# Patient Record
Sex: Female | Born: 1935 | Race: White | Hispanic: No | Marital: Married | Smoking: Current every day smoker
Health system: Southern US, Community
[De-identification: ages and names within clinical notes are randomized; demographics above are authoritative.]

## PROBLEM LIST (undated history)

## (undated) DIAGNOSIS — Z5189 Encounter for other specified aftercare: Secondary | ICD-10-CM

## (undated) DIAGNOSIS — M1990 Unspecified osteoarthritis, unspecified site: Secondary | ICD-10-CM

## (undated) DIAGNOSIS — IMO0001 Reserved for inherently not codable concepts without codable children: Secondary | ICD-10-CM

## (undated) DIAGNOSIS — H269 Unspecified cataract: Secondary | ICD-10-CM

## (undated) DIAGNOSIS — E785 Hyperlipidemia, unspecified: Secondary | ICD-10-CM

## (undated) HISTORY — DX: Unspecified cataract: H26.9

## (undated) HISTORY — DX: Encounter for other specified aftercare: Z51.89

## (undated) HISTORY — DX: Unspecified osteoarthritis, unspecified site: M19.90

## (undated) HISTORY — DX: Reserved for inherently not codable concepts without codable children: IMO0001

## (undated) HISTORY — DX: Hyperlipidemia, unspecified: E78.5

---

## 1983-12-01 HISTORY — PX: VAGINAL HYSTERECTOMY: SUR661

## 1998-09-27 ENCOUNTER — Other Ambulatory Visit: Admission: RE | Admit: 1998-09-27 | Discharge: 1998-09-27 | Payer: Self-pay | Admitting: Obstetrics and Gynecology

## 1999-10-29 ENCOUNTER — Other Ambulatory Visit: Admission: RE | Admit: 1999-10-29 | Discharge: 1999-10-29 | Payer: Self-pay | Admitting: Obstetrics and Gynecology

## 2000-08-27 ENCOUNTER — Encounter: Payer: Self-pay | Admitting: Internal Medicine

## 2000-08-27 ENCOUNTER — Encounter: Admission: RE | Admit: 2000-08-27 | Discharge: 2000-08-27 | Payer: Self-pay | Admitting: Internal Medicine

## 2000-11-11 ENCOUNTER — Other Ambulatory Visit: Admission: RE | Admit: 2000-11-11 | Discharge: 2000-11-11 | Payer: Self-pay | Admitting: Obstetrics and Gynecology

## 2001-08-12 ENCOUNTER — Encounter: Admission: RE | Admit: 2001-08-12 | Discharge: 2001-08-12 | Payer: Self-pay | Admitting: Internal Medicine

## 2001-08-12 ENCOUNTER — Encounter: Payer: Self-pay | Admitting: Internal Medicine

## 2001-11-11 ENCOUNTER — Other Ambulatory Visit: Admission: RE | Admit: 2001-11-11 | Discharge: 2001-11-11 | Payer: Self-pay | Admitting: Obstetrics and Gynecology

## 2002-09-05 ENCOUNTER — Other Ambulatory Visit: Admission: RE | Admit: 2002-09-05 | Discharge: 2002-09-05 | Payer: Self-pay | Admitting: Obstetrics and Gynecology

## 2004-06-18 ENCOUNTER — Other Ambulatory Visit: Admission: RE | Admit: 2004-06-18 | Discharge: 2004-06-18 | Payer: Self-pay | Admitting: Obstetrics and Gynecology

## 2004-10-07 ENCOUNTER — Encounter (INDEPENDENT_AMBULATORY_CARE_PROVIDER_SITE_OTHER): Payer: Self-pay | Admitting: *Deleted

## 2004-10-07 ENCOUNTER — Ambulatory Visit (HOSPITAL_COMMUNITY): Admission: RE | Admit: 2004-10-07 | Discharge: 2004-10-07 | Payer: Self-pay | Admitting: *Deleted

## 2006-10-07 ENCOUNTER — Other Ambulatory Visit: Admission: RE | Admit: 2006-10-07 | Discharge: 2006-10-07 | Payer: Self-pay | Admitting: Obstetrics and Gynecology

## 2009-05-15 ENCOUNTER — Other Ambulatory Visit: Admission: RE | Admit: 2009-05-15 | Discharge: 2009-05-15 | Payer: Self-pay | Admitting: Obstetrics and Gynecology

## 2009-05-15 ENCOUNTER — Encounter: Payer: Self-pay | Admitting: Obstetrics and Gynecology

## 2009-05-15 ENCOUNTER — Ambulatory Visit: Payer: Self-pay | Admitting: Obstetrics and Gynecology

## 2009-05-23 ENCOUNTER — Ambulatory Visit (HOSPITAL_COMMUNITY): Admission: RE | Admit: 2009-05-23 | Discharge: 2009-05-23 | Payer: Self-pay | Admitting: *Deleted

## 2009-05-23 ENCOUNTER — Encounter (INDEPENDENT_AMBULATORY_CARE_PROVIDER_SITE_OTHER): Payer: Self-pay | Admitting: *Deleted

## 2009-06-07 ENCOUNTER — Ambulatory Visit: Payer: Self-pay | Admitting: Obstetrics and Gynecology

## 2009-07-17 ENCOUNTER — Ambulatory Visit: Payer: Self-pay | Admitting: Obstetrics and Gynecology

## 2011-03-13 ENCOUNTER — Encounter (INDEPENDENT_AMBULATORY_CARE_PROVIDER_SITE_OTHER): Payer: Medicare Other | Admitting: Obstetrics and Gynecology

## 2011-03-13 DIAGNOSIS — N3941 Urge incontinence: Secondary | ICD-10-CM

## 2011-03-13 DIAGNOSIS — M810 Age-related osteoporosis without current pathological fracture: Secondary | ICD-10-CM

## 2011-03-13 DIAGNOSIS — N390 Urinary tract infection, site not specified: Secondary | ICD-10-CM

## 2011-03-13 DIAGNOSIS — N952 Postmenopausal atrophic vaginitis: Secondary | ICD-10-CM

## 2011-04-14 NOTE — Op Note (Signed)
NAMESABREE, NUON                ACCOUNT NO.:  [id number]   MEDICAL RECORD NO.:  [id number]          PATIENT TYPE:  AMB   LOCATION:  ENDO                         FACILITY:  [facility]   PHYSICIAN:  Georgiana Spinner, M.D.    DATE OF BIRTH:  December 16, 1935   DATE OF PROCEDURE:  05/23/2009  DATE OF DISCHARGE:                               OPERATIVE REPORT   PROCEDURE:  Colonoscopy.   INDICATIONS:  Rectal bleeding.   ANESTHESIA:  Fentanyl 100 mcg, Versed 10 mg.   PROCEDURE:  With the patient mildly sedated in the lateral decubitus  position, the Pentax videoscopic pediatric colonoscope was inserted in  the rectum and passed under direct vision with pressure applied to reach  the cecum, identified by ileocecal valve and appendiceal orifice, both  of which were photographed.  From this point, the colonoscope was slowly  withdrawn, taking circumferential views of colonic mucosa as we withdrew  all the way to the rectum, stopping first in the ascending colon where a  small polyp was seen, photographed, and removed using hot biopsy forceps  technique setting of 20/150 blended current.  We next stopped in the  descending colon distally where a polyp was seen.  It too was  photographed and it too was removed in the same manner.  The endoscope  was pulled back in the rectum which appeared normal on direct and showed  large hemorrhoids on retroflexed view.  The endoscope was straightened  and withdrawn.  The patient's vital signs and pulse oximeter remained  stable.  The patient tolerated the procedure well without apparent  complications.   FINDINGS:  Large internal hemorrhoids, quite possibly the cause of the  patient's rectal bleeding as noted clinically.   PLAN:  Await biopsy report.  The patient will call for results and  follow up with me as needed as an outpatient with consideration for  therapy directed at the hemorrhoids as needed.           ______________________________  Georgiana Spinner,  M.D.     GMO/MEDQ  D:  05/23/2009  T:  05/23/2009  Job:  [id number]

## 2011-04-17 NOTE — Op Note (Signed)
NAMEZIYA, COONROD                ACCOUNT NO.:  [id number]   MEDICAL RECORD NO.:  [id number]          PATIENT TYPE:  AMB   LOCATION:  ENDO                         FACILITY:  [facility]   PHYSICIAN:  Georgiana Spinner, M.D.    DATE OF BIRTH:  [birth year]   DATE OF PROCEDURE:  10/07/2004  DATE OF DISCHARGE:                                 OPERATIVE REPORT   PROCEDURE:  Colonoscopy.   INDICATIONS:  Rectal bleeding.   ANESTHESIA:  Demerol 20 mg, Versed 2 mg.   PROCEDURE:  With the patient mildly sedated in the left lateral decubitus  position, the Olympus videoscopic colonoscope was inserted into the rectum,  passed under direct vision to the cecum, identified by the ileocecal valve  and appendiceal orifice, both of which were photographed.  From this point  the colonoscope was slowly withdrawn, taking circumferential views of the  colonic mucosa, stopping only in the rectum, which appeared normal in direct  view but showed hemorrhoids in the anal canal and on retroflexed view.  The  endoscope was straightened and withdrawn.  The patient's vital signs and  pulse oximetry remained stable.  The patient tolerated the procedure well  without apparent complications.   FINDINGS:  Hemorrhoids, otherwise unremarkable examination.   PLAN:  See endoscopy note for further details.       GMO/MEDQ  D:  10/07/2004  T:  10/07/2004  Job:  [id number]

## 2011-04-17 NOTE — Op Note (Signed)
[name], Kara Campos                ACCOUNT NO.:  [id number]   MEDICAL RECORD NO.:  [id number]          PATIENT TYPE:  AMB   LOCATION:  ENDO                         FACILITY:  [facility]   PHYSICIAN:  Georgiana Spinner, M.D.    DATE OF BIRTH:  [birth year]   DATE OF PROCEDURE:  10/07/2004  DATE OF DISCHARGE:                                 OPERATIVE REPORT   PROCEDURE:  Upper endoscopy with biopsy.   INDICATIONS:  Abdominal pain.   ANESTHESIA:  Demerol 50 mg, Versed 5 mg.   PROCEDURE:  With the patient mildly sedated in the left lateral decubitus  position, the Olympus videoscopic endoscope was inserted in the mouth,  passed under direct vision through the esophagus, which appeared normal.  There was a small hiatal hernia which was photographed, but no evidence of  Barrett's esophagus was noted.  We entered into the stomach.  Fundus, body,  antrum, duodenal bulb, second portion of the duodenum were visualized.  From  this point the endoscope was slowly withdrawn taking circumferential views  of the duodenal mucosa until the endoscope had been pulled back into the  stomach, placed in retroflexion to view the stomach from below.  The  endoscope was straightened and withdrawn, taking circumferential views of  the remaining gastric and esophageal mucosa, stopping in the body and fundus  of the stomach, where changes of a diffuse carpet-like erythema were noted  and photographed and biopsied.  The patient's vital signs and pulse oximetry  remained stable.  The patient tolerated the procedure well without apparent  complications.   FINDINGS:  Erythematous changes of stomach, biopsied.   Await biopsy report.  The patient will call me for results and follow up  with me as an outpatient.  Proceed to colonoscopy as planned.       GMO/MEDQ  D:  10/07/2004  T:  10/07/2004  Job:  [id number]

## 2011-12-07 DIAGNOSIS — I10 Essential (primary) hypertension: Secondary | ICD-10-CM | POA: Diagnosis not present

## 2011-12-07 DIAGNOSIS — E789 Disorder of lipoprotein metabolism, unspecified: Secondary | ICD-10-CM | POA: Diagnosis not present

## 2011-12-07 DIAGNOSIS — E119 Type 2 diabetes mellitus without complications: Secondary | ICD-10-CM | POA: Diagnosis not present

## 2011-12-14 DIAGNOSIS — E7800 Pure hypercholesterolemia, unspecified: Secondary | ICD-10-CM | POA: Diagnosis not present

## 2011-12-14 DIAGNOSIS — R7309 Other abnormal glucose: Secondary | ICD-10-CM | POA: Diagnosis not present

## 2012-01-11 ENCOUNTER — Encounter (INDEPENDENT_AMBULATORY_CARE_PROVIDER_SITE_OTHER): Payer: Self-pay | Admitting: General Surgery

## 2012-01-11 ENCOUNTER — Ambulatory Visit (INDEPENDENT_AMBULATORY_CARE_PROVIDER_SITE_OTHER): Payer: Medicare Other | Admitting: General Surgery

## 2012-01-11 VITALS — BP 136/84 | HR 69 | Temp 97.4°F | Ht 61.0 in | Wt 113.0 lb

## 2012-01-11 DIAGNOSIS — K648 Other hemorrhoids: Secondary | ICD-10-CM | POA: Diagnosis not present

## 2012-01-11 MED ORDER — HYDROCORTISONE 2.5 % RE CREA: TOPICAL_CREAM | Freq: Two times a day (BID) | RECTAL | Status: AC

## 2012-01-11 MED ORDER — NYSTATIN 100000 UNIT/GM EX CREA: TOPICAL_CREAM | Freq: Two times a day (BID) | CUTANEOUS | Status: AC

## 2012-01-11 NOTE — Progress Notes (Signed)
Subjective:     Patient ID: Kara Campos, female   DOB: 02/13/1936, 76 y.o.   MRN: [id number]  HPI Pt did well after banding of hemorrhoids 1 year ago until 2 weeks ago.  She had an acute episode of pain, with a hard palpable hemorrhoid that she had to push back in.  Since that time, she has had irritation, burning, and itching around her anus.  Witch hazel wipes made the pain much worse.  She has been doing sitz baths.  She denies constipation, but does have multiple loose stools/day.    Review of Systems Otherwise negative.     Objective:   Physical Exam  Constitutional: She is oriented to person, place, and time. She appears well-developed and well-nourished. No distress.  HENT:  Head: Normocephalic and atraumatic.  Eyes: Pupils are equal, round, and reactive to light. No scleral icterus.  Cardiovascular: Normal rate.   Pulmonary/Chest: Effort normal. No respiratory distress.  Abdominal: Soft.  Genitourinary: Rectal exam shows internal hemorrhoid. Rectal exam shows no external hemorrhoid.          Circumferential excoriations and bright red skin around anus.  Musculoskeletal: Normal range of motion.  Neurological: She is alert and oriented to person, place, and time. Coordination normal.  Skin: Skin is warm and dry. She is not diaphoretic.  Psychiatric: She has a normal mood and affect. Her behavior is normal. Judgment and thought content normal.       Assessment/Plan:     Hemorrhoids, internal, with complication Pt with likely thrombosed hemorrhoid and prolapse 2 weeks ago from description Now pt with excoriated skin around anus posteriorly and anteriorly.  Injected posterior and anterior right hemorrhoid.  Will do nystatin cream to excoriated area and hold on hemorrhoid cream until skin is healed.    Pt with loose stools instead of hard stools.   Advised to clean with squirt bottle and/or wipes and to dry with blow dryer.  Will follow up in 4-6 weeks.

## 2012-01-11 NOTE — Patient Instructions (Signed)
Avoid wiping around your anus significantly  Use water bottle/sitz baths or wipes to clean  Use hair dryer to dry.    Apply nystatin cream around anus.  If no relief from the burning/itching in 1 week, switch to Desitin/A&D or similar over the counter zinc oxide cream  Do not use the anusol (hemorrhoid cream) until itching/burning resolved.

## 2012-01-11 NOTE — Assessment & Plan Note (Signed)
Pt with likely thrombosed hemorrhoid and prolapse 2 weeks ago from description Now pt with excoriated skin around anus posteriorly and anteriorly.  Injected posterior and anterior right hemorrhoid.  Will do nystatin cream to excoriated area and hold on hemorrhoid cream until skin is healed.    Pt with loose stools instead of hard stools.   Advised to clean with squirt bottle and/or wipes and to dry with blow dryer.  Will follow up in 4-6 weeks.

## 2012-02-08 DIAGNOSIS — N390 Urinary tract infection, site not specified: Secondary | ICD-10-CM | POA: Diagnosis not present

## 2012-02-08 DIAGNOSIS — R300 Dysuria: Secondary | ICD-10-CM | POA: Diagnosis not present

## 2012-02-09 ENCOUNTER — Telehealth (INDEPENDENT_AMBULATORY_CARE_PROVIDER_SITE_OTHER): Payer: Self-pay | Admitting: General Surgery

## 2012-02-09 NOTE — Telephone Encounter (Signed)
Pt calling with problems of area still red and inflammed.  She stopped the zinc oxide (too messy) and has been using the Nystatin and A&D ointment only.  While the skin is still red and excoriated, she was instructed not to use the proctosol.  She reports pain and itching really badly.  I recommended she reconsider the zinc oxide and generously apply to the excoriated skin to provide a complete barrier between the skin and any urine or feces.  The zinc properties will help heal the skin as well.  The patient stated she will try the zinc oxide again.

## 2012-02-25 ENCOUNTER — Encounter (INDEPENDENT_AMBULATORY_CARE_PROVIDER_SITE_OTHER): Payer: Medicare Other | Admitting: General Surgery

## 2012-02-29 ENCOUNTER — Encounter (INDEPENDENT_AMBULATORY_CARE_PROVIDER_SITE_OTHER): Payer: Self-pay | Admitting: General Surgery

## 2012-02-29 ENCOUNTER — Ambulatory Visit (INDEPENDENT_AMBULATORY_CARE_PROVIDER_SITE_OTHER): Payer: Medicare Other | Admitting: General Surgery

## 2012-02-29 VITALS — BP 138/73 | HR 68 | Temp 97.7°F | Ht 61.0 in | Wt 112.6 lb

## 2012-02-29 DIAGNOSIS — K648 Other hemorrhoids: Secondary | ICD-10-CM

## 2012-02-29 MED ORDER — HYDROCORTISONE ACETATE 25 MG RE SUPP: 25.0000 mg | Freq: Two times a day (BID) | RECTAL | Status: DC

## 2012-02-29 NOTE — Patient Instructions (Signed)
Use suppositories twice daily.  Use proctosol cream at the outside anus.  Continue nystatin cream to R buttock.

## 2012-02-29 NOTE — Progress Notes (Signed)
HISTORY: Pt is doing much better overall than at last visit.  She has been using zinc oxide barrier cream and nystatin to perianal area.  Has had difficulty with the proctosol applicator.     PERTINENT REVIEW OF SYSTEMS: Otherwise negative.     EXAM: Head: Normocephalic and atraumatic.  Eyes:  Conjunctivae are normal. Pupils are equal, round, and reactive to light. No scleral icterus.  Neck:  Normal range of motion. Neck supple. No tracheal deviation present. No thyromegaly present.  Resp: No respiratory distress, normal effort. Rectal:  Small circumferential external hemorrhoids.  On Anoscopy, large right sided internal hemorrhoids, injected.  Excoriations around anus are mostly gone.  Small amount of candidiasis on R buttock.   Neurological: Alert and oriented to person, place, and time. Coordination normal.  Skin: Skin is warm and dry. No rash noted. No diaphoretic. No erythema. No pallor.  Psychiatric: Normal mood and affect. Normal behavior. Judgment and thought content normal.      ASSESSMENT AND PLAN:   Hemorrhoids, internal, with complication Continue Proctosol at anus. Try suppositories for internal hemorrhoids. Follow up in 6-8 weeks.    Continue nystatin of candidiasis of R gluteal area.         Maudry Diego, MD Surgical Oncology, General & Endocrine Surgery [facility], Hubbard Hartshorn, MD, MD Massie Maroon., MD

## 2012-02-29 NOTE — Assessment & Plan Note (Signed)
Continue Proctosol at anus. Try suppositories for internal hemorrhoids. Follow up in 6-8 weeks.    Continue nystatin of candidiasis of R gluteal area.

## 2012-03-30 DIAGNOSIS — I10 Essential (primary) hypertension: Secondary | ICD-10-CM | POA: Diagnosis not present

## 2012-03-30 DIAGNOSIS — E7800 Pure hypercholesterolemia, unspecified: Secondary | ICD-10-CM | POA: Diagnosis not present

## 2012-04-04 DIAGNOSIS — I10 Essential (primary) hypertension: Secondary | ICD-10-CM | POA: Diagnosis not present

## 2012-04-04 DIAGNOSIS — R7309 Other abnormal glucose: Secondary | ICD-10-CM | POA: Diagnosis not present

## 2012-04-04 DIAGNOSIS — E7800 Pure hypercholesterolemia, unspecified: Secondary | ICD-10-CM | POA: Diagnosis not present

## 2012-04-04 DIAGNOSIS — E559 Vitamin D deficiency, unspecified: Secondary | ICD-10-CM | POA: Diagnosis not present

## 2012-04-21 DIAGNOSIS — N390 Urinary tract infection, site not specified: Secondary | ICD-10-CM | POA: Diagnosis not present

## 2012-04-21 DIAGNOSIS — R300 Dysuria: Secondary | ICD-10-CM | POA: Diagnosis not present

## 2012-04-27 DIAGNOSIS — R109 Unspecified abdominal pain: Secondary | ICD-10-CM | POA: Diagnosis not present

## 2012-05-04 ENCOUNTER — Encounter (INDEPENDENT_AMBULATORY_CARE_PROVIDER_SITE_OTHER): Payer: Self-pay | Admitting: General Surgery

## 2012-05-04 ENCOUNTER — Ambulatory Visit (INDEPENDENT_AMBULATORY_CARE_PROVIDER_SITE_OTHER): Payer: Medicare Other | Admitting: General Surgery

## 2012-05-04 VITALS — BP 126/80 | HR 66 | Temp 97.5°F | Resp 14 | Ht 61.0 in | Wt 113.6 lb

## 2012-05-04 DIAGNOSIS — K644 Residual hemorrhoidal skin tags: Secondary | ICD-10-CM | POA: Diagnosis not present

## 2012-05-04 NOTE — Progress Notes (Signed)
HISTORY: Pt has stalled and doesn't feel like she is improving.  She is not having bleeding, but continues to have itching and burning at her anus.  Her anus also feels swollen.  She has been doing suppositories in AM, and anusol in PM.  She has not been having as much itching on her buttocks.  She ran out of her nystatin, and has been using A&D ointment.     PERTINENT REVIEW OF SYSTEMS: Otherwise negative.     EXAM: Head: Normocephalic and atraumatic.  Eyes:  Conjunctivae are normal. Pupils are equal, round, and reactive to light. No scleral icterus.  Neck:  Normal range of motion. Neck supple. No tracheal deviation present. No thyromegaly present.  Resp: No respiratory distress, normal effort. Rectal: Small circumferential external hemorrhoids, swollen and inflamed.  Small amount of candidiasis on R buttock.   Neurological: Alert and oriented to person, place, and time. Coordination normal.  Skin: Skin is warm and dry. No rash noted. No diaphoretic. No erythema. No pallor.  Psychiatric: Normal mood and affect. Normal behavior. Judgment and thought content normal.      ASSESSMENT AND PLAN:   External hemorrhoids Now with circumferential external hemorrhoids.   All are small, but swollen.  Advised to use anusol TID and increase stool softeners.  Did not complain of bleeding or prolapse, so did not repeat anoscopy today.    Follow up in 6-8 weeks.    Area of candidiasis looks better.  Would continue A&D on gluteal skin.        Maudry Diego, MD Surgical Oncology, General & Endocrine Surgery [facility], Hubbard Hartshorn, MD, MD Massie Maroon., MD

## 2012-05-04 NOTE — Assessment & Plan Note (Signed)
Now with circumferential external hemorrhoids.   All are small, but swollen.  Advised to use anusol TID and increase stool softeners.  Did not complain of bleeding or prolapse, so did not repeat anoscopy today.    Follow up in 6-8 weeks.    Area of candidiasis looks better.  Would continue A&D on gluteal skin.

## 2012-05-04 NOTE — Patient Instructions (Signed)
Increase hydrocortisone cream to 3 times/day.  Increase stool softener to twice daily.  Follow up in 8 weeks.    Call if recurrent itching, we can refill nystatin.

## 2012-05-09 DIAGNOSIS — R350 Frequency of micturition: Secondary | ICD-10-CM | POA: Diagnosis not present

## 2012-05-09 DIAGNOSIS — N3020 Other chronic cystitis without hematuria: Secondary | ICD-10-CM | POA: Diagnosis not present

## 2012-05-26 DIAGNOSIS — H4011X0 Primary open-angle glaucoma, stage unspecified: Secondary | ICD-10-CM | POA: Diagnosis not present

## 2012-05-26 DIAGNOSIS — H409 Unspecified glaucoma: Secondary | ICD-10-CM | POA: Diagnosis not present

## 2012-06-24 ENCOUNTER — Encounter (INDEPENDENT_AMBULATORY_CARE_PROVIDER_SITE_OTHER): Payer: Self-pay | Admitting: General Surgery

## 2012-06-24 ENCOUNTER — Ambulatory Visit (INDEPENDENT_AMBULATORY_CARE_PROVIDER_SITE_OTHER): Payer: Medicare Other | Admitting: General Surgery

## 2012-06-24 VITALS — BP 148/86 | HR 72 | Temp 97.9°F | Resp 16 | Ht 61.0 in | Wt 112.8 lb

## 2012-06-24 DIAGNOSIS — K644 Residual hemorrhoidal skin tags: Secondary | ICD-10-CM | POA: Diagnosis not present

## 2012-06-24 DIAGNOSIS — B3789 Other sites of candidiasis: Secondary | ICD-10-CM | POA: Diagnosis not present

## 2012-06-24 DIAGNOSIS — R197 Diarrhea, unspecified: Secondary | ICD-10-CM | POA: Insufficient documentation

## 2012-06-24 MED ORDER — CLOTRIMAZOLE-BETAMETHASONE 1-0.05 % EX CREA: TOPICAL_CREAM | CUTANEOUS | Status: AC

## 2012-06-24 NOTE — Assessment & Plan Note (Signed)
?   Irritable bowel syndrome.  Related to mental stress and anxiety.  If no improvement, would refer to GI.

## 2012-06-24 NOTE — Patient Instructions (Signed)
Stop all other creams.  Apply clotrimazole to perianal area and gluteal region.  Follow up in 4-6 weeks.

## 2012-06-24 NOTE — Assessment & Plan Note (Signed)
Improved on the anusol cream.  Seems symptoms are more related to candidiasis.    Will hold anusol cream.

## 2012-06-24 NOTE — Assessment & Plan Note (Signed)
Pt with continued appearance of yeast.  Will switch to clotrimazole.  If no improvement, will refer to dermatology

## 2012-06-24 NOTE — Progress Notes (Signed)
HISTORY: Pt continues to have fiery pain on buttock and perianal area.  She is having less swelling. She continues to to have flaky skin and burning.  She has started to have diarrhea secondary to severe anxiety.     PERTINENT REVIEW OF SYSTEMS: Otherwise negative.     EXAM: Head: Normocephalic and atraumatic.  Eyes:  Conjunctivae are normal. Pupils are equal, round, and reactive to light. No scleral icterus.  Neck:  Normal range of motion. Neck supple. No tracheal deviation present. No thyromegaly present.  Resp: No respiratory distress, normal effort. Rectal: Small circumferential external hemorrhoids, much less inflamed.  .  Small amount of candidiasis on R buttock, more red than last eval.  .   Neurological: Alert and oriented to person, place, and time. Coordination normal.  Skin: Skin is warm and dry. No rash noted. No diaphoretic. No erythema. No pallor.  Psychiatric: Normal mood and affect. Normal behavior. Judgment and thought content normal.      ASSESSMENT AND PLAN:   Candidiasis of anus and buttock Pt with continued appearance of yeast.  Will switch to clotrimazole.  If no improvement, will refer to dermatology  External hemorrhoids Improved on the anusol cream.  Seems symptoms are more related to candidiasis.    Will hold anusol cream.    Diarrhea ? Irritable bowel syndrome.  Related to mental stress and anxiety.  If no improvement, would refer to GI.       Maudry Diego, MD Surgical Oncology, General & Endocrine Surgery [facility] Surgery, Hubbard Hartshorn, MD Massie Maroon., MD

## 2012-07-14 DIAGNOSIS — H2510 Age-related nuclear cataract, unspecified eye: Secondary | ICD-10-CM | POA: Diagnosis not present

## 2012-08-02 ENCOUNTER — Ambulatory Visit (INDEPENDENT_AMBULATORY_CARE_PROVIDER_SITE_OTHER): Payer: Medicare Other | Admitting: General Surgery

## 2012-08-02 ENCOUNTER — Encounter (INDEPENDENT_AMBULATORY_CARE_PROVIDER_SITE_OTHER): Payer: Self-pay | Admitting: General Surgery

## 2012-08-02 VITALS — BP 112/72 | HR 68 | Temp 97.2°F | Resp 18 | Ht 61.0 in | Wt 112.0 lb

## 2012-08-02 DIAGNOSIS — B3789 Other sites of candidiasis: Secondary | ICD-10-CM | POA: Diagnosis not present

## 2012-08-02 NOTE — Assessment & Plan Note (Signed)
Hold off on hemorrhoid cream for now.  May resume if symptoms flare up.

## 2012-08-02 NOTE — Assessment & Plan Note (Signed)
Continue lotrimin for another week.  Then switch to hydrating cream.    Follow up as needed.

## 2012-08-02 NOTE — Progress Notes (Signed)
HISTORY: Pt is doing better.  Pt has less itching and burning.  She is also not complaining of hemorrhoid discomfort.  [name]     PERTINENT REVIEW OF SYSTEMS: Otherwise negative.     EXAM: Head: Normocephalic and atraumatic.  Eyes:  Conjunctivae are normal. Pupils are equal, round, and reactive to light. No scleral icterus.  Neck:  Normal range of motion. Neck supple. No tracheal deviation present. No thyromegaly present.  Resp: No respiratory distress, normal effort. Rectal: Small circumferential external hemorrhoids, minimally inflamed.  Small amount of candidiasis on R buttock, more red than last eval.  .   Neurological: Alert and oriented to person, place, and time. Coordination normal.  Skin: Skin is warm and dry. Erythematous maculopapular rash over shins.  No diaphoretic. No erythema. No pallor.  Psychiatric: Normal mood and affect. Normal behavior. Judgment and thought content normal.      ASSESSMENT AND PLAN:   Hemorrhoids, internal, with complication Hold off on hemorrhoid cream for now.  May resume if symptoms flare up.    Candidiasis of anus and buttock Continue lotrimin for another week.  Then switch to hydrating cream.    Follow up as needed.        Maudry Diego, MD Surgical Oncology, General & Endocrine Surgery [facility] Surgery, Hubbard Hartshorn, MD Massie Maroon., MD

## 2012-08-02 NOTE — Patient Instructions (Signed)
See dermatologist.  Try hydrating lotion such as lotrimin.

## 2012-08-12 ENCOUNTER — Telehealth (INDEPENDENT_AMBULATORY_CARE_PROVIDER_SITE_OTHER): Payer: Self-pay

## 2012-08-12 NOTE — Telephone Encounter (Signed)
Pt has appt with Dr. Leonie Man at [facility] on 01/20/2013 at 2:20pm.  Pt is aware and may call to check for cancellations.

## 2012-08-17 DIAGNOSIS — Z23 Encounter for immunization: Secondary | ICD-10-CM | POA: Diagnosis not present

## 2012-10-06 DIAGNOSIS — R7309 Other abnormal glucose: Secondary | ICD-10-CM | POA: Diagnosis not present

## 2012-10-06 DIAGNOSIS — I10 Essential (primary) hypertension: Secondary | ICD-10-CM | POA: Diagnosis not present

## 2012-10-06 DIAGNOSIS — E559 Vitamin D deficiency, unspecified: Secondary | ICD-10-CM | POA: Diagnosis not present

## 2012-10-11 DIAGNOSIS — I10 Essential (primary) hypertension: Secondary | ICD-10-CM | POA: Diagnosis not present

## 2012-10-11 DIAGNOSIS — E7800 Pure hypercholesterolemia, unspecified: Secondary | ICD-10-CM | POA: Diagnosis not present

## 2012-10-11 DIAGNOSIS — R7309 Other abnormal glucose: Secondary | ICD-10-CM | POA: Diagnosis not present

## 2012-10-11 DIAGNOSIS — E559 Vitamin D deficiency, unspecified: Secondary | ICD-10-CM | POA: Diagnosis not present

## 2012-11-28 ENCOUNTER — Ambulatory Visit (INDEPENDENT_AMBULATORY_CARE_PROVIDER_SITE_OTHER): Payer: Medicare Other | Admitting: Gynecology

## 2012-11-28 ENCOUNTER — Encounter: Payer: Self-pay | Admitting: Gynecology

## 2012-11-28 VITALS — BP 118/78 | Ht 60.0 in | Wt 114.5 lb

## 2012-11-28 DIAGNOSIS — N952 Postmenopausal atrophic vaginitis: Secondary | ICD-10-CM | POA: Diagnosis not present

## 2012-11-28 DIAGNOSIS — R21 Rash and other nonspecific skin eruption: Secondary | ICD-10-CM | POA: Diagnosis not present

## 2012-11-28 DIAGNOSIS — M810 Age-related osteoporosis without current pathological fracture: Secondary | ICD-10-CM | POA: Diagnosis not present

## 2012-11-28 NOTE — Patient Instructions (Signed)
Call to Schedule your mammogram  Facilities in [place]: 1)  [facility] [place], [place] Phone: [phone] 2)  [facility], [phone] Phone: [phone] 3)  Dr. Yolanda Bonine at [facility] [address] Phone: [phone]     Mammogram A mammogram is an X-ray test to find changes in a woman's breast. You should get a mammogram if:  You are 23 years of age or older  You have risk factors.   Your doctor recommends that you have one.  BEFORE THE TEST  Do not schedule the test the week before your period, especially if your breasts are sore during this time.  On the day of your mammogram:  Wash your breasts and armpits well. After washing, do not put on any deodorant or talcum powder on until after your test.   Eat and drink as you usually do.   Take your medicines as usual.   If you are diabetic and take insulin, make sure you:   Eat before coming for your test.   Take your insulin as usual.   If you cannot keep your appointment, call before the appointment to cancel. Schedule another appointment.  TEST  You will need to undress from the waist up. You will put on a hospital gown.   Your breast will be put on the mammogram machine, and it will press firmly on your breast with a piece of plastic called a compression paddle. This will make your breast flatter so that the machine can X-ray all parts of your breast.   Both breasts will be X-rayed. Each breast will be X-rayed from above and from the side. An X-ray might need to be taken again if the picture is not good enough.   The mammogram will last about 15 to 30 minutes.  AFTER THE TEST Finding out the results of your test Ask when your test results will be ready. Make sure you get your test results.  Document Released: 02/12/2009 Document Revised: 11/05/2011 Document Reviewed: 02/12/2009 [facility] Patient  Information 2012 [place], [place]. Consider Stop Smoking.  Help is available at [facility] smoking cessation program @ www.[place].com or (575)815-1113. OR 1-800-QUIT-NOW 872-188-9033) for free smoking cessation counseling.  Smokefree.gov (http://www.davis-sullivan.com/) provides free, accurate, evidence-based information and professional assistance to help support the immediate and long-term needs of people trying to quit smoking.    Smoking Hazards Smoking cigarettes is extremely bad for your health. Tobacco smoke has over 200 known poisons in it. There are over 60 chemicals in tobacco smoke that cause cancer. Some of the chemicals found in cigarette smoke include:  Cyanide.  Benzene.  Formaldehyde.  Methanol (wood alcohol).  Acetylene (fuel used in welding torches).  Ammonia.  Cigarette smoke also contains the poisonous gases nitrogen oxide and carbon monoxide.  Cigarette smokers have an increased risk of many serious medical problems, including: Lung cancer.  Lung disease (such as pneumonia, bronchitis, and emphysema).  Heart attack and chest pain due to the heart not getting enough oxygen (angina).  Heart disease and peripheral blood vessel disease.  Hypertension.  Stroke.  Oral cancer (cancer of the lip, mouth, or voice box).  Bladder cancer.  Pancreatic cancer.  Cervical cancer.  Pregnancy complications, including premature birth.  Low birthweight babies.  Early menopause.  Lower estrogen level for women.  Infertility.  Facial wrinkles.  Blindness.  Increased risk of broken bones (fractures).  Senile dementia.  Stillbirths and smaller newborn babies, birth defects, and genetic damage to sperm.  Stomach ulcers and internal bleeding.  Children of smokers have an increased risk of the following, because of secondhand smoke exposure:  Sudden infant death syndrome (SIDS).  Respiratory infections.  Lung cancer.  Heart disease.  Ear infections.  Smoking causes  approximately: 90% of all lung cancer deaths in men.  80% of all lung cancer deaths in women.  90% of deaths from chronic obstructive lung disease.  Compared with nonsmokers, smoking increases the risk of: Coronary heart disease by 2 to 4 times.  Stroke by 2 to 4 times.  Men developing lung cancer by 23 times.  Women developing lung cancer by 13 times.  Dying from chronic obstructive lung diseases by 12 times.  Someone who smokes 2 packs a day loses about 8 years of his or her life. Even smoking lightly shortens your life expectancy by several years. You can greatly reduce the risk of medical problems for you and your family by stopping now. Smoking is the most preventable cause of death and disease in our society. Within days of quitting smoking, your circulation returns to normal, you decrease the risk of having a heart attack, and your lung capacity improves. There may be some increased phlegm in the first few days after quitting, and it may take months for your lungs to clear up completely. Quitting for 10 years cuts your lung cancer risk to almost that of a nonsmoker. WHY IS SMOKING ADDICTIVE? Nicotine is the chemical agent in tobacco that is capable of causing addiction or dependence.  When you smoke and inhale, nicotine is absorbed rapidly into the bloodstream through your lungs. Nicotine absorbed through the lungs is capable of creating a powerful addiction. Both inhaled and non-inhaled nicotine may be addictive.  Addiction studies of cigarettes and spit tobacco show that addiction to nicotine occurs mainly during the teen years, when young people begin using tobacco products.  WHAT ARE THE BENEFITS OF QUITTING?  There are many health benefits to quitting smoking.  Likelihood of developing cancer and heart disease decreases. Health improvements are seen almost immediately.  Blood pressure, pulse rate, and breathing patterns start returning to normal soon after quitting.  People who quit  may see an improvement in their overall quality of life.  Some people choose to quit all at once. Other options include nicotine replacement products, such as patches, gum, and nasal sprays. Do not use these products without first checking with your caregiver. QUITTING SMOKING It is not easy to quit smoking. Nicotine is addicting, and longtime habits are hard to change. To start, you can write down all your reasons for quitting, tell your family and friends you want to quit, and ask for their help. Throw your cigarettes away, chew gum or cinnamon sticks, keep your hands busy, and drink extra water or juice. Go for walks and practice deep breathing to relax. Think of all the money you are saving: around $1,000 a year, for the average pack-a-day smoker. Nicotine patches and gum have been shown to improve success at efforts to stop smoking. Zyban (bupropion) is an anti-depressant drug that can be prescribed to reduce nicotine withdrawal symptoms and to suppress the urge to smoke. Smoking is an addiction with both physical and psychological effects. Joining a stop-smoking support group can help you cope with the emotional issues. For more information and advice on programs to stop smoking, call your doctor, your local hospital, or these organizations: American Lung Association -  1-800-LUNGUSA   Smoking Cessation  This document explains the best ways for you to quit smoking and new treatments to help. It lists new medicines that can double or triple your chances of quitting and quitting for good. It also considers ways to avoid relapses and concerns you may have about quitting, including weight gain. NICOTINE: A POWERFUL ADDICTION If you have tried to quit smoking, you know how hard it can be. It is hard because nicotine is a very addictive drug. For some people, it can be as addictive as heroin or cocaine. Usually, people make 2 or 3 tries, or more, before finally being able to quit. Each time you try to  quit, you can learn about what helps and what hurts. Quitting takes hard work and a lot of effort, but you can quit smoking. QUITTING SMOKING IS ONE OF THE MOST IMPORTANT THINGS YOU WILL EVER DO.  You will live longer, feel better, and live better.   The impact on your body of quitting smoking is felt almost immediately:   Within 20 minutes, blood pressure decreases. Pulse returns to its normal level.   After 8 hours, carbon monoxide levels in the blood return to normal. Oxygen level increases.   After 24 hours, chance of heart attack starts to decrease. Breath, hair, and body stop smelling like smoke.   After 48 hours, damaged nerve endings begin to recover. Sense of taste and smell improve.   After 72 hours, the body is virtually free of nicotine. Bronchial tubes relax and breathing becomes easier.   After 2 to 12 weeks, lungs can hold more air. Exercise becomes easier and circulation improves.   Quitting will reduce your risk of having a heart attack, stroke, cancer, or lung disease:   After 1 year, the risk of coronary heart disease is cut in half.   After 5 years, the risk of stroke falls to the same as a nonsmoker.   After 10 years, the risk of lung cancer is cut in half and the risk of other cancers decreases significantly.   After 15 years, the risk of coronary heart disease drops, usually to the level of a nonsmoker.   If you are pregnant, quitting smoking will improve your chances of having a healthy baby.   The people you live with, especially your children, will be healthier.   You will have extra money to spend on things other than cigarettes.  FIVE KEYS TO QUITTING Studies have shown that these 5 steps will help you quit smoking and quit for good. You have the best chances of quitting if you use them together: 1. Get ready.  2. Get support and encouragement.  3. Learn new skills and behaviors.  4. Get medicine to reduce your nicotine addiction and use it  correctly.  5. Be prepared for relapse or difficult situations. Be determined to continue trying to quit, even if you do not succeed at first.  1. GET READY  Set a quit date.   Change your environment.   Get rid of ALL cigarettes, ashtrays, matches, and lighters in your home, car, and place of work.   Do not let people smoke in your home.   Review your past attempts to quit. Think about what worked and what did not.   Once you quit, do not smoke. NOT EVEN A PUFF!  2. GET SUPPORT AND ENCOURAGEMENT Studies have shown that you have a better chance of being successful if you have help. You can get support  in many ways.  Tell your family, friends, and coworkers that you are going to quit and need their support. Ask them not to smoke around you.   Talk to your caregivers (doctor, dentist, nurse, pharmacist, psychologist, and/or smoking counselor).   Get individual, group, or telephone counseling and support. The more counseling you have, the better your chances are of quitting. Programs are available at [employer] and health centers. Call your local health department for information about programs in your area.   Spiritual beliefs and practices may help some smokers quit.   Quit meters are Photographer that keep track of quit statistics, such as amount of "quit-time," cigarettes not smoked, and money saved.   Many smokers find one or more of the many self-help books available useful in helping them quit and stay off tobacco.  3. LEARN NEW SKILLS AND BEHAVIORS  Try to distract yourself from urges to smoke. Talk to someone, go for a walk, or occupy your time with a task.   When you first try to quit, change your routine. Take a different route to work. Drink tea instead of coffee. Eat breakfast in a different place.   Do something to reduce your stress. Take a hot bath, exercise, or read a book.   Plan something enjoyable to do every day. Reward  yourself for not smoking.   Explore interactive web-based programs that specialize in helping you quit.  4. GET MEDICINE AND USE IT CORRECTLY Medicines can help you stop smoking and decrease the urge to smoke. Combining medicine with the above behavioral methods and support can quadruple your chances of successfully quitting smoking. The U.S. Food and Drug Administration (FDA) has approved 7 medicines to help you quit smoking. These medicines fall into 3 categories.  Nicotine replacement therapy (delivers nicotine to your body without the negative effects and risks of smoking):   Nicotine gum: Available over-the-counter.   Nicotine lozenges: Available over-the-counter.   Nicotine inhaler: Available by prescription.   Nicotine nasal spray: Available by prescription.   Nicotine skin patches (transdermal): Available by prescription and over-the-counter.   Antidepressant medicine (helps people abstain from smoking, but how this works is unknown):   Bupropion sustained-release (SR) tablets: Available by prescription.   Nicotinic receptor partial agonist (simulates the effect of nicotine in your brain):   Varenicline tartrate tablets: Available by prescription.   Ask your caregiver for advice about which medicines to use and how to use them. Carefully read the information on the package.   Everyone who is trying to quit may benefit from using a medicine. If you are pregnant or trying to become pregnant, nursing an infant, you are under age 22, or you smoke fewer than 10 cigarettes per day, talk to your caregiver before taking any nicotine replacement medicines.   You should stop using a nicotine replacement product and call your caregiver if you experience nausea, dizziness, weakness, vomiting, fast or irregular heartbeat, mouth problems with the lozenge or gum, or redness or swelling of the skin around the patch that does not go away.   Do not use any other product containing nicotine  while using a nicotine replacement product.   Talk to your caregiver before using these products if you have diabetes, heart disease, asthma, stomach ulcers, you had a recent heart attack, you have high blood pressure that is not controlled with medicine, a history of irregular heartbeat, or you have been prescribed medicine to help you quit smoking.  5.  BE PREPARED FOR RELAPSE OR DIFFICULT SITUATIONS  Most relapses occur within the first 3 months after quitting. Do not be discouraged if you start smoking again. Remember, most people try several times before they finally quit.   You may have symptoms of withdrawal because your body is used to nicotine. You may crave cigarettes, be irritable, feel very hungry, cough often, get headaches, or have difficulty concentrating.   The withdrawal symptoms are only temporary. They are strongest when you first quit, but they will go away within 10 to 14 days.  Here are some difficult situations to watch for:  Alcohol. Avoid drinking alcohol. Drinking lowers your chances of successfully quitting.   Caffeine. Try to reduce the amount of caffeine you consume. It also lowers your chances of successfully quitting.   Other smokers. Being around smoking can make you want to smoke. Avoid smokers.   Weight gain. Many smokers will gain weight when they quit, usually less than 10 pounds. Eat a healthy diet and stay active. Do not let weight gain distract you from your main goal, quitting smoking. Some medicines that help you quit smoking may also help delay weight gain. You can always lose the weight gained after you quit.   Bad mood or depression. There are a lot of ways to improve your mood other than smoking.  If you are having problems with any of these situations, talk to your caregiver. SPECIAL SITUATIONS AND CONDITIONS Studies suggest that everyone can quit smoking. Your situation or condition can give you a special reason to quit.  Pregnant women/new  mothers: By quitting, you protect your baby's health and your own.   Hospitalized patients: By quitting, you reduce health problems and help healing.   Heart attack patients: By quitting, you reduce your risk of a second heart attack.   Lung, head, and neck cancer patients: By quitting, you reduce your chance of a second cancer.   Parents of children and adolescents: By quitting, you protect your children from illnesses caused by secondhand smoke.  QUESTIONS TO THINK ABOUT Think about the following questions before you try to stop smoking. You may want to talk about your answers with your caregiver.  Why do you want to quit?   If you tried to quit in the past, what helped and what did not?   What will be the most difficult situations for you after you quit? How will you plan to handle them?   Who can help you through the tough times? Your family? Friends? Caregiver?   What pleasures do you get from smoking? What ways can you still get pleasure if you quit?  Here are some questions to ask your caregiver:  How can you help me to be successful at quitting?   What medicine do you think would be best for me and how should I take it?   What should I do if I need more help?   What is smoking withdrawal like? How can I get information on withdrawal?  Quitting takes hard work and a lot of effort, but you can quit smoking.

## 2012-11-28 NOTE — Progress Notes (Signed)
Kara Campos 07/27/1936 [id number]        76 y.o.  G2P2 for follow up exam.  Patient Dr. Eda Paschal is with several issues noted below.  Past medical history,surgical history, medications, allergies, family history and social history were all reviewed and documented in the EPIC chart. ROS:  Was performed and pertinent positives and negatives are included in the history.  Exam: [facility] Vitals:   11/28/12 1402  BP: 118/78  Height: 5' (1.524 m)  Weight: 114 lb 8 oz (51.937 kg)   General appearance  Normal Skin erythematous papular rash extensor surfaces of lower legs. Head/Neck normal with no cervical or supraclavicular adenopathy thyroid normal Lungs  clear Cardiac RR, without RMG Abdominal  soft, nontender, without masses, organomegaly or hernia Breasts  examined lying and sitting without masses, retractions, discharge or axillary adenopathy. Pelvic  Ext/BUS/vagina  Atrophic changes  Adnexa  Without masses or tenderness    Anus and perineum  normal   Rectovaginal  normal sphincter tone without palpated masses or tenderness.    Assessment/Plan:  76 y.o. G2P2 female for follow up exam.   1. Skin rash lower extremity extensor surfaces. Has been present for 6 months. She's tried the variety of creams and lotions to include steroid creams. Has appoint to see a dermatologist but not for 2 months. Slightly itchy but not overly symptomatic. Rash is limited to the lower extremities. We'll try to accelerate her dermatology appointment as she really needs to see a dermatologist. 2. Atrophic genital changes. Patient is asymptomatic and we'll continue to monitor. 3. Mammography 2012. Patient's overdo it she knows this. The need to schedule now was emphasized and she agrees to do so. SBE monthly reviewed. 4. Pap smear 2010. No Pap smear done today.  History of CIN proceeding hysterectomy in 1985. Pap smears have been normal since then.  She is status post hysterectomy and over the  age of 21 and I reviewed current screening guidelines we both agreed to stop screening. 5. Osteoporosis. DEXA 2010 showed T score -2.7. Patient has been tried on several oral bisphosphonates including Fosamax Actonel and Boniva. She did not tolerate any of these from a GI side effect as well as musculoskeletal aching. Had been counseled for alternatives a Dr. Eda Paschal to include Reclast Prolia Forteo. I again reviewed with her the options and she is not interested in pursuing anything at this point. The potential for a devastating fracture was reviewed with her. Patient states she is planning repeat DEXA at her primary physician's office the beginning of this coming year and will follow up with them in reference to this. Increase calcium and vitamin D was also reviewed with her. 6. Colonoscopy 2010. We'll follow up with them at recommended intervals. 7. Stop smoking discussed. 8. Health maintenance. No blood work done as it is all done through her primary physician's office who she sees on a regular basis. Follow up in one year, sooner as needed.    Dara Lords MD, 2:30 PM 11/28/2012

## 2012-11-29 ENCOUNTER — Telehealth: Payer: Self-pay | Admitting: *Deleted

## 2012-11-29 LAB — URINALYSIS W MICROSCOPIC + REFLEX CULTURE
Bacteria, UA: NONE SEEN
Casts: NONE SEEN
Glucose, UA: NEGATIVE mg/dL
Hgb urine dipstick: NEGATIVE
Ketones, ur: NEGATIVE mg/dL
Leukocytes, UA: NEGATIVE
pH: 6 (ref 5.0–8.0)

## 2012-11-29 NOTE — Telephone Encounter (Signed)
Message copied by Aura Camps on Tue Nov 29, 2012 10:53 AM ------      Message from: Dara Lords      Created: Mon Nov 28, 2012  2:38 PM       Schedule dermatology appointment as soon as possible with who ever can see her. Reference lower extremity rash persistent times months.

## 2012-11-29 NOTE — Telephone Encounter (Signed)
Pt informed with the below note. 

## 2012-11-29 NOTE — Telephone Encounter (Signed)
Left message for pt to call, pt appointment on feb 4th 2014. With Dr.Lupton @ 10:10am notes faxed.

## 2012-12-16 DIAGNOSIS — Z1231 Encounter for screening mammogram for malignant neoplasm of breast: Secondary | ICD-10-CM | POA: Diagnosis not present

## 2012-12-19 ENCOUNTER — Encounter: Payer: Self-pay | Admitting: Gynecology

## 2013-01-19 DIAGNOSIS — H409 Unspecified glaucoma: Secondary | ICD-10-CM | POA: Diagnosis not present

## 2013-01-19 DIAGNOSIS — H4011X0 Primary open-angle glaucoma, stage unspecified: Secondary | ICD-10-CM | POA: Diagnosis not present

## 2013-01-20 DIAGNOSIS — L259 Unspecified contact dermatitis, unspecified cause: Secondary | ICD-10-CM | POA: Diagnosis not present

## 2013-01-20 DIAGNOSIS — L723 Sebaceous cyst: Secondary | ICD-10-CM | POA: Diagnosis not present

## 2013-01-20 DIAGNOSIS — L538 Other specified erythematous conditions: Secondary | ICD-10-CM | POA: Diagnosis not present

## 2013-02-17 DIAGNOSIS — L259 Unspecified contact dermatitis, unspecified cause: Secondary | ICD-10-CM | POA: Diagnosis not present

## 2013-02-17 DIAGNOSIS — L538 Other specified erythematous conditions: Secondary | ICD-10-CM | POA: Diagnosis not present

## 2013-03-09 ENCOUNTER — Other Ambulatory Visit (INDEPENDENT_AMBULATORY_CARE_PROVIDER_SITE_OTHER): Payer: Self-pay | Admitting: General Surgery

## 2013-04-04 ENCOUNTER — Encounter (INDEPENDENT_AMBULATORY_CARE_PROVIDER_SITE_OTHER): Payer: Self-pay | Admitting: General Surgery

## 2013-04-04 ENCOUNTER — Ambulatory Visit (INDEPENDENT_AMBULATORY_CARE_PROVIDER_SITE_OTHER): Payer: Medicare Other | Admitting: General Surgery

## 2013-04-04 VITALS — BP 140/84 | HR 78 | Temp 96.7°F | Ht 61.0 in | Wt 115.4 lb

## 2013-04-04 DIAGNOSIS — K648 Other hemorrhoids: Secondary | ICD-10-CM

## 2013-04-04 MED ORDER — HYDROCORTISONE ACETATE 25 MG RE SUPP: 25.0000 mg | Freq: Two times a day (BID) | RECTAL | Status: AC

## 2013-04-04 NOTE — Patient Instructions (Addendum)
Use suppositories twice daily.    Use vaseline on outside.  Minimize trauma to area.

## 2013-04-06 NOTE — Progress Notes (Signed)
HISTORY: Pt was doing better. She saw dermatologist for scaly area on buttock.  That has improved.  She is, however, having symptoms of pressure and bleeding again.       PERTINENT REVIEW OF SYSTEMS: Otherwise negative x 11.     EXAM: Head: Normocephalic and atraumatic.  Eyes:  Conjunctivae are normal. Pupils are equal, round, and reactive to light. No scleral icterus.  Neck:  Normal range of motion. Neck supple. No tracheal deviation present. No thyromegaly present.  Resp: No respiratory distress, normal effort. Rectal: Small circumferential external hemorrhoids, minimally inflamed.  Right buttock area improved.  Anoscopy reveals circumferential internal hemorrhoids.  These are injected.   Neurological: Alert and oriented to person, place, and time. Coordination normal.  Skin: Skin is warm and dry. Erythematous maculopapular rash over shins.  No diaphoretic. No erythema. No pallor.  Psychiatric: Normal mood and affect. Normal behavior. Judgment and thought content normal.      ASSESSMENT AND PLAN:   Internal hemorrhoids with complication Pt with pressure and some bleeding.  Rash on buttock is improved.   Faint scaly skin over anus and small external hemorrhoids improved  Injected internal hemorrhoids.  Pt to do suppositories if injections do not improve symptoms.           Maudry Diego, MD Surgical Oncology, General & Endocrine Surgery [facility], [place], MD Massie Maroon., MD

## 2013-04-06 NOTE — Assessment & Plan Note (Addendum)
Pt with pressure and some bleeding.  Rash on buttock is improved.   Faint scaly skin over anus and small external hemorrhoids improved  Injected internal hemorrhoids.  Pt to do suppositories if injections do not improve symptoms.

## 2013-04-13 DIAGNOSIS — I10 Essential (primary) hypertension: Secondary | ICD-10-CM | POA: Diagnosis not present

## 2013-04-13 DIAGNOSIS — R7309 Other abnormal glucose: Secondary | ICD-10-CM | POA: Diagnosis not present

## 2013-04-13 DIAGNOSIS — E559 Vitamin D deficiency, unspecified: Secondary | ICD-10-CM | POA: Diagnosis not present

## 2013-04-18 DIAGNOSIS — L259 Unspecified contact dermatitis, unspecified cause: Secondary | ICD-10-CM | POA: Diagnosis not present

## 2013-04-18 DIAGNOSIS — E559 Vitamin D deficiency, unspecified: Secondary | ICD-10-CM | POA: Diagnosis not present

## 2013-04-18 DIAGNOSIS — I10 Essential (primary) hypertension: Secondary | ICD-10-CM | POA: Diagnosis not present

## 2013-04-18 DIAGNOSIS — R7309 Other abnormal glucose: Secondary | ICD-10-CM | POA: Diagnosis not present

## 2013-05-02 ENCOUNTER — Encounter: Payer: Self-pay | Admitting: Gynecology

## 2013-05-31 DIAGNOSIS — H409 Unspecified glaucoma: Secondary | ICD-10-CM | POA: Diagnosis not present

## 2013-05-31 DIAGNOSIS — H4011X0 Primary open-angle glaucoma, stage unspecified: Secondary | ICD-10-CM | POA: Diagnosis not present

## 2013-06-12 DIAGNOSIS — R300 Dysuria: Secondary | ICD-10-CM | POA: Diagnosis not present

## 2013-06-12 DIAGNOSIS — N390 Urinary tract infection, site not specified: Secondary | ICD-10-CM | POA: Diagnosis not present

## 2013-07-13 DIAGNOSIS — E559 Vitamin D deficiency, unspecified: Secondary | ICD-10-CM | POA: Diagnosis not present

## 2013-07-13 DIAGNOSIS — R7309 Other abnormal glucose: Secondary | ICD-10-CM | POA: Diagnosis not present

## 2013-07-13 DIAGNOSIS — I10 Essential (primary) hypertension: Secondary | ICD-10-CM | POA: Diagnosis not present

## 2013-07-19 DIAGNOSIS — I10 Essential (primary) hypertension: Secondary | ICD-10-CM | POA: Diagnosis not present

## 2013-07-19 DIAGNOSIS — R7309 Other abnormal glucose: Secondary | ICD-10-CM | POA: Diagnosis not present

## 2013-07-19 DIAGNOSIS — E7800 Pure hypercholesterolemia, unspecified: Secondary | ICD-10-CM | POA: Diagnosis not present

## 2013-07-19 DIAGNOSIS — E559 Vitamin D deficiency, unspecified: Secondary | ICD-10-CM | POA: Diagnosis not present

## 2013-08-12 DIAGNOSIS — Z23 Encounter for immunization: Secondary | ICD-10-CM | POA: Diagnosis not present

## 2013-10-25 DIAGNOSIS — R7309 Other abnormal glucose: Secondary | ICD-10-CM | POA: Diagnosis not present

## 2013-10-25 DIAGNOSIS — E559 Vitamin D deficiency, unspecified: Secondary | ICD-10-CM | POA: Diagnosis not present

## 2013-12-04 ENCOUNTER — Ambulatory Visit (INDEPENDENT_AMBULATORY_CARE_PROVIDER_SITE_OTHER): Payer: Medicare Other | Admitting: Gynecology

## 2013-12-04 ENCOUNTER — Encounter: Payer: Self-pay | Admitting: Gynecology

## 2013-12-04 VITALS — BP 112/66 | Ht 60.0 in | Wt 115.0 lb

## 2013-12-04 DIAGNOSIS — N952 Postmenopausal atrophic vaginitis: Secondary | ICD-10-CM | POA: Diagnosis not present

## 2013-12-04 DIAGNOSIS — M810 Age-related osteoporosis without current pathological fracture: Secondary | ICD-10-CM | POA: Diagnosis not present

## 2013-12-04 NOTE — Progress Notes (Signed)
Kara Campos 05/28/1936 [id number]        77 y.o.  G2P2 for followup exam.  Several issues below.  Past medical history,surgical history, problem list, medications, allergies, family history and social history were all reviewed and documented in the EPIC chart.  ROS:  Performed and pertinent positives and negatives are included in the history, assessment and plan .  Exam: Kim assistant Filed Vitals:   12/04/13 1358  BP: 112/66  Height: 5' (1.524 m)  Weight: 115 lb (52.164 kg)   General appearance  Normal Skin grossly normal Head/Neck normal with no cervical or supraclavicular adenopathy thyroid normal Lungs  clear Cardiac RR, without RMG Abdominal  soft, nontender, without masses, organomegaly or hernia Breasts  examined lying and sitting without masses, retractions, discharge or axillary adenopathy. Pelvic  Ext/BUS/vagina  Normal with general atrophic changes  Adnexa  Without masses or tenderness    Anus and perineum  Normal   Rectovaginal  Normal sphincter tone without palpated masses or tenderness.    Assessment/Plan:  78 y.o. G2P2 female for annual exam.   1. Postmenopausal/atrophic genital changes. Patient's asymptomatic without significant hot flushes night sweats vaginal dryness or dyspareunia. Will continue to monitor. 2. Osteoporosis. DEXA 2010 with T score -2.7. Had been tried on several oral bisphosphonates but could not tolerate these from a GI standpoint as well as a musculoskeletal aching. Had been counseled by Dr. Cherylann Banas previously for alternatives and I again reviewed all of them today to include Reclast Prolia and Forteo. Patient was to have repeat DEXA at her primary physician's office but never followed up for this. Recommended repeat DEXA now and my strong recommendation that she start alternative therapy such as Prolia. Risks of osteonecrosis of the jaw, atypical fractures, rashes and infections reviewed. She does smoke and admits to a fall recently and I  reviewed her high risk of fracture in the potential devastating sequelae. Patient agrees to treatment and we'll go ahead and move towards arranging for Prolia. She'll followup for the bone density also. 3. Pap smear 2010. No Pap smear done today. 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 Reviewed current screening guidelines we elected to stop screening due to her age and hysterectomy history and she is comfortable with this. 4. Mammography coming due this month I reminded her to schedule it. SBE monthly reviewed. 5. Colonoscopy 2010. Repeat at their recommended interval. 6. Stop smoking again reviewed with her and its contributory risk as far as osteoporosis. Patient states that she is in the process of trying to quit this year. 7. Health maintenance. No blood work done today as it is done through her primary physician's office. Followup for bone density, otherwise one year, sooner as needed.   Note: This document was prepared with digital dictation and possible smart phrase technology. Any transcriptional errors that result from this process are unintentional.   Anastasio Auerbach MD, 2:29 PM 12/04/2013

## 2013-12-04 NOTE — Patient Instructions (Signed)
Followup for bone density as scheduled. Office will contact you to arrange for Prolia injection. Continue with your attempts to stop smoking. Followup in one year for annual exam.

## 2013-12-05 LAB — URINALYSIS W MICROSCOPIC + REFLEX CULTURE
BILIRUBIN URINE: NEGATIVE
Bacteria, UA: NONE SEEN
CASTS: NONE SEEN
CRYSTALS: NONE SEEN
Glucose, UA: NEGATIVE mg/dL
Hgb urine dipstick: NEGATIVE
KETONES UR: NEGATIVE mg/dL
Leukocytes, UA: NEGATIVE
NITRITE: NEGATIVE
PH: 5.5 (ref 5.0–8.0)
Protein, ur: NEGATIVE mg/dL
SPECIFIC GRAVITY, URINE: 1.016 (ref 1.005–1.030)
UROBILINOGEN UA: 0.2 mg/dL (ref 0.0–1.0)

## 2013-12-07 DIAGNOSIS — H2510 Age-related nuclear cataract, unspecified eye: Secondary | ICD-10-CM | POA: Diagnosis not present

## 2013-12-07 DIAGNOSIS — H409 Unspecified glaucoma: Secondary | ICD-10-CM | POA: Diagnosis not present

## 2013-12-07 DIAGNOSIS — H4011X0 Primary open-angle glaucoma, stage unspecified: Secondary | ICD-10-CM | POA: Diagnosis not present

## 2014-01-29 DIAGNOSIS — R7309 Other abnormal glucose: Secondary | ICD-10-CM | POA: Diagnosis not present

## 2014-01-29 DIAGNOSIS — E559 Vitamin D deficiency, unspecified: Secondary | ICD-10-CM | POA: Diagnosis not present

## 2014-01-29 DIAGNOSIS — I10 Essential (primary) hypertension: Secondary | ICD-10-CM | POA: Diagnosis not present

## 2014-02-01 DIAGNOSIS — M899 Disorder of bone, unspecified: Secondary | ICD-10-CM | POA: Diagnosis not present

## 2014-02-01 DIAGNOSIS — I10 Essential (primary) hypertension: Secondary | ICD-10-CM | POA: Diagnosis not present

## 2014-02-01 DIAGNOSIS — R7309 Other abnormal glucose: Secondary | ICD-10-CM | POA: Diagnosis not present

## 2014-02-01 DIAGNOSIS — M949 Disorder of cartilage, unspecified: Secondary | ICD-10-CM | POA: Diagnosis not present

## 2014-02-01 DIAGNOSIS — E7800 Pure hypercholesterolemia, unspecified: Secondary | ICD-10-CM | POA: Diagnosis not present

## 2014-03-21 DIAGNOSIS — R5381 Other malaise: Secondary | ICD-10-CM | POA: Diagnosis not present

## 2014-03-21 DIAGNOSIS — L259 Unspecified contact dermatitis, unspecified cause: Secondary | ICD-10-CM | POA: Diagnosis not present

## 2014-03-21 DIAGNOSIS — R5383 Other fatigue: Secondary | ICD-10-CM | POA: Diagnosis not present

## 2014-03-21 DIAGNOSIS — M2550 Pain in unspecified joint: Secondary | ICD-10-CM | POA: Diagnosis not present

## 2014-04-04 DIAGNOSIS — R5381 Other malaise: Secondary | ICD-10-CM | POA: Diagnosis not present

## 2014-04-04 DIAGNOSIS — IMO0001 Reserved for inherently not codable concepts without codable children: Secondary | ICD-10-CM | POA: Diagnosis not present

## 2014-04-04 DIAGNOSIS — R5383 Other fatigue: Secondary | ICD-10-CM | POA: Diagnosis not present

## 2014-04-04 DIAGNOSIS — M1990 Unspecified osteoarthritis, unspecified site: Secondary | ICD-10-CM | POA: Diagnosis not present

## 2014-06-14 DIAGNOSIS — H2510 Age-related nuclear cataract, unspecified eye: Secondary | ICD-10-CM | POA: Diagnosis not present

## 2014-06-14 DIAGNOSIS — H40019 Open angle with borderline findings, low risk, unspecified eye: Secondary | ICD-10-CM | POA: Diagnosis not present

## 2014-08-02 DIAGNOSIS — E7800 Pure hypercholesterolemia, unspecified: Secondary | ICD-10-CM | POA: Diagnosis not present

## 2014-08-02 DIAGNOSIS — M899 Disorder of bone, unspecified: Secondary | ICD-10-CM | POA: Diagnosis not present

## 2014-08-02 DIAGNOSIS — M949 Disorder of cartilage, unspecified: Secondary | ICD-10-CM | POA: Diagnosis not present

## 2014-08-02 DIAGNOSIS — R7309 Other abnormal glucose: Secondary | ICD-10-CM | POA: Diagnosis not present

## 2014-08-02 DIAGNOSIS — I10 Essential (primary) hypertension: Secondary | ICD-10-CM | POA: Diagnosis not present

## 2014-08-07 DIAGNOSIS — I10 Essential (primary) hypertension: Secondary | ICD-10-CM | POA: Diagnosis not present

## 2014-08-07 DIAGNOSIS — E559 Vitamin D deficiency, unspecified: Secondary | ICD-10-CM | POA: Diagnosis not present

## 2014-08-07 DIAGNOSIS — E7800 Pure hypercholesterolemia, unspecified: Secondary | ICD-10-CM | POA: Diagnosis not present

## 2014-08-07 DIAGNOSIS — R7309 Other abnormal glucose: Secondary | ICD-10-CM | POA: Diagnosis not present

## 2014-08-24 DIAGNOSIS — Z23 Encounter for immunization: Secondary | ICD-10-CM | POA: Diagnosis not present

## 2014-08-31 DIAGNOSIS — K219 Gastro-esophageal reflux disease without esophagitis: Secondary | ICD-10-CM | POA: Diagnosis not present

## 2014-08-31 DIAGNOSIS — R197 Diarrhea, unspecified: Secondary | ICD-10-CM | POA: Diagnosis not present

## 2014-08-31 DIAGNOSIS — Z86010 Personal history of colonic polyps: Secondary | ICD-10-CM | POA: Diagnosis not present

## 2014-08-31 DIAGNOSIS — R1310 Dysphagia, unspecified: Secondary | ICD-10-CM | POA: Diagnosis not present

## 2014-09-06 DIAGNOSIS — H2513 Age-related nuclear cataract, bilateral: Secondary | ICD-10-CM | POA: Diagnosis not present

## 2014-09-06 DIAGNOSIS — H40013 Open angle with borderline findings, low risk, bilateral: Secondary | ICD-10-CM | POA: Diagnosis not present

## 2014-10-01 ENCOUNTER — Encounter: Payer: Self-pay | Admitting: Gynecology

## 2014-10-02 ENCOUNTER — Other Ambulatory Visit: Payer: Self-pay | Admitting: Gastroenterology

## 2014-10-02 DIAGNOSIS — R197 Diarrhea, unspecified: Secondary | ICD-10-CM | POA: Diagnosis not present

## 2014-10-02 DIAGNOSIS — R1310 Dysphagia, unspecified: Secondary | ICD-10-CM | POA: Diagnosis not present

## 2014-10-02 DIAGNOSIS — K5730 Diverticulosis of large intestine without perforation or abscess without bleeding: Secondary | ICD-10-CM | POA: Diagnosis not present

## 2014-10-02 DIAGNOSIS — K648 Other hemorrhoids: Secondary | ICD-10-CM | POA: Diagnosis not present

## 2014-10-02 DIAGNOSIS — D125 Benign neoplasm of sigmoid colon: Secondary | ICD-10-CM | POA: Diagnosis not present

## 2014-10-02 DIAGNOSIS — K449 Diaphragmatic hernia without obstruction or gangrene: Secondary | ICD-10-CM | POA: Diagnosis not present

## 2014-10-02 DIAGNOSIS — Z86010 Personal history of colonic polyps: Secondary | ICD-10-CM | POA: Diagnosis not present

## 2014-10-02 DIAGNOSIS — K921 Melena: Secondary | ICD-10-CM | POA: Diagnosis not present

## 2014-10-09 ENCOUNTER — Ambulatory Visit (INDEPENDENT_AMBULATORY_CARE_PROVIDER_SITE_OTHER): Payer: Medicare Other | Admitting: Gynecology

## 2014-10-09 ENCOUNTER — Encounter: Payer: Self-pay | Admitting: Gynecology

## 2014-10-09 DIAGNOSIS — R102 Pelvic and perineal pain: Secondary | ICD-10-CM

## 2014-10-09 DIAGNOSIS — N9489 Other specified conditions associated with female genital organs and menstrual cycle: Secondary | ICD-10-CM | POA: Diagnosis not present

## 2014-10-09 NOTE — Patient Instructions (Signed)
Try the Kegel exercises as we discussed.  Follow up if your symptoms persist   Kegel Exercises The goal of Kegel exercises is to isolate and exercise your pelvic floor muscles. These muscles act as a hammock that supports the rectum, vagina, small intestine, and uterus. As the muscles weaken, the hammock sags and these organs are displaced from their normal positions. Kegel exercises can strengthen your pelvic floor muscles and help you to improve bladder and bowel control, improve sexual response, and help reduce many problems and some discomfort during pregnancy. Kegel exercises can be done anywhere and at any time. HOW TO PERFORM KEGEL EXERCISES 1. Locate your pelvic floor muscles. To do this, squeeze (contract) the muscles that you use when you try to stop the flow of urine. You will feel a tightness in the vaginal area (women) and a tight lift in the rectal area (men and women). 2. When you begin, contract your pelvic muscles tight for 2-5 seconds, then relax them for 2-5 seconds. This is one set. Do 4-5 sets with a short pause in between. 3. Contract your pelvic muscles for 8-10 seconds, then relax them for 8-10 seconds. Do 4-5 sets. If you cannot contract your pelvic muscles for 8-10 seconds, try 5-7 seconds and work your way up to 8-10 seconds. Your goal is 4-5 sets of 10 contractions each day. Keep your stomach, buttocks, and legs relaxed during the exercises. Perform sets of both short and long contractions. Vary your positions. Perform these contractions 3-4 times per day. Perform sets while you are:   Lying in bed in the morning.  Standing at lunch.  Sitting in the late afternoon.  Lying in bed at night. You should do 40-50 contractions per day. Do not perform more Kegel exercises per day than recommended. Overexercising can cause muscle fatigue. Continue these exercises for for at least 15-20 weeks or as directed by your caregiver. Document Released: 11/02/2012 Document Reviewed:  11/02/2012 [facility] Patient Information 2015 [place]. This information is not intended to replace advice given to you by your health care provider. Make sure you discuss any questions you have with your health care provider.  or worsen

## 2014-10-09 NOTE — Progress Notes (Signed)
Kara Campos 05/18/1936 [id number]        78 y.o.  G2P2 Presents complaining of pelvic/vaginal pressure. Underwent colonoscopy where she did the prep and had a lot of nausea and vomiting with this. Since then she's noticed some vaginal pressure and was worried about prolapse. Not having any issues as far as urinary incontinence or problems with bowel movements.  Past medical history,surgical history, problem list, medications, allergies, family history and social history were all reviewed and documented in the EPIC chart.  Directed ROS with pertinent positives and negatives documented in the history of present illness/assessment and plan.  Exam: Kim assistant General appearance:  Normal Abdomen soft nontender without masses guarding rebound External BUS vagina with generalized atrophic changes. No overt cystocele rectocele or cuff prolapse. Bimanual without masses or tenderness. Rectovaginal exam normal.  Assessment/Plan:  78 y.o. G2P2 with pressure symptoms. Check urinalysis to rule out UTI.  No overt evidence of pelvic prolapse. I suspect she probably strained some of her support which makes her feel the pressure but no overt findings on exam. Will follow at present. Assuming her symptoms resolved then we will monitor. If they worsen or persist she'll represent for reevaluation.     Anastasio Auerbach MD, 11:32 AM 10/09/2014

## 2014-10-10 LAB — URINALYSIS W MICROSCOPIC + REFLEX CULTURE
Bilirubin Urine: NEGATIVE
CASTS: NONE SEEN
CRYSTALS: NONE SEEN
Glucose, UA: NEGATIVE mg/dL
Hgb urine dipstick: NEGATIVE
KETONES UR: NEGATIVE mg/dL
Leukocytes, UA: NEGATIVE
Nitrite: NEGATIVE
PH: 5 (ref 5.0–8.0)
Protein, ur: NEGATIVE mg/dL
SPECIFIC GRAVITY, URINE: 1.018 (ref 1.005–1.030)
UROBILINOGEN UA: 0.2 mg/dL (ref 0.0–1.0)

## 2014-10-11 ENCOUNTER — Other Ambulatory Visit: Payer: Self-pay | Admitting: Gynecology

## 2014-10-11 ENCOUNTER — Telehealth: Payer: Self-pay

## 2014-10-11 MED ORDER — CIPROFLOXACIN HCL 250 MG PO TABS: 250.0000 mg | ORAL_TABLET | Freq: Two times a day (BID) | ORAL | Status: DC

## 2014-10-11 NOTE — Telephone Encounter (Signed)
Rx sent 

## 2014-10-11 NOTE — Telephone Encounter (Signed)
-----   Message from Anastasio Auerbach, MD sent at 10/11/2014  4:00 PM EST ----- Tell patient it does look like she has a urinary tract infection. Recommend Septra DS 1 by mouth twice a day 3 days.

## 2014-10-11 NOTE — Telephone Encounter (Signed)
I added this to her allergies. Okay for ciprofloxacin 250 mg twice a day 3 days.

## 2014-10-11 NOTE — Telephone Encounter (Signed)
You prescribed Septra for her. She said she has terrible stomach upset with sulfa drugs and wondered if there is something else you could prescribe?

## 2014-10-12 LAB — URINE CULTURE: Colony Count: >=100000

## 2014-10-23 ENCOUNTER — Ambulatory Visit: Payer: Medicare Other | Admitting: Gynecology

## 2014-11-13 DIAGNOSIS — R197 Diarrhea, unspecified: Secondary | ICD-10-CM | POA: Diagnosis not present

## 2014-12-31 DIAGNOSIS — K648 Other hemorrhoids: Secondary | ICD-10-CM | POA: Diagnosis not present

## 2015-01-31 DIAGNOSIS — R7309 Other abnormal glucose: Secondary | ICD-10-CM | POA: Diagnosis not present

## 2015-01-31 DIAGNOSIS — I10 Essential (primary) hypertension: Secondary | ICD-10-CM | POA: Diagnosis not present

## 2015-01-31 DIAGNOSIS — E559 Vitamin D deficiency, unspecified: Secondary | ICD-10-CM | POA: Diagnosis not present

## 2015-02-05 DIAGNOSIS — Z5181 Encounter for therapeutic drug level monitoring: Secondary | ICD-10-CM | POA: Diagnosis not present

## 2015-02-05 DIAGNOSIS — R739 Hyperglycemia, unspecified: Secondary | ICD-10-CM | POA: Diagnosis not present

## 2015-02-05 DIAGNOSIS — E780 Pure hypercholesterolemia: Secondary | ICD-10-CM | POA: Diagnosis not present

## 2015-02-05 DIAGNOSIS — I10 Essential (primary) hypertension: Secondary | ICD-10-CM | POA: Diagnosis not present

## 2015-02-05 DIAGNOSIS — E559 Vitamin D deficiency, unspecified: Secondary | ICD-10-CM | POA: Diagnosis not present

## 2015-02-13 DIAGNOSIS — R309 Painful micturition, unspecified: Secondary | ICD-10-CM | POA: Diagnosis not present

## 2015-02-13 DIAGNOSIS — N390 Urinary tract infection, site not specified: Secondary | ICD-10-CM | POA: Diagnosis not present

## 2015-03-21 DIAGNOSIS — H524 Presbyopia: Secondary | ICD-10-CM | POA: Diagnosis not present

## 2015-03-21 DIAGNOSIS — H2513 Age-related nuclear cataract, bilateral: Secondary | ICD-10-CM | POA: Diagnosis not present

## 2015-03-21 DIAGNOSIS — H4011X2 Primary open-angle glaucoma, moderate stage: Secondary | ICD-10-CM | POA: Diagnosis not present

## 2015-03-26 DIAGNOSIS — L821 Other seborrheic keratosis: Secondary | ICD-10-CM | POA: Diagnosis not present

## 2015-03-26 DIAGNOSIS — L2089 Other atopic dermatitis: Secondary | ICD-10-CM | POA: Diagnosis not present

## 2015-05-02 DIAGNOSIS — I10 Essential (primary) hypertension: Secondary | ICD-10-CM | POA: Diagnosis not present

## 2015-05-02 DIAGNOSIS — E559 Vitamin D deficiency, unspecified: Secondary | ICD-10-CM | POA: Diagnosis not present

## 2015-05-08 DIAGNOSIS — E780 Pure hypercholesterolemia: Secondary | ICD-10-CM | POA: Diagnosis not present

## 2015-05-08 DIAGNOSIS — E559 Vitamin D deficiency, unspecified: Secondary | ICD-10-CM | POA: Diagnosis not present

## 2015-05-08 DIAGNOSIS — R202 Paresthesia of skin: Secondary | ICD-10-CM | POA: Diagnosis not present

## 2015-05-08 DIAGNOSIS — I10 Essential (primary) hypertension: Secondary | ICD-10-CM | POA: Diagnosis not present

## 2015-06-05 DIAGNOSIS — E559 Vitamin D deficiency, unspecified: Secondary | ICD-10-CM | POA: Diagnosis not present

## 2015-06-05 DIAGNOSIS — R202 Paresthesia of skin: Secondary | ICD-10-CM | POA: Diagnosis not present

## 2015-06-05 DIAGNOSIS — R7309 Other abnormal glucose: Secondary | ICD-10-CM | POA: Diagnosis not present

## 2015-06-05 DIAGNOSIS — R200 Anesthesia of skin: Secondary | ICD-10-CM | POA: Diagnosis not present

## 2015-06-12 DIAGNOSIS — E559 Vitamin D deficiency, unspecified: Secondary | ICD-10-CM | POA: Diagnosis not present

## 2015-06-12 DIAGNOSIS — I10 Essential (primary) hypertension: Secondary | ICD-10-CM | POA: Diagnosis not present

## 2015-06-12 DIAGNOSIS — E780 Pure hypercholesterolemia: Secondary | ICD-10-CM | POA: Diagnosis not present

## 2015-06-12 DIAGNOSIS — R739 Hyperglycemia, unspecified: Secondary | ICD-10-CM | POA: Diagnosis not present

## 2015-08-14 DIAGNOSIS — Z23 Encounter for immunization: Secondary | ICD-10-CM | POA: Diagnosis not present

## 2015-09-26 DIAGNOSIS — H401132 Primary open-angle glaucoma, bilateral, moderate stage: Secondary | ICD-10-CM | POA: Diagnosis not present

## 2015-09-26 DIAGNOSIS — H25013 Cortical age-related cataract, bilateral: Secondary | ICD-10-CM | POA: Diagnosis not present

## 2015-12-05 DIAGNOSIS — H401132 Primary open-angle glaucoma, bilateral, moderate stage: Secondary | ICD-10-CM | POA: Diagnosis not present

## 2015-12-11 DIAGNOSIS — R202 Paresthesia of skin: Secondary | ICD-10-CM | POA: Diagnosis not present

## 2015-12-11 DIAGNOSIS — M859 Disorder of bone density and structure, unspecified: Secondary | ICD-10-CM | POA: Diagnosis not present

## 2015-12-11 DIAGNOSIS — E559 Vitamin D deficiency, unspecified: Secondary | ICD-10-CM | POA: Diagnosis not present

## 2015-12-11 DIAGNOSIS — R739 Hyperglycemia, unspecified: Secondary | ICD-10-CM | POA: Diagnosis not present

## 2015-12-11 DIAGNOSIS — I10 Essential (primary) hypertension: Secondary | ICD-10-CM | POA: Diagnosis not present

## 2015-12-16 DIAGNOSIS — R739 Hyperglycemia, unspecified: Secondary | ICD-10-CM | POA: Diagnosis not present

## 2015-12-16 DIAGNOSIS — E559 Vitamin D deficiency, unspecified: Secondary | ICD-10-CM | POA: Diagnosis not present

## 2015-12-16 DIAGNOSIS — I10 Essential (primary) hypertension: Secondary | ICD-10-CM | POA: Diagnosis not present

## 2015-12-16 DIAGNOSIS — M859 Disorder of bone density and structure, unspecified: Secondary | ICD-10-CM | POA: Diagnosis not present

## 2016-03-10 DIAGNOSIS — E7800 Pure hypercholesterolemia, unspecified: Secondary | ICD-10-CM | POA: Diagnosis not present

## 2016-03-10 DIAGNOSIS — J301 Allergic rhinitis due to pollen: Secondary | ICD-10-CM | POA: Diagnosis not present

## 2016-03-10 DIAGNOSIS — E538 Deficiency of other specified B group vitamins: Secondary | ICD-10-CM | POA: Diagnosis not present

## 2016-03-10 DIAGNOSIS — H60501 Unspecified acute noninfective otitis externa, right ear: Secondary | ICD-10-CM | POA: Diagnosis not present

## 2016-03-17 DIAGNOSIS — I10 Essential (primary) hypertension: Secondary | ICD-10-CM | POA: Diagnosis not present

## 2016-03-17 DIAGNOSIS — E7800 Pure hypercholesterolemia, unspecified: Secondary | ICD-10-CM | POA: Diagnosis not present

## 2016-03-17 DIAGNOSIS — R7303 Prediabetes: Secondary | ICD-10-CM | POA: Diagnosis not present

## 2016-03-17 DIAGNOSIS — Z720 Tobacco use: Secondary | ICD-10-CM | POA: Diagnosis not present

## 2016-04-14 DIAGNOSIS — I10 Essential (primary) hypertension: Secondary | ICD-10-CM | POA: Diagnosis not present

## 2016-04-14 DIAGNOSIS — E7800 Pure hypercholesterolemia, unspecified: Secondary | ICD-10-CM | POA: Diagnosis not present

## 2016-04-14 DIAGNOSIS — R350 Frequency of micturition: Secondary | ICD-10-CM | POA: Diagnosis not present

## 2016-04-16 DIAGNOSIS — H401132 Primary open-angle glaucoma, bilateral, moderate stage: Secondary | ICD-10-CM | POA: Diagnosis not present

## 2016-04-16 DIAGNOSIS — H524 Presbyopia: Secondary | ICD-10-CM | POA: Diagnosis not present

## 2016-04-16 DIAGNOSIS — H25813 Combined forms of age-related cataract, bilateral: Secondary | ICD-10-CM | POA: Diagnosis not present

## 2016-05-12 DIAGNOSIS — H6090 Unspecified otitis externa, unspecified ear: Secondary | ICD-10-CM | POA: Diagnosis not present

## 2016-05-19 DIAGNOSIS — I10 Essential (primary) hypertension: Secondary | ICD-10-CM | POA: Diagnosis not present

## 2016-05-19 DIAGNOSIS — H6090 Unspecified otitis externa, unspecified ear: Secondary | ICD-10-CM | POA: Diagnosis not present

## 2016-06-04 DIAGNOSIS — F418 Other specified anxiety disorders: Secondary | ICD-10-CM | POA: Diagnosis not present

## 2016-08-13 DIAGNOSIS — Z23 Encounter for immunization: Secondary | ICD-10-CM | POA: Diagnosis not present

## 2016-10-08 DIAGNOSIS — I10 Essential (primary) hypertension: Secondary | ICD-10-CM | POA: Diagnosis not present

## 2016-10-26 DIAGNOSIS — E7800 Pure hypercholesterolemia, unspecified: Secondary | ICD-10-CM | POA: Diagnosis not present

## 2016-10-26 DIAGNOSIS — I10 Essential (primary) hypertension: Secondary | ICD-10-CM | POA: Diagnosis not present

## 2016-10-26 DIAGNOSIS — R739 Hyperglycemia, unspecified: Secondary | ICD-10-CM | POA: Diagnosis not present

## 2017-02-12 ENCOUNTER — Inpatient Hospital Stay (HOSPITAL_COMMUNITY)
Admission: EM | Admit: 2017-02-12 | Discharge: 2017-02-15 | DRG: 470 | Disposition: A | Discharge status: Home Health | Payer: Medicare Other | Attending: Internal Medicine | Admitting: Internal Medicine

## 2017-02-12 ENCOUNTER — Emergency Department (HOSPITAL_COMMUNITY): Payer: Medicare Other

## 2017-02-12 ENCOUNTER — Encounter (HOSPITAL_COMMUNITY): Payer: Self-pay | Admitting: Nurse Practitioner

## 2017-02-12 DIAGNOSIS — S72042A Displaced fracture of base of neck of left femur, initial encounter for closed fracture: Secondary | ICD-10-CM | POA: Diagnosis not present

## 2017-02-12 DIAGNOSIS — I10 Essential (primary) hypertension: Secondary | ICD-10-CM | POA: Diagnosis not present

## 2017-02-12 DIAGNOSIS — Y92009 Unspecified place in unspecified non-institutional (private) residence as the place of occurrence of the external cause: Secondary | ICD-10-CM

## 2017-02-12 DIAGNOSIS — D509 Iron deficiency anemia, unspecified: Secondary | ICD-10-CM | POA: Diagnosis present

## 2017-02-12 DIAGNOSIS — T148XXA Other injury of unspecified body region, initial encounter: Secondary | ICD-10-CM | POA: Diagnosis not present

## 2017-02-12 DIAGNOSIS — R197 Diarrhea, unspecified: Secondary | ICD-10-CM | POA: Diagnosis not present

## 2017-02-12 DIAGNOSIS — M810 Age-related osteoporosis without current pathological fracture: Secondary | ICD-10-CM | POA: Diagnosis present

## 2017-02-12 DIAGNOSIS — Z885 Allergy status to narcotic agent status: Secondary | ICD-10-CM | POA: Diagnosis not present

## 2017-02-12 DIAGNOSIS — Z09 Encounter for follow-up examination after completed treatment for conditions other than malignant neoplasm: Secondary | ICD-10-CM

## 2017-02-12 DIAGNOSIS — E785 Hyperlipidemia, unspecified: Secondary | ICD-10-CM | POA: Diagnosis present

## 2017-02-12 DIAGNOSIS — F17210 Nicotine dependence, cigarettes, uncomplicated: Secondary | ICD-10-CM | POA: Diagnosis present

## 2017-02-12 DIAGNOSIS — Z471 Aftercare following joint replacement surgery: Secondary | ICD-10-CM | POA: Diagnosis not present

## 2017-02-12 DIAGNOSIS — Z882 Allergy status to sulfonamides status: Secondary | ICD-10-CM | POA: Diagnosis not present

## 2017-02-12 DIAGNOSIS — Z90710 Acquired absence of both cervix and uterus: Secondary | ICD-10-CM

## 2017-02-12 DIAGNOSIS — Z96642 Presence of left artificial hip joint: Secondary | ICD-10-CM | POA: Diagnosis not present

## 2017-02-12 DIAGNOSIS — S72002A Fracture of unspecified part of neck of left femur, initial encounter for closed fracture: Secondary | ICD-10-CM | POA: Diagnosis not present

## 2017-02-12 DIAGNOSIS — M25552 Pain in left hip: Secondary | ICD-10-CM | POA: Diagnosis not present

## 2017-02-12 DIAGNOSIS — S79912A Unspecified injury of left hip, initial encounter: Secondary | ICD-10-CM | POA: Diagnosis not present

## 2017-02-12 DIAGNOSIS — R079 Chest pain, unspecified: Secondary | ICD-10-CM | POA: Diagnosis not present

## 2017-02-12 DIAGNOSIS — W010XXA Fall on same level from slipping, tripping and stumbling without subsequent striking against object, initial encounter: Secondary | ICD-10-CM | POA: Diagnosis present

## 2017-02-12 DIAGNOSIS — S72009A Fracture of unspecified part of neck of unspecified femur, initial encounter for closed fracture: Secondary | ICD-10-CM | POA: Diagnosis present

## 2017-02-12 DIAGNOSIS — E119 Type 2 diabetes mellitus without complications: Secondary | ICD-10-CM | POA: Diagnosis present

## 2017-02-12 DIAGNOSIS — M797 Fibromyalgia: Secondary | ICD-10-CM | POA: Diagnosis not present

## 2017-02-12 DIAGNOSIS — Z8041 Family history of malignant neoplasm of ovary: Secondary | ICD-10-CM | POA: Diagnosis not present

## 2017-02-12 DIAGNOSIS — K644 Residual hemorrhoidal skin tags: Secondary | ICD-10-CM | POA: Diagnosis not present

## 2017-02-12 DIAGNOSIS — K648 Other hemorrhoids: Secondary | ICD-10-CM | POA: Diagnosis not present

## 2017-02-12 DIAGNOSIS — S299XXA Unspecified injury of thorax, initial encounter: Secondary | ICD-10-CM | POA: Diagnosis not present

## 2017-02-12 LAB — CBC WITH DIFFERENTIAL/PLATELET
BASOS ABS: 0 10*3/uL (ref 0.0–0.1)
BASOS PCT: 0 %
EOS ABS: 0 10*3/uL (ref 0.0–0.7)
Eosinophils Relative: 0 %
HCT: 42.1 % (ref 36.0–46.0)
HEMOGLOBIN: 14.2 g/dL (ref 12.0–15.0)
Lymphocytes Relative: 19 %
Lymphs Abs: 2 10*3/uL (ref 0.7–4.0)
MCH: 32.9 pg (ref 26.0–34.0)
MCHC: 33.7 g/dL (ref 30.0–36.0)
MCV: 97.7 fL (ref 78.0–100.0)
Monocytes Absolute: 0.7 10*3/uL (ref 0.1–1.0)
Monocytes Relative: 6 %
NEUTROS PCT: 75 %
Neutro Abs: 8.1 10*3/uL — ABNORMAL HIGH (ref 1.7–7.7)
PLATELETS: 240 10*3/uL (ref 150–400)
RBC: 4.31 MIL/uL (ref 3.87–5.11)
RDW: 13 % (ref 11.5–15.5)
WBC: 10.9 10*3/uL — AB (ref 4.0–10.5)

## 2017-02-12 LAB — TYPE AND SCREEN
ABO/RH(D): A POS
Antibody Screen: NEGATIVE

## 2017-02-12 LAB — BASIC METABOLIC PANEL
Anion gap: 13 (ref 5–15)
BUN: 16 mg/dL (ref 6–20)
CALCIUM: 9.4 mg/dL (ref 8.9–10.3)
CO2: 22 mmol/L (ref 22–32)
CREATININE: 0.58 mg/dL (ref 0.44–1.00)
Chloride: 103 mmol/L (ref 101–111)
Glucose, Bld: 116 mg/dL — ABNORMAL HIGH (ref 65–99)
Potassium: 3.2 mmol/L — ABNORMAL LOW (ref 3.5–5.1)
SODIUM: 138 mmol/L (ref 135–145)

## 2017-02-12 LAB — I-STAT CHEM 8, ED
BUN: 15 mg/dL (ref 6–20)
CALCIUM ION: 1.11 mmol/L — AB (ref 1.15–1.40)
CREATININE: 0.5 mg/dL (ref 0.44–1.00)
Chloride: 102 mmol/L (ref 101–111)
GLUCOSE: 120 mg/dL — AB (ref 65–99)
HCT: 44 % (ref 36.0–46.0)
HEMOGLOBIN: 15 g/dL (ref 12.0–15.0)
Potassium: 3.3 mmol/L — ABNORMAL LOW (ref 3.5–5.1)
Sodium: 140 mmol/L (ref 135–145)
TCO2: 24 mmol/L (ref 0–100)

## 2017-02-12 LAB — PROTIME-INR
INR: 1
PROTHROMBIN TIME: 13.2 s (ref 11.4–15.2)

## 2017-02-12 LAB — ABO/RH: ABO/RH(D): A POS

## 2017-02-12 MED ORDER — ACETAMINOPHEN 650 MG RE SUPP: 650.0000 mg | Freq: Four times a day (QID) | RECTAL | Status: DC | PRN

## 2017-02-12 MED ORDER — DOCUSATE SODIUM 100 MG PO CAPS
100.0000 mg | ORAL_CAPSULE | Freq: Two times a day (BID) | ORAL | Status: DC
  Administered 2017-02-12 – 2017-02-15 (×5): 100 mg via ORAL
  Filled 2017-02-12 (×6): qty 1

## 2017-02-12 MED ORDER — HYDRALAZINE HCL 20 MG/ML IJ SOLN: 10.0000 mg | INTRAMUSCULAR | Status: DC | PRN

## 2017-02-12 MED ORDER — TIMOLOL HEMIHYDRATE 0.5 % OP SOLN: 1.0000 [drp] | Freq: Every day | OPHTHALMIC | Status: DC

## 2017-02-12 MED ORDER — TIMOLOL MALEATE 0.5 % OP SOLN
1.0000 [drp] | Freq: Every day | OPHTHALMIC | Status: DC
  Administered 2017-02-13 – 2017-02-15 (×3): 1 [drp] via OPHTHALMIC
  Filled 2017-02-12: qty 5

## 2017-02-12 MED ORDER — ENOXAPARIN SODIUM 40 MG/0.4ML ~~LOC~~ SOLN
40.0000 mg | SUBCUTANEOUS | Status: DC
  Administered 2017-02-12: 23:00:00 40 mg via SUBCUTANEOUS
  Filled 2017-02-12: qty 0.4

## 2017-02-12 MED ORDER — MORPHINE SULFATE (PF) 4 MG/ML IV SOLN
1.0000 mg | INTRAVENOUS | Status: DC | PRN
  Administered 2017-02-12 – 2017-02-13 (×3): 1 mg via INTRAVENOUS
  Filled 2017-02-12 (×4): qty 1

## 2017-02-12 MED ORDER — ACETAMINOPHEN 325 MG PO TABS: 650.0000 mg | ORAL_TABLET | Freq: Four times a day (QID) | ORAL | Status: DC | PRN

## 2017-02-12 MED ORDER — VITAMIN D3 25 MCG (1000 UNIT) PO TABS
1000.0000 [IU] | ORAL_TABLET | Freq: Every day | ORAL | Status: DC
  Administered 2017-02-12 – 2017-02-15 (×3): 1000 [IU] via ORAL
  Filled 2017-02-12 (×3): qty 1

## 2017-02-12 MED ORDER — ONDANSETRON HCL 4 MG/2ML IJ SOLN: 4.0000 mg | Freq: Four times a day (QID) | INTRAMUSCULAR | Status: DC | PRN

## 2017-02-12 MED ORDER — HYDROMORPHONE HCL 1 MG/ML IJ SOLN
0.5000 mg | Freq: Once | INTRAMUSCULAR | Status: AC
  Administered 2017-02-12: 0.5 mg via INTRAVENOUS
  Filled 2017-02-12: qty 0.5

## 2017-02-12 MED ORDER — AMLODIPINE BESYLATE 5 MG PO TABS
5.0000 mg | ORAL_TABLET | Freq: Every day | ORAL | Status: DC
  Administered 2017-02-12 – 2017-02-13 (×2): 5 mg via ORAL
  Filled 2017-02-12 (×3): qty 1

## 2017-02-12 MED ORDER — DEXTROSE-NACL 5-0.9 % IV SOLN
INTRAVENOUS | Status: DC
  Administered 2017-02-12: 22:00:00 75 mL/h via INTRAVENOUS

## 2017-02-12 MED ORDER — OMEGA-3-ACID ETHYL ESTERS 1 G PO CAPS
2.0000 g | ORAL_CAPSULE | Freq: Every day | ORAL | Status: DC
  Administered 2017-02-12 – 2017-02-14 (×2): 2 g via ORAL
  Filled 2017-02-12 (×2): qty 2

## 2017-02-12 MED ORDER — COENZYME Q10 30 MG PO CAPS: 50.0000 mg | ORAL_CAPSULE | ORAL | Status: DC

## 2017-02-12 MED ORDER — HYDROMORPHONE HCL 1 MG/ML IJ SOLN
0.5000 mg | Freq: Once | INTRAMUSCULAR | Status: AC
Start: 2017-02-12 — End: 2017-02-12
  Administered 2017-02-12: 0.5 mg via INTRAVENOUS
  Filled 2017-02-12: qty 0.5

## 2017-02-12 MED ORDER — ONDANSETRON HCL 4 MG PO TABS: 4.0000 mg | ORAL_TABLET | Freq: Four times a day (QID) | ORAL | Status: DC | PRN

## 2017-02-12 MED ORDER — OMEGA-3 FATTY ACIDS 1000 MG PO CAPS: 2.0000 g | ORAL_CAPSULE | Freq: Every day | ORAL | Status: DC

## 2017-02-12 MED ORDER — TRAMADOL HCL 50 MG PO TABS: 50.0000 mg | ORAL_TABLET | Freq: Four times a day (QID) | ORAL | Status: DC | PRN

## 2017-02-12 MED ORDER — NORTRIPTYLINE HCL 10 MG PO CAPS
10.0000 mg | ORAL_CAPSULE | Freq: Every day | ORAL | Status: DC
  Administered 2017-02-12 – 2017-02-14 (×3): 10 mg via ORAL
  Filled 2017-02-12 (×3): qty 1

## 2017-02-12 MED ORDER — ONDANSETRON HCL 4 MG/2ML IJ SOLN
4.0000 mg | Freq: Once | INTRAMUSCULAR | Status: AC
  Administered 2017-02-12: 4 mg via INTRAVENOUS
  Filled 2017-02-12: qty 2

## 2017-02-12 MED ORDER — CALCIUM CARBONATE-VITAMIN D 600-400 MG-UNIT PO TABS: 1.0000 | ORAL_TABLET | Freq: Every day | ORAL | Status: DC

## 2017-02-12 MED ORDER — CALCIUM CARBONATE-VITAMIN D 500-200 MG-UNIT PO TABS
1.0000 | ORAL_TABLET | Freq: Every day | ORAL | Status: DC
  Administered 2017-02-14 – 2017-02-15 (×2): 1 via ORAL
  Filled 2017-02-12 (×3): qty 1

## 2017-02-12 NOTE — ED Provider Notes (Signed)
[facility] DEPT Provider Note   CSN: [id number] Arrival date & time: 02/12/17  1518     History   Chief Complaint Chief Complaint  Patient presents with  . Fall  . Hip Pain    left hip    HPI Kara Campos is a 81 y.o. female.  81 yo F with a with a cc of L hip pain.  Post fall at home.  Thinks she lost her balance and fell onto left hip.  Denies syncopal event.  Denies chest pain, headache, neck pain, back pain, abdominal pain.  Given fentanyl with minimal improvement.    The history is provided by the patient.  Fall  This is a new problem. The current episode started less than 1 hour ago. The problem occurs constantly. The problem has not changed since onset.Pertinent negatives include no chest pain, no headaches and no shortness of breath. Nothing aggravates the symptoms. Nothing relieves the symptoms. She has tried nothing for the symptoms. The treatment provided no relief.  Hip Pain  Pertinent negatives include no chest pain, no headaches and no shortness of breath.    Past Medical History:  Diagnosis Date  . Arthritis   . Blood transfusion   . Diabetes mellitus    borderline  . Hemorrhoids   . Hyperlipidemia   . Osteoporosis     Patient Active Problem List   Diagnosis Date Noted  . Hip fracture ([place]) 02/12/2017  . Candidiasis of anus and buttock 06/24/2012  . Diarrhea 06/24/2012  . External hemorrhoids 05/04/2012  . Internal hemorrhoids with complication 81/85/6314    Past Surgical History:  Procedure Laterality Date  . VAGINAL HYSTERECTOMY  1985    OB History    Gravida Para Term Preterm AB Living   2 2       2    SAB TAB Ectopic Multiple Live Births                   Home Medications    Prior to Admission medications   Medication Sig Start Date End Date Taking? Authorizing Provider  amLODipine (NORVASC) 5 MG tablet Take 5 mg by mouth daily.   Yes Historical Provider, MD  Calcium Carbonate-Vitamin D (CALTRATE 600+D) 600-400 MG-UNIT per  tablet Take 1 tablet by mouth daily.   Yes Historical Provider, MD  Cholecalciferol (VITAMIN D PO) Take by mouth.   Yes Historical Provider, MD  co-enzyme Q-10 30 MG capsule Take 50 mg by mouth once a week.   Yes Historical Provider, MD  fish oil-omega-3 fatty acids 1000 MG capsule Take 2 g by mouth daily.   Yes Historical Provider, MD  losartan (COZAAR) 25 MG tablet Take 1 tablet by mouth daily.   Yes Historical Provider, MD  Magnesium Citrate 100 MG TABS Take 100 mg by mouth at bedtime.   Yes Historical Provider, MD  nortriptyline (PAMELOR) 10 MG capsule Take 10 mg by mouth at bedtime.   Yes Historical Provider, MD  timolol (BETIMOL) 0.5 % ophthalmic solution Place 1 drop into both eyes daily.   Yes Historical Provider, MD  traMADol (ULTRAM) 50 MG tablet Take 50 mg by mouth every 6 (six) hours as needed.    Historical Provider, MD    Family History Family History  Problem Relation Age of Onset  . Ovarian cancer Maternal Grandmother     Social History Social History  Substance Use Topics  . Smoking status: Current Every Day Smoker    Packs/day: 0.50    Types:  Cigarettes  . Smokeless tobacco: Never Used  . Alcohol use 0.0 oz/week     Comment: Occas     Allergies   Codeine and Sulfa antibiotics   Review of Systems Review of Systems  Constitutional: Negative for chills and fever.  HENT: Negative for congestion and rhinorrhea.   Eyes: Negative for redness and visual disturbance.  Respiratory: Negative for shortness of breath and wheezing.   Cardiovascular: Negative for chest pain and palpitations.  Gastrointestinal: Negative for nausea and vomiting.  Genitourinary: Negative for dysuria and urgency.  Musculoskeletal: Negative for arthralgias and myalgias.  Skin: Negative for pallor and wound.  Neurological: Negative for dizziness and headaches.     Physical Exam Updated Vital Signs BP (!) 174/74 (BP Location: Left Arm)   Pulse 88   Temp 98.4 F (36.9 C) (Oral)   Resp  18   SpO2 93%   Physical Exam  Constitutional: She is oriented to person, place, and time. She appears well-developed and well-nourished. No distress.  HENT:  Head: Normocephalic and atraumatic.  Eyes: EOM are normal. Pupils are equal, round, and reactive to light.  Neck: Normal range of motion. Neck supple.  Cardiovascular: Normal rate and regular rhythm.  Exam reveals no gallop and no friction rub.   No murmur heard. Pulmonary/Chest: Effort normal. She has no wheezes. She has no rales.  Abdominal: Soft. She exhibits no distension. There is no tenderness.  Musculoskeletal: She exhibits no edema or tenderness.  Externally rotated, PMS intact distally. Pain with internal external rotation.   Neurological: She is alert and oriented to person, place, and time.  Skin: Skin is warm and dry. She is not diaphoretic.  Psychiatric: She has a normal mood and affect. Her behavior is normal.  Nursing note and vitals reviewed.    ED Treatments / Results  Labs (all labs ordered are listed, but only abnormal results are displayed) Labs Reviewed  BASIC METABOLIC PANEL - Abnormal; Notable for the following:       Result Value   Potassium 3.2 (*)    Glucose, Bld 116 (*)    All other components within normal limits  CBC WITH DIFFERENTIAL/PLATELET - Abnormal; Notable for the following:    WBC 10.9 (*)    Neutro Abs 8.1 (*)    All other components within normal limits  I-STAT CHEM 8, ED - Abnormal; Notable for the following:    Potassium 3.3 (*)    Glucose, Bld 120 (*)    Calcium, Ion 1.11 (*)    All other components within normal limits  PROTIME-INR  COMPREHENSIVE METABOLIC PANEL  CBC  TYPE AND SCREEN  ABO/RH    EKG  EKG Interpretation None       Radiology Dg Chest 2 View  Result Date: 02/12/2017 CLINICAL DATA:  Pain following fall.  Hypertension. EXAM: CHEST  2 VIEW COMPARISON:  May 08, 2015 FINDINGS: There is no edema or consolidation. Heart size and pulmonary vascularity are  normal. No adenopathy. Focal calcification is noted in the left breast. There is atherosclerotic calcification in the aorta. No pneumothorax. There is degenerative change in mid thoracic spine. No acute fracture evident. IMPRESSION: No edema or consolidation. Aortic atherosclerosis. No pneumothorax. Calcification noted in left breast. Electronically Signed   By: Lowella Grip III M.D.   On: 02/12/2017 16:51   Dg Hip Unilat W Or Wo Pelvis 2-3 Views Left  Result Date: 02/12/2017 CLINICAL DATA:  Fall today with left hip pain. EXAM: DG HIP (WITH OR WITHOUT PELVIS)  2-3V LEFT COMPARISON:  None. FINDINGS: Comminuted impacted left femoral neck fracture with mild apex lateral angulation and 15 mm superior displacement of the dominant distal fracture fragment. No dislocation of the left femoral head at the left hip joint. No additional fracture. No suspicious focal osseous lesion. No radiopaque foreign body. IMPRESSION: Comminuted impacted left femoral neck fracture as detailed. Electronically Signed   By: Ilona Sorrel M.D.   On: 02/12/2017 16:52    Procedures Procedures (including critical care time)  Medications Ordered in ED Medications  cholecalciferol (VITAMIN D) tablet 1,000 Units (not administered)  amLODipine (NORVASC) tablet 5 mg (not administered)  nortriptyline (PAMELOR) capsule 10 mg (not administered)  enoxaparin (LOVENOX) injection 40 mg (not administered)  dextrose 5 %-0.9 % sodium chloride infusion (75 mL/hr Intravenous New Bag/Given 02/12/17 2208)  acetaminophen (TYLENOL) tablet 650 mg (not administered)    Or  acetaminophen (TYLENOL) suppository 650 mg (not administered)  docusate sodium (COLACE) capsule 100 mg (not administered)  ondansetron (ZOFRAN) tablet 4 mg (not administered)    Or  ondansetron (ZOFRAN) injection 4 mg (not administered)  morphine 4 MG/ML injection 1 mg (1 mg Intravenous Given 02/12/17 2008)  hydrALAZINE (APRESOLINE) injection 10 mg (not administered)    calcium-vitamin D (OSCAL WITH D) 500-200 MG-UNIT per tablet 1 tablet (not administered)  omega-3 acid ethyl esters (LOVAZA) capsule 2 g (not administered)  timolol (TIMOPTIC) 0.5 % ophthalmic solution 1 drop (not administered)  HYDROmorphone (DILAUDID) injection 0.5 mg (0.5 mg Intravenous Given 02/12/17 1619)  ondansetron (ZOFRAN) injection 4 mg (4 mg Intravenous Given 02/12/17 1620)  HYDROmorphone (DILAUDID) injection 0.5 mg (0.5 mg Intravenous Given 02/12/17 1839)     Initial Impression / Assessment and Plan / ED Course  I have reviewed the triage vital signs and the nursing notes.  Pertinent labs & imaging results that were available during my care of the patient were reviewed by me and considered in my medical decision making (see chart for details).     81 yo F with left hip pain post fall, likely fx on exam, will obtain plain film.   X-ray with left femoral neck fracture. Discussed with Dr. Doran Durand, orthopedics. They will likely see in the morning expect Dr. Alvan Dame to do the surgery.  The patients results and plan were reviewed and discussed.   Any x-rays performed were independently reviewed by myself.   Differential diagnosis were considered with the presenting HPI.  Medications  cholecalciferol (VITAMIN D) tablet 1,000 Units (not administered)  amLODipine (NORVASC) tablet 5 mg (not administered)  nortriptyline (PAMELOR) capsule 10 mg (not administered)  enoxaparin (LOVENOX) injection 40 mg (not administered)  dextrose 5 %-0.9 % sodium chloride infusion (75 mL/hr Intravenous New Bag/Given 02/12/17 2208)  acetaminophen (TYLENOL) tablet 650 mg (not administered)    Or  acetaminophen (TYLENOL) suppository 650 mg (not administered)  docusate sodium (COLACE) capsule 100 mg (not administered)  ondansetron (ZOFRAN) tablet 4 mg (not administered)    Or  ondansetron (ZOFRAN) injection 4 mg (not administered)  morphine 4 MG/ML injection 1 mg (1 mg Intravenous Given 02/12/17 2008)   hydrALAZINE (APRESOLINE) injection 10 mg (not administered)  calcium-vitamin D (OSCAL WITH D) 500-200 MG-UNIT per tablet 1 tablet (not administered)  omega-3 acid ethyl esters (LOVAZA) capsule 2 g (not administered)  timolol (TIMOPTIC) 0.5 % ophthalmic solution 1 drop (not administered)  HYDROmorphone (DILAUDID) injection 0.5 mg (0.5 mg Intravenous Given 02/12/17 1619)  ondansetron (ZOFRAN) injection 4 mg (4 mg Intravenous Given 02/12/17 1620)  HYDROmorphone (DILAUDID) injection  0.5 mg (0.5 mg Intravenous Given 02/12/17 1839)    Vitals:   02/12/17 1800 02/12/17 1900 02/12/17 2000 02/12/17 2118  BP: (!) 175/78 (!) 182/87 (!) 188/85 (!) 174/74  Pulse: 97 90 95 88  Resp: (!) 21 (!) 21 18 18   Temp:    98.4 F (36.9 C)  TempSrc:    Oral  SpO2: 93% 95% 94% 93%    Final diagnoses:  Left displaced femoral neck fracture (HCC)    Admission/ observation were discussed with the admitting physician, patient and/or family and they are comfortable with the plan.    Final Clinical Impressions(s) / ED Diagnoses   Final diagnoses:  Left displaced femoral neck fracture [facility])    New Prescriptions Current Discharge Medication List       Deno Etienne, DO 02/12/17 2254

## 2017-02-12 NOTE — H&P (Signed)
History and Physical    Kara Campos UTM:[id number] DOB: October 26, 1936 DOA: 02/12/2017  PCP: [name], MD   Patient coming from:  Home   Chief Complaint: Left hip pain  HPI: Kara Campos is a 81 y.o. female with medical history significant of hypertension and fibromyalgia, presents with sever left hip pain. Patient was at her usual state of health until this morning when she tripped and fell, landing on her left side. No head trauma or loss of consciousness. After the fall patient developed excruciating pain on her left hip, 10 out of 10 intensity, sharp in nature, worse with movement, no improving factors, no significant radiation, the pain was severe enough that she could not stand back on her feet, was brought to the hospital for further evaluation.  Patient is physically active, she is able to climb steps with no major problems. Denies any chest pain or shortness of breath with exertion.  ED Course: Patient was diagnosed with left hip fracture, started on analgesia regimen and consulted orthopedics.   Review of Systems:  1. General. No fevers or chills 2. ENT no runny nose or sore throat 3. Gastrointestinal no nausea vomiting or diarrhea 4. Pulmonary no shortness of breath cough or hemoptysis 5. Cardiovascular, no angina no claudication, no PND orthopnea 6. Hematology no easy bruisability or frequent infections 7. Urology no dysuria or increased urinary frequency 8. Dermatology no rashes 9. Psych no depression or anxiety 10. Neurology seizures or paresthesias  Past Medical History:  Diagnosis Date  . Arthritis   . Blood transfusion   . Diabetes mellitus    borderline  . Hemorrhoids   . Hyperlipidemia   . Osteoporosis     Past Surgical History:  Procedure Laterality Date  . VAGINAL HYSTERECTOMY  1985     reports that she has been smoking Cigarettes.  She has been smoking about 0.50 packs per day. She has never used smokeless tobacco. She reports that she drinks  alcohol. She reports that she does not use drugs.  Allergies  Allergen Reactions  . Codeine Nausea Only  . Sulfa Antibiotics Nausea And Vomiting    Family History  Problem Relation Age of Onset  . Ovarian cancer Maternal Grandmother    Unacceptable: Noncontributory, unremarkable, or negative. Acceptable: Family history reviewed and not pertinent (If you reviewed it)  Prior to Admission medications   Medication Sig Start Date End Date Taking? Authorizing Provider  LOSARTAN POTASSIUM PO Take 1 tablet by mouth daily.   Yes Historical Provider, MD  timolol (BETIMOL) 0.5 % ophthalmic solution Place 1 drop into both eyes daily.   Yes Historical Provider, MD  amLODipine (NORVASC) 5 MG tablet Take 5 mg by mouth daily.    Historical Provider, MD  Calcium Carbonate-Vitamin D (CALTRATE 600+D) 600-400 MG-UNIT per tablet Take 1 tablet by mouth daily.    Historical Provider, MD  Cholecalciferol (VITAMIN D PO) Take by mouth.    Historical Provider, MD  ciprofloxacin (CIPRO) 250 MG tablet Take 1 tablet (250 mg total) by mouth 2 (two) times daily. 10/11/14   Anastasio Auerbach, MD  co-enzyme Q-10 30 MG capsule Take 50 mg by mouth once a week.    Historical Provider, MD  fish oil-omega-3 fatty acids 1000 MG capsule Take 2 g by mouth daily.    Historical Provider, MD  nortriptyline (PAMELOR) 10 MG capsule Take 10 mg by mouth at bedtime.    Historical Provider, MD  traMADol (ULTRAM) 50 MG tablet Take 50 mg by  mouth every 6 (six) hours as needed.    Historical Provider, MD    Physical Exam: Vitals:   02/12/17 1542 02/12/17 1544  BP:  (!) 186/76  Pulse:  84  Resp:  16  Temp:  97.6 F (36.4 C)  TempSrc:  Oral  SpO2: 100% 100%    Constitutional: deconditioned and in pain.  Vitals:   02/12/17 1542 02/12/17 1544  BP:  (!) 186/76  Pulse:  84  Resp:  16  Temp:  97.6 F (36.4 C)  TempSrc:  Oral  SpO2: 100% 100%   Eyes: PERRL, lids and conjunctivae with mild pallor.  Head normocephalic, nose  and ears no deformities.  ENMT: Mucous membranes are moist. Posterior pharynx clear of any exudate or lesions.Normal dentition.  Neck: normal, supple, no masses, no thyromegaly Respiratory: clear to auscultation bilaterally, no wheezing, no crackles. Normal respiratory effort. No accessory muscle use.  Cardiovascular: Regular rate and rhythm, no murmurs / rubs / gallops. No extremity edema. 2+ pedal pulses. No carotid bruits.  Abdomen: no tenderness, no masses palpated. No hepatosplenomegaly. Bowel sounds positive.  Musculoskeletal: no clubbing / cyanosis. No joint deformity upper and lower extremities. Good ROM, no contractures. Normal muscle tone. Shortening of the left lower extremity no significant rotation. Severe pain to passive movement.  Skin: no rashes, lesions, ulcers. No induration Neurologic: CN 2-12 grossly intact. Sensation intact, DTR normal. Strength 5/5 in all 4.      Labs on Admission: I have personally reviewed following labs and imaging studies  CBC:  Recent Labs Lab 02/12/17 1742  HGB 15.0  HCT 08.6   Basic Metabolic Panel:  Recent Labs Lab 02/12/17 1742  NA 140  K 3.3*  CL 102  GLUCOSE 120*  BUN 15  CREATININE 0.50   GFR: CrCl cannot be calculated (Unknown ideal weight.). Liver Function Tests: No results for input(s): AST, ALT, ALKPHOS, BILITOT, PROT, ALBUMIN in the last 168 hours. No results for input(s): LIPASE, AMYLASE in the last 168 hours. No results for input(s): AMMONIA in the last 168 hours. Coagulation Profile: No results for input(s): INR, PROTIME in the last 168 hours. Cardiac Enzymes: No results for input(s): CKTOTAL, CKMB, CKMBINDEX, TROPONINI in the last 168 hours. BNP (last 3 results) No results for input(s): PROBNP in the last 8760 hours. HbA1C: No results for input(s): HGBA1C in the last 72 hours. CBG: No results for input(s): GLUCAP in the last 168 hours. Lipid Profile: No results for input(s): CHOL, HDL, LDLCALC, TRIG,  CHOLHDL, LDLDIRECT in the last 72 hours. Thyroid Function Tests: No results for input(s): TSH, T4TOTAL, FREET4, T3FREE, THYROIDAB in the last 72 hours. Anemia Panel: No results for input(s): VITAMINB12, FOLATE, FERRITIN, TIBC, IRON, RETICCTPCT in the last 72 hours. Urine analysis:    Component Value Date/Time   COLORURINE YELLOW 10/09/2014 1204   APPEARANCEUR CLOUDY (A) 10/09/2014 1204   LABSPEC 1.018 10/09/2014 1204   PHURINE 5.0 10/09/2014 1204   GLUCOSEU NEG 10/09/2014 1204   HGBUR NEG 10/09/2014 1204   BILIRUBINUR NEG 10/09/2014 1204   KETONESUR NEG 10/09/2014 1204   PROTEINUR NEG 10/09/2014 1204   UROBILINOGEN 0.2 10/09/2014 1204   NITRITE NEG 10/09/2014 1204   LEUKOCYTESUR NEG 10/09/2014 1204    Radiological Exams on Admission: Dg Chest 2 View  Result Date: 02/12/2017 CLINICAL DATA:  Pain following fall.  Hypertension. EXAM: CHEST  2 VIEW COMPARISON:  May 08, 2015 FINDINGS: There is no edema or consolidation. Heart size and pulmonary vascularity are normal. No adenopathy. Focal  calcification is noted in the left breast. There is atherosclerotic calcification in the aorta. No pneumothorax. There is degenerative change in mid thoracic spine. No acute fracture evident. IMPRESSION: No edema or consolidation. Aortic atherosclerosis. No pneumothorax. Calcification noted in left breast. Electronically Signed   By: Lowella Grip III M.D.   On: 02/12/2017 16:51   Dg Hip Unilat W Or Wo Pelvis 2-3 Views Left  Result Date: 02/12/2017 CLINICAL DATA:  Fall today with left hip pain. EXAM: DG HIP (WITH OR WITHOUT PELVIS) 2-3V LEFT COMPARISON:  None. FINDINGS: Comminuted impacted left femoral neck fracture with mild apex lateral angulation and 15 mm superior displacement of the dominant distal fracture fragment. No dislocation of the left femoral head at the left hip joint. No additional fracture. No suspicious focal osseous lesion. No radiopaque foreign body. IMPRESSION: Comminuted impacted  left femoral neck fracture as detailed. Electronically Signed   By: Ilona Sorrel M.D.   On: 02/12/2017 16:52    EKG: Independently reviewed. na  Assessment/Plan Active Problems:   Hip fracture [facility])  This is a 81 year old female who is physically  fairly active, no significant physical impairment in her activities of daily leaving which include climbing steps. On the physical examination she is hemodynamically stable, blood pressure 186/76, temperature 97.6, heart rate 84, respiratory rate 16, oxygen saturation 100%. Her mucosa is moist, her lungs are clear to auscultation, heart S1-S2 present rhythmic, lower extremities no edema. Sodium 140, potassium 3.3, chloride 102, glucose 120, BUN 15, creatinine 0.50, hemoglobin 15, hematocrit 44. Chest x-ray personally review showing no infiltrates, effusions or stents pneumothorax. Left hip x-ray showing comminuted impacted left femoral neck fracture.  Working diagnosis. Left hip pain due to comminuted impacted left femoral neck fracture.  1. Left hip fracture. The patient will be admitted to the medical ward, she will receive morphine as needed intravenously for pain control, orthopedics has been consulted for surgical intervention. Patient is physically active, no significant history of heart disease, she is low to moderate cardiovascular risk for orthopedic procedure. Will keep patient nothing by mouth after midnight, hydration with dextrose and normal saline at 75 per hour.   2. Hypertension. Continue blood pressure control with amlodipine and losartan. Will add hydralazine intravenously as needed for blood pressure systolic greater than 324.  3. Fibromyalgia. Continue nortriptyline as per usual home regimen.  DVT prophylaxis: enoxaparin  Code Status: Full  Family Communication: I spoke with the patient's family and all questions were addressed.  Disposition Plan: Home  Consults called: Orthopedics Admission status: inpatient   Kenniel Bergsma Gerome Apley MD [facility] Pager [phone]  If 7PM-7AM, please contact night-coverage [email or web address] Password [facility]  02/12/2017, 5:56 PM

## 2017-02-12 NOTE — ED Notes (Signed)
ED Provider at bedside. 

## 2017-02-12 NOTE — ED Triage Notes (Signed)
Pt arrives to [facility] via [facility] after falling in her kitchen earlier today. She fell to her left hip and believes it may be fractured. She reports 10/10 pain. Left leg is shortened and externally rotated. Pt received total of 200 mcg fentanyl by ems.

## 2017-02-12 NOTE — Progress Notes (Signed)
PHARMACIST - PHYSICIAN ORDER COMMUNICATION  CONCERNING: P&T Medication Policy on Herbal Medications  DESCRIPTION:  This patient's order for:  Co-enzyme Q10  has been noted.  This product(s) is classified as an "herbal" or natural product. Due to a lack of definitive safety studies or FDA approval, nonstandard manufacturing practices, plus the potential risk of unknown drug-drug interactions while on inpatient medications, the Pharmacy and Therapeutics Committee does not permit the use of "herbal" or natural products of this type within [facility].   ACTION TAKEN: The pharmacy department is unable to verify this order at this time and your patient has been informed of this safety policy. Please reevaluate patient's clinical condition at discharge and address if the herbal or natural product(s) should be resumed at that time.  Eudelia Bunch, Pharm.D. [phone] 02/12/2017 9:02 PM

## 2017-02-13 ENCOUNTER — Inpatient Hospital Stay (HOSPITAL_COMMUNITY): Payer: Medicare Other

## 2017-02-13 ENCOUNTER — Encounter (HOSPITAL_COMMUNITY): Payer: Self-pay | Admitting: Anesthesiology

## 2017-02-13 ENCOUNTER — Inpatient Hospital Stay (HOSPITAL_COMMUNITY): Payer: Medicare Other | Admitting: Anesthesiology

## 2017-02-13 ENCOUNTER — Encounter (HOSPITAL_COMMUNITY)
Admission: EM | Disposition: A | Discharge status: Home Health | Payer: Self-pay | Source: Home / Self Care | Attending: Internal Medicine

## 2017-02-13 HISTORY — PX: TOTAL HIP ARTHROPLASTY: SHX124

## 2017-02-13 LAB — COMPREHENSIVE METABOLIC PANEL
ALK PHOS: 34 U/L — AB (ref 38–126)
ALT: 17 U/L (ref 14–54)
ANION GAP: 10 (ref 5–15)
AST: 24 U/L (ref 15–41)
Albumin: 3.6 g/dL (ref 3.5–5.0)
BILIRUBIN TOTAL: 1 mg/dL (ref 0.3–1.2)
BUN: 10 mg/dL (ref 6–20)
CALCIUM: 8.7 mg/dL — AB (ref 8.9–10.3)
CO2: 25 mmol/L (ref 22–32)
CREATININE: 0.56 mg/dL (ref 0.44–1.00)
Chloride: 102 mmol/L (ref 101–111)
GFR calc Af Amer: >60 mL/min (ref >60)
GFR calc non Af Amer: >60 mL/min (ref >60)
GLUCOSE: 152 mg/dL — AB (ref 65–99)
Potassium: 3.4 mmol/L — ABNORMAL LOW (ref 3.5–5.1)
Sodium: 137 mmol/L (ref 135–145)
TOTAL PROTEIN: 6.6 g/dL (ref 6.5–8.1)

## 2017-02-13 LAB — GLUCOSE, CAPILLARY: Glucose-Capillary: 149 mg/dL — ABNORMAL HIGH (ref 65–99)

## 2017-02-13 LAB — CBC
HCT: 36.5 % (ref 36.0–46.0)
HEMOGLOBIN: 12.6 g/dL (ref 12.0–15.0)
MCH: 34 pg (ref 26.0–34.0)
MCHC: 34.5 g/dL (ref 30.0–36.0)
MCV: 98.4 fL (ref 78.0–100.0)
PLATELETS: 202 10*3/uL (ref 150–400)
RBC: 3.71 MIL/uL — ABNORMAL LOW (ref 3.87–5.11)
RDW: 13.1 % (ref 11.5–15.5)
WBC: 8.8 10*3/uL (ref 4.0–10.5)

## 2017-02-13 LAB — MRSA PCR SCREENING: MRSA BY PCR: NEGATIVE

## 2017-02-13 SURGERY — ARTHROPLASTY, HIP, TOTAL, ANTERIOR APPROACH
Anesthesia: General | Site: Hip | Laterality: Left

## 2017-02-13 MED ORDER — LIP MEDEX EX OINT
TOPICAL_OINTMENT | CUTANEOUS | Status: AC
  Filled 2017-02-13: qty 7

## 2017-02-13 MED ORDER — DOCUSATE SODIUM 100 MG PO CAPS: 100.0000 mg | ORAL_CAPSULE | Freq: Two times a day (BID) | ORAL | Status: DC

## 2017-02-13 MED ORDER — PHENYLEPHRINE HCL 10 MG/ML IJ SOLN
INTRAVENOUS | Status: DC | PRN
  Administered 2017-02-13: 25 ug via INTRAVENOUS

## 2017-02-13 MED ORDER — MAGNESIUM CITRATE 100 MG PO TABS: 100.0000 mg | ORAL_TABLET | Freq: Every day | ORAL | Status: DC

## 2017-02-13 MED ORDER — HYDROMORPHONE HCL 2 MG/ML IJ SOLN
INTRAMUSCULAR | Status: AC
  Filled 2017-02-13: qty 1

## 2017-02-13 MED ORDER — CEFAZOLIN SODIUM-DEXTROSE 2-4 GM/100ML-% IV SOLN: 2.0000 g | INTRAVENOUS | Status: DC

## 2017-02-13 MED ORDER — DEXAMETHASONE SODIUM PHOSPHATE 10 MG/ML IJ SOLN: 8.0000 mg | Freq: Once | INTRAMUSCULAR | Status: DC

## 2017-02-13 MED ORDER — FENTANYL CITRATE (PF) 250 MCG/5ML IJ SOLN
INTRAMUSCULAR | Status: AC
  Filled 2017-02-13: qty 5

## 2017-02-13 MED ORDER — PHENYLEPHRINE 40 MCG/ML (10ML) SYRINGE FOR IV PUSH (FOR BLOOD PRESSURE SUPPORT)
PREFILLED_SYRINGE | INTRAVENOUS | Status: AC
  Filled 2017-02-13: qty 10

## 2017-02-13 MED ORDER — SCOPOLAMINE 1 MG/3DAYS TD PT72
MEDICATED_PATCH | TRANSDERMAL | Status: DC | PRN
  Administered 2017-02-13: 1 via TRANSDERMAL

## 2017-02-13 MED ORDER — CEFAZOLIN SODIUM-DEXTROSE 2-3 GM-% IV SOLR
INTRAVENOUS | Status: DC | PRN
  Administered 2017-02-13: 2 g via INTRAVENOUS

## 2017-02-13 MED ORDER — LACTATED RINGERS IV SOLN: INTRAVENOUS | Status: DC | PRN

## 2017-02-13 MED ORDER — CHLORHEXIDINE GLUCONATE 4 % EX LIQD: 60.0000 mL | Freq: Once | CUTANEOUS | Status: DC

## 2017-02-13 MED ORDER — MENTHOL 3 MG MT LOZG: 1.0000 | LOZENGE | OROMUCOSAL | Status: DC | PRN

## 2017-02-13 MED ORDER — HYDROMORPHONE HCL 1 MG/ML IJ SOLN
0.5000 mg | INTRAMUSCULAR | Status: DC | PRN
  Administered 2017-02-13: 1 mg via INTRAVENOUS
  Filled 2017-02-13 (×3): qty 1

## 2017-02-13 MED ORDER — EPHEDRINE SULFATE 50 MG/ML IJ SOLN
INTRAMUSCULAR | Status: DC | PRN
  Administered 2017-02-13: 10 mg via INTRAVENOUS

## 2017-02-13 MED ORDER — PROPOFOL 10 MG/ML IV BOLUS
INTRAVENOUS | Status: DC | PRN
  Administered 2017-02-13: 100 mg via INTRAVENOUS

## 2017-02-13 MED ORDER — KETOROLAC TROMETHAMINE 30 MG/ML IJ SOLN: 30.0000 mg | Freq: Once | INTRAMUSCULAR | Status: DC

## 2017-02-13 MED ORDER — METHOCARBAMOL 500 MG PO TABS
500.0000 mg | ORAL_TABLET | Freq: Four times a day (QID) | ORAL | Status: DC | PRN
  Administered 2017-02-13 – 2017-02-15 (×5): 500 mg via ORAL
  Filled 2017-02-13 (×5): qty 1

## 2017-02-13 MED ORDER — FERROUS SULFATE 325 (65 FE) MG PO TABS
325.0000 mg | ORAL_TABLET | Freq: Two times a day (BID) | ORAL | Status: DC
  Administered 2017-02-14 – 2017-02-15 (×3): 325 mg via ORAL
  Filled 2017-02-13 (×3): qty 1

## 2017-02-13 MED ORDER — ONDANSETRON HCL 4 MG PO TABS: 4.0000 mg | ORAL_TABLET | Freq: Four times a day (QID) | ORAL | Status: DC | PRN

## 2017-02-13 MED ORDER — PROMETHAZINE HCL 25 MG/ML IJ SOLN: 6.2500 mg | INTRAMUSCULAR | Status: DC | PRN

## 2017-02-13 MED ORDER — DEXAMETHASONE SODIUM PHOSPHATE 10 MG/ML IJ SOLN
INTRAMUSCULAR | Status: DC | PRN
  Administered 2017-02-13: 10 mg via INTRAVENOUS

## 2017-02-13 MED ORDER — CEFAZOLIN SODIUM-DEXTROSE 2-4 GM/100ML-% IV SOLN
INTRAVENOUS | Status: AC
  Filled 2017-02-13: qty 100

## 2017-02-13 MED ORDER — ASPIRIN EC 325 MG PO TBEC
325.0000 mg | DELAYED_RELEASE_TABLET | Freq: Every day | ORAL | Status: DC
  Administered 2017-02-14 – 2017-02-15 (×2): 325 mg via ORAL
  Filled 2017-02-13 (×2): qty 1

## 2017-02-13 MED ORDER — ACETAMINOPHEN 10 MG/ML IV SOLN
INTRAVENOUS | Status: AC
  Filled 2017-02-13: qty 100

## 2017-02-13 MED ORDER — SUCCINYLCHOLINE CHLORIDE 20 MG/ML IJ SOLN
INTRAMUSCULAR | Status: DC | PRN
  Administered 2017-02-13: 80 mg via INTRAVENOUS

## 2017-02-13 MED ORDER — LOSARTAN POTASSIUM 25 MG PO TABS
25.0000 mg | ORAL_TABLET | Freq: Every day | ORAL | Status: DC
  Administered 2017-02-14 – 2017-02-15 (×2): 25 mg via ORAL
  Filled 2017-02-13 (×3): qty 1

## 2017-02-13 MED ORDER — MEPERIDINE HCL 50 MG/ML IJ SOLN: 6.2500 mg | INTRAMUSCULAR | Status: DC | PRN

## 2017-02-13 MED ORDER — TRANEXAMIC ACID 1000 MG/10ML IV SOLN
1000.0000 mg | INTRAVENOUS | Status: AC
  Administered 2017-02-13: 1000 mg via INTRAVENOUS
  Filled 2017-02-13: qty 1100

## 2017-02-13 MED ORDER — VITAMINS A & D EX OINT
TOPICAL_OINTMENT | CUTANEOUS | Status: AC
  Filled 2017-02-13: qty 5

## 2017-02-13 MED ORDER — SODIUM CHLORIDE 0.9 % IR SOLN
Status: DC | PRN
  Administered 2017-02-13: 1000 mL

## 2017-02-13 MED ORDER — ONDANSETRON HCL 4 MG/2ML IJ SOLN
INTRAMUSCULAR | Status: AC
  Filled 2017-02-13: qty 2

## 2017-02-13 MED ORDER — ONDANSETRON HCL 4 MG/2ML IJ SOLN: 4.0000 mg | Freq: Four times a day (QID) | INTRAMUSCULAR | Status: DC | PRN

## 2017-02-13 MED ORDER — SODIUM CHLORIDE 0.9 % IV SOLN
INTRAVENOUS | Status: DC | PRN
  Administered 2017-02-13: 15:00:00 via INTRAVENOUS

## 2017-02-13 MED ORDER — ONDANSETRON HCL 4 MG/2ML IJ SOLN
INTRAMUSCULAR | Status: DC | PRN
  Administered 2017-02-13: 4 mg via INTRAVENOUS

## 2017-02-13 MED ORDER — POVIDONE-IODINE 10 % EX SWAB: 2.0000 "application " | Freq: Once | CUTANEOUS | Status: DC

## 2017-02-13 MED ORDER — PROPOFOL 10 MG/ML IV BOLUS
INTRAVENOUS | Status: AC
  Filled 2017-02-13: qty 20

## 2017-02-13 MED ORDER — LOSARTAN POTASSIUM 25 MG PO TABS: 25.0000 mg | ORAL_TABLET | Freq: Every day | ORAL | Status: DC

## 2017-02-13 MED ORDER — HYDROCODONE-ACETAMINOPHEN 5-325 MG PO TABS
1.0000 | ORAL_TABLET | Freq: Four times a day (QID) | ORAL | Status: DC | PRN
  Administered 2017-02-14: 09:00:00 1 via ORAL
  Filled 2017-02-13: qty 2

## 2017-02-13 MED ORDER — MORPHINE SULFATE (PF) 4 MG/ML IV SOLN
1.0000 mg | INTRAVENOUS | Status: DC | PRN
  Administered 2017-02-13 (×3): 1 mg via INTRAVENOUS
  Filled 2017-02-13 (×3): qty 1

## 2017-02-13 MED ORDER — ALUM & MAG HYDROXIDE-SIMETH 200-200-20 MG/5ML PO SUSP: 30.0000 mL | ORAL | Status: DC | PRN

## 2017-02-13 MED ORDER — SODIUM CHLORIDE 0.9 % IV SOLN
INTRAVENOUS | Status: DC
  Administered 2017-02-13: 20:00:00 via INTRAVENOUS

## 2017-02-13 MED ORDER — PHENYLEPHRINE HCL 10 MG/ML IJ SOLN
INTRAMUSCULAR | Status: DC | PRN
  Administered 2017-02-13: 80 ug via INTRAVENOUS

## 2017-02-13 MED ORDER — EPHEDRINE 5 MG/ML INJ
INTRAVENOUS | Status: AC
  Filled 2017-02-13: qty 10

## 2017-02-13 MED ORDER — FENTANYL CITRATE (PF) 100 MCG/2ML IJ SOLN
25.0000 ug | INTRAMUSCULAR | Status: DC | PRN
  Administered 2017-02-13 (×3): 25 ug via INTRAVENOUS

## 2017-02-13 MED ORDER — METOCLOPRAMIDE HCL 5 MG PO TABS: 5.0000 mg | ORAL_TABLET | Freq: Three times a day (TID) | ORAL | Status: DC | PRN

## 2017-02-13 MED ORDER — ACETAMINOPHEN 650 MG RE SUPP: 650.0000 mg | Freq: Four times a day (QID) | RECTAL | Status: DC | PRN

## 2017-02-13 MED ORDER — TRANEXAMIC ACID 1000 MG/10ML IV SOLN: 1000.0000 mg | INTRAVENOUS | Status: DC

## 2017-02-13 MED ORDER — FENTANYL CITRATE (PF) 100 MCG/2ML IJ SOLN
INTRAMUSCULAR | Status: AC
  Filled 2017-02-13: qty 2

## 2017-02-13 MED ORDER — DEXAMETHASONE SODIUM PHOSPHATE 10 MG/ML IJ SOLN
INTRAMUSCULAR | Status: AC
  Filled 2017-02-13: qty 1

## 2017-02-13 MED ORDER — FENTANYL CITRATE (PF) 100 MCG/2ML IJ SOLN
INTRAMUSCULAR | Status: DC | PRN
  Administered 2017-02-13 (×3): 50 ug via INTRAVENOUS

## 2017-02-13 MED ORDER — LACTATED RINGERS IV SOLN
INTRAVENOUS | Status: DC | PRN
  Administered 2017-02-13: 16:00:00 via INTRAVENOUS

## 2017-02-13 MED ORDER — PHENOL 1.4 % MT LIQD: 1.0000 | OROMUCOSAL | Status: DC | PRN

## 2017-02-13 MED ORDER — METHOCARBAMOL 1000 MG/10ML IJ SOLN
500.0000 mg | Freq: Four times a day (QID) | INTRAVENOUS | Status: DC | PRN
  Filled 2017-02-13: qty 5

## 2017-02-13 MED ORDER — ACETAMINOPHEN 10 MG/ML IV SOLN
INTRAVENOUS | Status: DC | PRN
  Administered 2017-02-13: 1000 mg via INTRAVENOUS

## 2017-02-13 MED ORDER — CEFAZOLIN IN D5W 1 GM/50ML IV SOLN
1.0000 g | Freq: Four times a day (QID) | INTRAVENOUS | Status: AC
  Administered 2017-02-13 – 2017-02-14 (×2): 1 g via INTRAVENOUS
  Filled 2017-02-13 (×2): qty 50

## 2017-02-13 MED ORDER — METOCLOPRAMIDE HCL 5 MG/ML IJ SOLN: 5.0000 mg | Freq: Three times a day (TID) | INTRAMUSCULAR | Status: DC | PRN

## 2017-02-13 MED ORDER — POLYETHYLENE GLYCOL 3350 17 G PO PACK: 17.0000 g | PACK | Freq: Every day | ORAL | Status: DC | PRN

## 2017-02-13 MED ORDER — ACETAMINOPHEN 325 MG PO TABS
650.0000 mg | ORAL_TABLET | Freq: Four times a day (QID) | ORAL | Status: DC | PRN
  Administered 2017-02-14: 16:00:00 650 mg via ORAL
  Filled 2017-02-13 (×2): qty 2

## 2017-02-13 MED ORDER — PHENYLEPHRINE HCL 10 MG/ML IJ SOLN
INTRAMUSCULAR | Status: AC
  Filled 2017-02-13: qty 1

## 2017-02-13 MED ORDER — LIDOCAINE HCL (CARDIAC) 10 MG/ML IV SOLN
INTRAVENOUS | Status: DC | PRN
  Administered 2017-02-13: 25 mg via INTRAVENOUS
  Administered 2017-02-13: 50 mg via INTRAVENOUS

## 2017-02-13 MED ORDER — ENOXAPARIN SODIUM 40 MG/0.4ML ~~LOC~~ SOLN: 40.0000 mg | SUBCUTANEOUS | Status: DC

## 2017-02-13 MED ORDER — SCOPOLAMINE 1 MG/3DAYS TD PT72
MEDICATED_PATCH | TRANSDERMAL | Status: AC
  Filled 2017-02-13: qty 1

## 2017-02-13 SURGICAL SUPPLY — 36 items
BAG DECANTER FOR FLEXI CONT (MISCELLANEOUS) IMPLANT
BAG ZIPLOCK 12X15 (MISCELLANEOUS) IMPLANT
BLADE SAG 18X100X1.27 (BLADE) ×3 IMPLANT
CAPT HIP TOTAL 2 ×3 IMPLANT
CLOTH BEACON ORANGE TIMEOUT ST (SAFETY) ×3 IMPLANT
COVER PERINEAL POST (MISCELLANEOUS) ×3 IMPLANT
DERMABOND ADVANCED (GAUZE/BANDAGES/DRESSINGS) ×2
DERMABOND ADVANCED .7 DNX12 (GAUZE/BANDAGES/DRESSINGS) ×1 IMPLANT
DRAPE STERI IOBAN 125X83 (DRAPES) ×3 IMPLANT
DRAPE U-SHAPE 47X51 STRL (DRAPES) ×6 IMPLANT
DRESSING AQUACEL AG SP 3.5X10 (GAUZE/BANDAGES/DRESSINGS) ×1 IMPLANT
DRSG AQUACEL AG ADV 3.5X10 (GAUZE/BANDAGES/DRESSINGS) ×3 IMPLANT
DRSG AQUACEL AG SP 3.5X10 (GAUZE/BANDAGES/DRESSINGS) ×3
DURAPREP 26ML APPLICATOR (WOUND CARE) ×3 IMPLANT
ELECT REM PT RETURN 15FT ADLT (MISCELLANEOUS) IMPLANT
ELECT REM PT RETURN 9FT ADLT (ELECTROSURGICAL) ×3
ELECTRODE REM PT RTRN 9FT ADLT (ELECTROSURGICAL) ×1 IMPLANT
GLOVE BIOGEL M STRL SZ7.5 (GLOVE) IMPLANT
GLOVE BIOGEL PI IND STRL 7.5 (GLOVE) ×1 IMPLANT
GLOVE BIOGEL PI IND STRL 8.5 (GLOVE) ×1 IMPLANT
GLOVE BIOGEL PI INDICATOR 7.5 (GLOVE) ×2
GLOVE BIOGEL PI INDICATOR 8.5 (GLOVE) ×2
GLOVE ECLIPSE 8.0 STRL XLNG CF (GLOVE) ×6 IMPLANT
GLOVE ORTHO TXT STRL SZ7.5 (GLOVE) ×3 IMPLANT
GOWN STRL REUS W/TWL LRG LVL3 (GOWN DISPOSABLE) ×3 IMPLANT
GOWN STRL REUS W/TWL XL LVL3 (GOWN DISPOSABLE) ×3 IMPLANT
HOLDER FOLEY CATH W/STRAP (MISCELLANEOUS) ×3 IMPLANT
PACK ANTERIOR HIP CUSTOM (KITS) ×3 IMPLANT
SUT MNCRL AB 4-0 PS2 18 (SUTURE) ×3 IMPLANT
SUT VIC AB 1 CT1 36 (SUTURE) ×9 IMPLANT
SUT VIC AB 2-0 CT1 27 (SUTURE) ×4
SUT VIC AB 2-0 CT1 TAPERPNT 27 (SUTURE) ×2 IMPLANT
SUT VLOC 180 0 24IN GS25 (SUTURE) ×3 IMPLANT
TRAY FOLEY W/METER SILVER 16FR (SET/KITS/TRAYS/PACK) IMPLANT
WATER STERILE IRR 1500ML POUR (IV SOLUTION) ×3 IMPLANT
YANKAUER SUCT BULB TIP 10FT TU (MISCELLANEOUS) IMPLANT

## 2017-02-13 NOTE — Op Note (Signed)
NAME:  Kara Campos                ACCOUNT NO.: [id number]      MEDICAL RECORD NO.: [id number]      FACILITY:  [facility]      PHYSICIAN:  Paralee Cancel D  DATE OF BIRTH:  1936/04/03     DATE OF PROCEDURE:  02/13/2017                                 OPERATIVE REPORT         PREOPERATIVE DIAGNOSIS: Displaced left femoral neck fracture.      POSTOPERATIVE DIAGNOSIS:  Displaced left femoral neck fracture     PROCEDURE:  Left total hip replacement through an anterior approach   utilizing DePuy THR system, component size 58mm pinnacle cup, a size 32+4 neutral   Altrex liner, a size 2 Hi Tri Lock stem with a 32+9 delta ceramic   ball.      SURGEON:  Pietro Cassis. Alvan Dame, M.D.      ASSISTANT:  Surgical team     ANESTHESIA:  General.      SPECIMENS:  None.      COMPLICATIONS:  None.      BLOOD LOSS:  300 cc     DRAINS:  None.      INDICATION OF THE PROCEDURE:  Kara Campos is a 81 y.o. female who unfortunately had a ground level fall when she tripped and fell at home.  She was admitted through the ER to the hospitalist service per protocol.  She was seen and evaluated and findings and plans reviewed with family and patient.  Reviewed risks and benefits, pros and cons of total versus hemiarthroplasty.  Based on her age, health and desired activity level we decided that a total hip was best option. Consent was obtained for   benefit of pain relief.  Specific risk of infection, DVT, component   failure, dislocation, need for revision surgery, as well discussion of   the anterior versus posterior approach were reviewed.  Consent was   obtained for benefit of anterior pain relief through an anterior   approach.      PROCEDURE IN DETAIL:  The patient was brought to operative theater.   Once adequate anesthesia, preoperative antibiotics, 2gm of Ancef, 1 gm of Tranexamic Acid, and decadron administered.   The patient was positioned supine on the OSI Hanna table.  Once  adequate   padding of boney process was carried out, we had predraped out the hip, and  used fluoroscopy to confirm orientation of the pelvis and position.      The left hip was then prepped and draped from proximal iliac crest to   mid thigh with shower curtain technique.      Time-out was performed identifying the patient, planned procedure, and   extremity.     An incision was then made 2 cm distal and lateral to the   anterior superior iliac spine extending over the orientation of the   tensor fascia lata muscle and sharp dissection was carried down to the   fascia of the muscle and protractor placed in the soft tissues.      The fascia was then incised.  The muscle belly was identified and swept   laterally and retractor placed along the superior neck.  Following   cauterization of the circumflex vessels and removing some  pericapsular   fat, a second cobra retractor was placed on the inferior neck.  A third   retractor was placed on the anterior acetabulum after elevating the   anterior rectus.  A L-capsulotomy was along the line of the   superior neck to the trochanteric fossa, then extended proximally and   distally.  Tag sutures were placed and the retractors were then placed   intracapsular.  We then identified the trochanteric fossa and   orientation of my neck cut, confirmed this radiographically   and then made a neck osteotomy with the femur on traction.  The femoral   head was removed without difficulty or complication.  Traction was let   off and retractors were placed posterior and anterior around the   acetabulum.      The labrum and foveal tissue were debrided.  I began reaming with a 61mm   reamer and reamed up to 51mm reamer with good bony bed preparation and a 5mm   cup was chosen.  The final 47mm Pinnacle cup was then impacted under fluoroscopy  to confirm the depth of penetration and orientation with respect to   abduction.  A screw was placed followed by the  hole eliminator.  The final   32+4 neutral Altrex liner was impacted with good visualized rim fit.  The cup was positioned anatomically within the acetabular portion of the pelvis.      At this point, the femur was rolled at 80 degrees.  Further capsule was   released off the inferior aspect of the femoral neck.  I then   released the superior capsule proximally.  The hook was placed laterally   along the femur and elevated manually and held in position with the bed   hook.  The leg was then extended and adducted with the leg rolled to 100   degrees of external rotation.  Once the proximal femur was fully   exposed, I used a box osteotome to set orientation.  I then began   broaching with the starting chili pepper broach and passed this by hand and then broached up to 2.  With the 2 broach in place I chose a high offset neck and did several trial reductions.  The offset was appropriate and leg lengths   appeared to be equal best matched with the +9 head ball confirmed radiographically.   Given these findings, I went ahead and dislocated the hip, repositioned all   retractors and positioned the right hip in the extended and abducted position.  The final 2 Hi Tri Lock stem was   chosen and it was impacted down to the level of neck cut.  Based on this   and the trial reduction, a 32+9 delta ceramic ball was chosen and   impacted onto a clean and dry trunnion, and the hip was reduced.  The   hip had been irrigated throughout the case again at this point.  I did   reapproximate the superior capsular leaflet to the anterior leaflet   using #1 Vicryl.  The fascia of the   tensor fascia lata muscle was then reapproximated using #1 Vicryl.  The   remaining wound was closed with 2-0 Vicryl and running 4-0 Monocryl.   The hip was cleaned, dried, and dressed sterilely using Dermabond and   Aquacel dressing.  She was then brought   to recovery room in stable condition tolerating the procedure well.  Pietro Cassis Alvan Dame, M.D.        02/13/2017 1:55 PM

## 2017-02-13 NOTE — Anesthesia Procedure Notes (Signed)
Procedures

## 2017-02-13 NOTE — Anesthesia Postprocedure Evaluation (Addendum)
Anesthesia Post Note  Patient: Kara Campos  Procedure(s) Performed: Procedure(s) (LRB): TOTAL HIP ARTHROPLASTY ANTERIOR APPROACH (Left)  Patient location during evaluation: PACU Anesthesia Type: General Level of consciousness: awake and sedated Pain management: pain level controlled Vital Signs Assessment: post-procedure vital signs reviewed and stable Respiratory status: spontaneous breathing Cardiovascular status: stable Postop Assessment: no signs of nausea or vomiting Anesthetic complications: no        Last Vitals:  Vitals:   02/13/17 0607 02/13/17 1419  BP: (!) 160/55 (!) 151/54  Pulse: 86 75  Resp: 18 14  Temp: 37.3 C 36.8 C    Last Pain:  Vitals:   02/13/17 1419  TempSrc: Oral  PainSc:    Pain Goal: Patients Stated Pain Goal: 4 (02/13/17 1411)               Izella Ybanez JR,JOHN Mateo Flow

## 2017-02-13 NOTE — Consult Note (Signed)
Reason for Consult: Left hip fracture Referring Physician: Cathlean Sauer, MD ( Hospitalist service)  Kara Campos is an 81 y.o. female.  HPI: Kara Campos is a 81 y.o. female with medical history significant of hypertension and fibromyalgia, presents with sever left hip pain. Patient was at her usual state of health until this morning when she tripped and fell, landing on her left side. No head trauma or loss of consciousness. After the fall patient developed excruciating pain on her left hip, 10 out of 10 intensity, sharp in nature, worse with movement, no improving factors, no significant radiation, the pain was severe enough that she could not stand back on her feet, was brought to the hospital for further evaluation.  Patient is physically active, she is able to climb steps with no major problems. Denies any chest pain or shortness of breath with exertion.  Kara Campos is the grandmother of surgical technician, Kara Campos.  Kara Campos contacted me with regards to her fall and asked if I could participate in her care.  No other complaints, Orthopaedically  Past Medical History:  Diagnosis Date  . Arthritis   . Blood transfusion   . Diabetes mellitus    borderline  . Hemorrhoids   . Hyperlipidemia   . Osteoporosis     Past Surgical History:  Procedure Laterality Date  . VAGINAL HYSTERECTOMY  1985    Family History  Problem Relation Age of Onset  . Ovarian cancer Maternal Grandmother     Social History:  reports that she has been smoking Cigarettes.  She has been smoking about 0.50 packs per day. She has never used smokeless tobacco. She reports that she drinks alcohol. She reports that she does not use drugs.  Allergies:  Allergies  Allergen Reactions  . Codeine Nausea Only  . Sulfa Antibiotics Nausea And Vomiting    Medications: I have reviewed the patient's current medications.  Results for orders placed or performed during the hospital encounter of 02/12/17 (from the past  24 hour(s))  Basic metabolic panel     Status: Abnormal   Collection Time: 02/12/17  5:23 PM  Result Value Ref Range   Sodium 138 135 - 145 mmol/L   Potassium 3.2 (L) 3.5 - 5.1 mmol/L   Chloride 103 101 - 111 mmol/L   CO2 22 22 - 32 mmol/L   Glucose, Bld 116 (H) 65 - 99 mg/dL   BUN 16 6 - 20 mg/dL   Creatinine, Ser 0.58 0.44 - 1.00 mg/dL   Calcium 9.4 8.9 - 10.3 mg/dL   GFR calc non Af Amer >60 >60 mL/min   GFR calc Af Amer >60 >60 mL/min   Anion gap 13 5 - 15  CBC WITH DIFFERENTIAL     Status: Abnormal   Collection Time: 02/12/17  5:23 PM  Result Value Ref Range   WBC 10.9 (H) 4.0 - 10.5 K/uL   RBC 4.31 3.87 - 5.11 MIL/uL   Hemoglobin 14.2 12.0 - 15.0 g/dL   HCT 42.1 36.0 - 46.0 %   MCV 97.7 78.0 - 100.0 fL   MCH 32.9 26.0 - 34.0 pg   MCHC 33.7 30.0 - 36.0 g/dL   RDW 13.0 11.5 - 15.5 %   Platelets 240 150 - 400 K/uL   Neutrophils Relative % 75 %   Neutro Abs 8.1 (H) 1.7 - 7.7 K/uL   Lymphocytes Relative 19 %   Lymphs Abs 2.0 0.7 - 4.0 K/uL   Monocytes Relative 6 %   Monocytes  Absolute 0.7 0.1 - 1.0 K/uL   Eosinophils Relative 0 %   Eosinophils Absolute 0.0 0.0 - 0.7 K/uL   Basophils Relative 0 %   Basophils Absolute 0.0 0.0 - 0.1 K/uL  Protime-INR     Status: None   Collection Time: 02/12/17  5:23 PM  Result Value Ref Range   Prothrombin Time 13.2 11.4 - 15.2 seconds   INR 1.00   Type and screen [place]     Status: None   Collection Time: 02/12/17  5:24 PM  Result Value Ref Range   ABO/RH(D) A POS    Antibody Screen NEG    Sample Expiration 02/15/2017   ABO/Rh     Status: None   Collection Time: 02/12/17  5:24 PM  Result Value Ref Range   ABO/RH(D) A POS   I-Stat Chem 8, ED     Status: Abnormal   Collection Time: 02/12/17  5:42 PM  Result Value Ref Range   Sodium 140 135 - 145 mmol/L   Potassium 3.3 (L) 3.5 - 5.1 mmol/L   Chloride 102 101 - 111 mmol/L   BUN 15 6 - 20 mg/dL   Creatinine, Ser 0.50 0.44 - 1.00 mg/dL   Glucose, Bld 120  (H) 65 - 99 mg/dL   Calcium, Ion 1.11 (L) 1.15 - 1.40 mmol/L   TCO2 24 0 - 100 mmol/L   Hemoglobin 15.0 12.0 - 15.0 g/dL   HCT 44.0 36.0 - 46.0 %  Comprehensive metabolic panel     Status: Abnormal   Collection Time: 02/13/17  4:56 AM  Result Value Ref Range   Sodium 137 135 - 145 mmol/L   Potassium 3.4 (L) 3.5 - 5.1 mmol/L   Chloride 102 101 - 111 mmol/L   CO2 25 22 - 32 mmol/L   Glucose, Bld 152 (H) 65 - 99 mg/dL   BUN 10 6 - 20 mg/dL   Creatinine, Ser 0.56 0.44 - 1.00 mg/dL   Calcium 8.7 (L) 8.9 - 10.3 mg/dL   Total Protein 6.6 6.5 - 8.1 g/dL   Albumin 3.6 3.5 - 5.0 g/dL   AST 24 15 - 41 U/L   ALT 17 14 - 54 U/L   Alkaline Phosphatase 34 (L) 38 - 126 U/L   Total Bilirubin 1.0 0.3 - 1.2 mg/dL   GFR calc non Af Amer >60 >60 mL/min   GFR calc Af Amer >60 >60 mL/min   Anion gap 10 5 - 15  CBC     Status: Abnormal   Collection Time: 02/13/17  4:56 AM  Result Value Ref Range   WBC 8.8 4.0 - 10.5 K/uL   RBC 3.71 (L) 3.87 - 5.11 MIL/uL   Hemoglobin 12.6 12.0 - 15.0 g/dL   HCT 36.5 36.0 - 46.0 %   MCV 98.4 78.0 - 100.0 fL   MCH 34.0 26.0 - 34.0 pg   MCHC 34.5 30.0 - 36.0 g/dL   RDW 13.1 11.5 - 15.5 %   Platelets 202 150 - 400 K/uL  MRSA PCR Screening     Status: None   Collection Time: 02/13/17 11:25 AM  Result Value Ref Range   MRSA by PCR NEGATIVE NEGATIVE    X-ray: CLINICAL DATA:  Fall today with left hip pain.  EXAM: DG HIP (WITH OR WITHOUT PELVIS) 2-3V LEFT  COMPARISON:  None.  FINDINGS: Comminuted impacted left femoral neck fracture with mild apex lateral angulation and 15 mm superior displacement of the dominant distal fracture fragment. No  dislocation of the left femoral head at the left hip joint. No additional fracture. No suspicious focal osseous lesion. No radiopaque foreign body.  IMPRESSION: Comminuted impacted left femoral neck fracture as detailed  ROS  No recent hospitalizations, no injuries or recent illnesses Other than reported injury  in HPI  1. General. No fevers or chills 2. ENT no runny nose or sore throat 3. Gastrointestinal no nausea vomiting or diarrhea 4. Pulmonary no shortness of breath cough or hemoptysis 5. Cardiovascular, no angina no claudication, no PND orthopnea 6. Hematology no easy bruisability or frequent infections 7. Urology no dysuria or increased urinary frequency 8. Dermatology no rashes 9. Psych no depression or anxiety 10. Neurology seizures or paresthesias  Blood pressure (!) 160/55, pulse 86, temperature 99.2 F (37.3 C), temperature source Oral, resp. rate 18, SpO2 97 %.  Physical Exam  Awake alert oriented Left LE short and externally rotated NVI, pain with movement  General medical exam reviewed from ER and admission for pertinent findings   Assessment/Plan: Displaced left femoral neck fracture in a otherwise healy and active 81 yo female  Upon review with family and patient we will proceed with a left THR for best long term management and functional return NPO Consent ordered WBAT post op Disposition pending PT eval, family help  Lesette Frary D 02/13/2017, 1:48 PM

## 2017-02-13 NOTE — Transfer of Care (Signed)
Immediate Anesthesia Transfer of Care Note  Patient: Kara Campos  Procedure(s) Performed: Procedure(s): TOTAL HIP ARTHROPLASTY ANTERIOR APPROACH (Left)  Patient Location: PACU  Anesthesia Type:General  Level of Consciousness: awake, alert , oriented and patient cooperative  Airway & Oxygen Therapy: Patient Spontanous Breathing and Patient connected to face mask oxygen  Post-op Assessment: Report given to RN, Post -op Vital signs reviewed and stable and Patient moving all extremities X 4  Post vital signs: stable  Last Vitals:  Vitals:   02/13/17 0607 02/13/17 1419  BP: (!) 160/55 (!) 151/54  Pulse: 86 75  Resp: 18 14  Temp: 37.3 C 36.8 C    Last Pain:  Vitals:   02/13/17 1419  TempSrc: Oral  PainSc:       Patients Stated Pain Goal: 4 ([birth year] 8875)  Complications: No apparent anesthesia complications

## 2017-02-13 NOTE — Anesthesia Preprocedure Evaluation (Addendum)
Anesthesia Evaluation  Patient identified by MRN, date of birth, ID band Patient awake    Reviewed: Allergy & Precautions, NPO status , Patient's Chart, lab work & pertinent test results  Airway Mallampati: II       Dental no notable dental hx.    Pulmonary Current Smoker,    Pulmonary exam normal        Cardiovascular hypertension, Pt. on medications Normal cardiovascular exam     Neuro/Psych negative neurological ROS     GI/Hepatic negative GI ROS, Neg liver ROS,   Endo/Other  negative endocrine ROSdiabetes  Renal/GU negative Renal ROS  negative genitourinary   Musculoskeletal   Abdominal Normal abdominal exam  (+)   Peds negative pediatric ROS (+)  Hematology negative hematology ROS (+)   Anesthesia Other Findings   Reproductive/Obstetrics negative OB ROS                            Anesthesia Physical Anesthesia Plan  ASA: II  Anesthesia Plan: General   Post-op Pain Management:    Induction: Intravenous  Airway Management Planned: Oral ETT  Additional Equipment:   Intra-op Plan:   Post-operative Plan: Extubation in OR  Informed Consent: I have reviewed the patients History and Physical, chart, labs and discussed the procedure including the risks, benefits and alternatives for the proposed anesthesia with the patient or authorized representative who has indicated his/her understanding and acceptance.     Plan Discussed with: CRNA and Surgeon  Anesthesia Plan Comments:        Anesthesia Quick Evaluation

## 2017-02-13 NOTE — Anesthesia Procedure Notes (Signed)
Procedure Name: Intubation Date/Time: 02/13/2017 3:31 PM Performed by: Lissa Morales Pre-anesthesia Checklist: Patient identified, Emergency Drugs available, Suction available and Patient being monitored Patient Re-evaluated:Patient Re-evaluated prior to inductionOxygen Delivery Method: Circle system utilized Preoxygenation: Pre-oxygenation with 100% oxygen Intubation Type: IV induction Ventilation: Mask ventilation without difficulty Laryngoscope Size: Mac and 4 Grade View: Grade I Tube type: Oral Tube size: 7.0 mm Number of attempts: 1 Airway Equipment and Method: Stylet and Oral airway Placement Confirmation: ETT inserted through vocal cords under direct vision,  positive ETCO2 and breath sounds checked- equal and bilateral Secured at: 21 cm Tube secured with: Tape Dental Injury: Teeth and Oropharynx as per pre-operative assessment

## 2017-02-13 NOTE — Progress Notes (Signed)
PROGRESS NOTE    Kara Campos  LKT:[id number] DOB: [birth year] DOA: 02/12/2017 PCP: [name], MD    Brief Narrative:  This is a 81 year old female who is physically  fairly active, no significant physical impairment in her activities of daily leaving which include climbing steps. On the physical examination she is hemodynamically stable, blood pressure 186/76, temperature 97.6, heart rate 84, respiratory rate 16, oxygen saturation 100%. Her mucosa is moist, her lungs are clear to auscultation, heart S1-S2 present rhythmic, lower extremities no edema. Sodium 140, potassium 3.3, chloride 102, glucose 120, BUN 15, creatinine 0.50, hemoglobin 15, hematocrit 44. Chest x-ray personally review showing no infiltrates, effusions or stents pneumothorax. Left hip x-ray showing comminuted impacted left femoral neck fracture. Patient admitted with the working diagnosis of: Left hip pain due to comminuted impacted left femoral neck fracture.   Assessment & Plan:   Active Problems:   Hip fracture ([place])   1. Left hip fracture. Increase morphine frequency to q 2 hours as needed. Continue NPO for now. Hold this am enoxaparin dose in preparation for possible orthopedic procedure.    2. HTN. Resume losartan, continue amlodipine and as needed hydralazine. Hydration with isotonic saline with dextrose.    3. Fibromyalgia. Continue nortriptyline.     DVT prophylaxis: enoxaparin  Code Status: full  Family Communication: I spoke with patient's family at the bedside and all questions were addressed.  Disposition Plan: home   Consultants:   Orthopedics   Procedures:     Antimicrobials:      Subjective: Patient complains of left lower extremity pain, moderate in intensity, worse with movement, 10/10 intensity, sharp in nature. Improved with morphine, worse with movement. Patient npo after midnight.   Objective: Vitals:   02/12/17 1900 02/12/17 2000 02/12/17 2118 02/13/17 0607  BP: (!) 182/87 (!)  188/85 (!) 174/74 (!) 160/55  Pulse: 90 95 88 86  Resp: (!) 21 18 18 18   Temp:   98.4 F (36.9 C) 99.2 F (37.3 C)  TempSrc:   Oral Oral  SpO2: 95% 94% 93% 97%    Intake/Output Summary (Last 24 hours) at 02/13/17 0750 Last data filed at 02/13/17 0600  Gross per 24 hour  Intake                0 ml  Output              550 ml  Net             -550 ml   There were no vitals filed for this visit.  Examination:  General exam: deconditioned E ENT. Mild pallor, no icterus, oral mucosa dry.  Respiratory system: Clear to auscultation. Respiratory effort normal. No wheezing, rales or rhonchi.  Cardiovascular system: S1 & S2 heard, RRR. No JVD, murmurs, rubs, gallops or clicks. No pedal edema. Gastrointestinal system: Abdomen is nondistended, soft and nontender. No organomegaly or masses felt. Normal bowel sounds heard. Central nervous system: Alert and oriented. No focal neurological deficits. Extremities: Symmetric 5 x 5 power. Shortening of left lower extremity, tender to passive movement.  Skin: No rashes, lesions or ulcers     Data Reviewed: I have personally reviewed following labs and imaging studies  CBC:  Recent Labs Lab 02/12/17 1723 02/12/17 1742 02/13/17 0456  WBC 10.9*  --  8.8  NEUTROABS 8.1*  --   --   HGB 14.2 15.0 12.6  HCT 42.1 44.0 36.5  MCV 97.7  --  98.4  PLT 240  --  628   Basic Metabolic Panel:  Recent Labs Lab 02/12/17 1723 02/12/17 1742 02/13/17 0456  NA 138 140 137  K 3.2* 3.3* 3.4*  CL 103 102 102  CO2 22  --  25  GLUCOSE 116* 120* 152*  BUN 16 15 10   CREATININE 0.58 0.50 0.56  CALCIUM 9.4  --  8.7*   GFR: CrCl cannot be calculated (Unknown ideal weight.). Liver Function Tests:  Recent Labs Lab 02/13/17 0456  AST 24  ALT 17  ALKPHOS 34*  BILITOT 1.0  PROT 6.6  ALBUMIN 3.6   No results for input(s): LIPASE, AMYLASE in the last 168 hours. No results for input(s): AMMONIA in the last 168 hours. Coagulation  Profile:  Recent Labs Lab 02/12/17 1723  INR 1.00   Cardiac Enzymes: No results for input(s): CKTOTAL, CKMB, CKMBINDEX, TROPONINI in the last 168 hours. BNP (last 3 results) No results for input(s): PROBNP in the last 8760 hours. HbA1C: No results for input(s): HGBA1C in the last 72 hours. CBG: No results for input(s): GLUCAP in the last 168 hours. Lipid Profile: No results for input(s): CHOL, HDL, LDLCALC, TRIG, CHOLHDL, LDLDIRECT in the last 72 hours. Thyroid Function Tests: No results for input(s): TSH, T4TOTAL, FREET4, T3FREE, THYROIDAB in the last 72 hours. Anemia Panel: No results for input(s): VITAMINB12, FOLATE, FERRITIN, TIBC, IRON, RETICCTPCT in the last 72 hours. Sepsis Labs: No results for input(s): PROCALCITON, LATICACIDVEN in the last 168 hours.  No results found for this or any previous visit (from the past 240 hour(s)).       Radiology Studies: Dg Chest 2 View  Result Date: 02/12/2017 CLINICAL DATA:  Pain following fall.  Hypertension. EXAM: CHEST  2 VIEW COMPARISON:  May 08, 2015 FINDINGS: There is no edema or consolidation. Heart size and pulmonary vascularity are normal. No adenopathy. Focal calcification is noted in the left breast. There is atherosclerotic calcification in the aorta. No pneumothorax. There is degenerative change in mid thoracic spine. No acute fracture evident. IMPRESSION: No edema or consolidation. Aortic atherosclerosis. No pneumothorax. Calcification noted in left breast. Electronically Signed   By: Lowella Grip III M.D.   On: 02/12/2017 16:51   Dg Hip Unilat W Or Wo Pelvis 2-3 Views Left  Result Date: 02/12/2017 CLINICAL DATA:  Fall today with left hip pain. EXAM: DG HIP (WITH OR WITHOUT PELVIS) 2-3V LEFT COMPARISON:  None. FINDINGS: Comminuted impacted left femoral neck fracture with mild apex lateral angulation and 15 mm superior displacement of the dominant distal fracture fragment. No dislocation of the left femoral head at the  left hip joint. No additional fracture. No suspicious focal osseous lesion. No radiopaque foreign body. IMPRESSION: Comminuted impacted left femoral neck fracture as detailed. Electronically Signed   By: Ilona Sorrel M.D.   On: 02/12/2017 16:52        Scheduled Meds: . amLODipine  5 mg Oral Daily  . calcium-vitamin D  1 tablet Oral Q breakfast  . cholecalciferol  1,000 Units Oral Daily  . docusate sodium  100 mg Oral BID  . enoxaparin (LOVENOX) injection  40 mg Subcutaneous Q24H  . losartan  25 mg Oral Daily  . nortriptyline  10 mg Oral QHS  . omega-3 acid ethyl esters  2 g Oral Daily  . timolol  1 drop Both Eyes Daily   Continuous Infusions: . dextrose 5 % and 0.9% NaCl 75 mL/hr (02/12/17 2208)     LOS: 1 day      Mauricio Gerome Apley, MD  [facility] Pager [phone]  If 7PM-7AM, please contact night-coverage [email or web address] Password [email or web address] 02/13/2017, 7:50 AM

## 2017-02-13 NOTE — Progress Notes (Signed)
Patient ID: Kara Campos, female   DOB: Nov 08, 1936, 81 y.o.   MRN: [id number] Left femoral neck fracture  Full note to follow NPO  Surgery pending left THR this pm.  I have reviewed with her grand daughter, Joellen Jersey, this plan

## 2017-02-14 DIAGNOSIS — D509 Iron deficiency anemia, unspecified: Secondary | ICD-10-CM

## 2017-02-14 LAB — CBC
HCT: 30 % — ABNORMAL LOW (ref 36.0–46.0)
Hemoglobin: 10.3 g/dL — ABNORMAL LOW (ref 12.0–15.0)
MCH: 33.8 pg (ref 26.0–34.0)
MCHC: 34.3 g/dL (ref 30.0–36.0)
MCV: 98.4 fL (ref 78.0–100.0)
PLATELETS: 152 10*3/uL (ref 150–400)
RBC: 3.05 MIL/uL — ABNORMAL LOW (ref 3.87–5.11)
RDW: 12.9 % (ref 11.5–15.5)
WBC: 8 10*3/uL (ref 4.0–10.5)

## 2017-02-14 LAB — BASIC METABOLIC PANEL
ANION GAP: 6 (ref 5–15)
BUN: 6 mg/dL (ref 6–20)
CALCIUM: 8.1 mg/dL — AB (ref 8.9–10.3)
CO2: 25 mmol/L (ref 22–32)
Chloride: 108 mmol/L (ref 101–111)
Creatinine, Ser: 0.47 mg/dL (ref 0.44–1.00)
Glucose, Bld: 165 mg/dL — ABNORMAL HIGH (ref 65–99)
POTASSIUM: 3.5 mmol/L (ref 3.5–5.1)
Sodium: 139 mmol/L (ref 135–145)

## 2017-02-14 MED ORDER — AMLODIPINE BESYLATE 5 MG PO TABS
5.0000 mg | ORAL_TABLET | Freq: Every day | ORAL | Status: DC
  Administered 2017-02-14: 21:00:00 5 mg via ORAL
  Filled 2017-02-14: qty 1

## 2017-02-14 MED ORDER — IBUPROFEN 200 MG PO TABS: 200.0000 mg | ORAL_TABLET | Freq: Four times a day (QID) | ORAL | Status: DC | PRN

## 2017-02-14 MED ORDER — TRAMADOL HCL 50 MG PO TABS
50.0000 mg | ORAL_TABLET | ORAL | Status: DC | PRN
  Administered 2017-02-14 – 2017-02-15 (×5): 50 mg via ORAL
  Filled 2017-02-14 (×5): qty 1

## 2017-02-14 MED ORDER — DIPHENHYDRAMINE HCL 25 MG PO CAPS
25.0000 mg | ORAL_CAPSULE | ORAL | Status: DC | PRN
  Administered 2017-02-14: 25 mg via ORAL
  Filled 2017-02-14: qty 1

## 2017-02-14 NOTE — Progress Notes (Signed)
Foley cath removed at approximately [id number]. Patient tolerated procedure.

## 2017-02-14 NOTE — Progress Notes (Signed)
PROGRESS NOTE    Kara Campos  PPJ:[id number] DOB: November 11, 1936 DOA: 02/12/2017 PCP: [name], MD     Brief Narrative:  This is a 82 year old female who is physically fairly active, no significant physical impairment in her activities of daily leaving which include climbing steps. On the physical examination she is hemodynamically stable, blood pressure 186/76, temperature 97.6, heart rate 84, respiratoryrate 16, oxygen saturation 100%. Her mucosa is moist, her lungs are clear to auscultation, heart S1-S2 present rhythmic, lower extremities no edema. Sodium 140, potassium 3.3, chloride 102, glucose 120, BUN 15, creatinine 0.50, hemoglobin 15, hematocrit 44. Chest x-ray personally review showing no infiltrates, effusions or stents pneumothorax. Left hip x-ray showing comminuted impacted left femoral neck fracture. Patient admitted with the working diagnosis of: Left hip pain due to comminuted impacted left femoral neck fracture.   Assessment & Plan:   Active Problems:   Hip fracture ([place])   1. Left hip fracture. SP left hip replacement. Continue pain control with  morphine frequency to q 2 hours for severe pain, for moderate pain ibuprofen and tramadol. DVT prophylaxis and physical therapy evaluation.   2. HTN.Blood pressure systolic 580 to 998 will continue on losartan and amlodipine and as needed hydralazine. Hydration with isotonic saline with dextrose.    3. Fibromyalgia. Continue nortriptyline. Out of bed as tolerated.   4. Iron deficiency. Continue iron supplements.   DVT prophylaxis: enoxaparin  Code Status: full  Family Communication: I spoke with patient's family at the bedside and all questions were addressed.  Disposition Plan: home   Consultants:   Orthopedics   Procedures:  02/13/17 Left total hip replacement through an anterior approach   utilizing DePuy THR system, component size 77mm pinnacle cup, a size 32+4 neutral   Altrex liner, a size 2 Hi Tri Lock  stem with a 32+9 delta ceramic   ball.    Antimicrobials  Subjective: Patient with pain at her feet and left hip, improved with pain medications, worse with movement. No nausea or vomiting.   Objective: Vitals:   02/13/17 2134 02/13/17 2235 02/14/17 0230 02/14/17 0641  BP: (!) 148/72 (!) 155/70 (!) 152/70 (!) 143/65  Pulse: 78 82 79 87  Resp: 16 16 16 16   Temp: 97.9 F (36.6 C) 98.6 F (37 C) 98.3 F (36.8 C) 98.1 F (36.7 C)  TempSrc: Oral Oral Oral Oral  SpO2: 99% 97% 98% 99%    Intake/Output Summary (Last 24 hours) at 02/14/17 0941 Last data filed at 02/14/17 3382  Gross per 24 hour  Intake           5102.7 ml  Output             2420 ml  Net           2682.7 ml   There were no vitals filed for this visit.  Examination:  General exam: deconditioned E ENT: mild pallor, oral mucosa dry.  Respiratory system: Clear to auscultation. Respiratory effort normal. No wheezing, rales or rhonchi.  Cardiovascular system: S1 & S2 heard, RRR. No JVD, murmurs, rubs, gallops or clicks. No pedal edema. Gastrointestinal system: Abdomen is nondistended, soft and nontender. No organomegaly or masses felt. Normal bowel sounds heard. Central nervous system: Alert and oriented. No focal neurological deficits. Extremities: Symmetric 5 x 5 power. Skin: No rashes, lesions or ulcers     Data Reviewed: I have personally reviewed following labs and imaging studies  CBC:  Recent Labs Lab 02/12/17 1723 02/12/17 1742 02/13/17 0456  02/14/17 0354  WBC 10.9*  --  8.8 8.0  NEUTROABS 8.1*  --   --   --   HGB 14.2 15.0 12.6 10.3*  HCT 42.1 44.0 36.5 30.0*  MCV 97.7  --  98.4 98.4  PLT 240  --  202 160   Basic Metabolic Panel:  Recent Labs Lab 02/12/17 1723 02/12/17 1742 02/13/17 0456 02/14/17 0354  NA 138 140 137 139  K 3.2* 3.3* 3.4* 3.5  CL 103 102 102 108  CO2 22  --  25 25  GLUCOSE 116* 120* 152* 165*  BUN 16 15 10 6   CREATININE 0.58 0.50 0.56 0.47  CALCIUM 9.4  --   8.7* 8.1*   GFR: CrCl cannot be calculated (Unknown ideal weight.). Liver Function Tests:  Recent Labs Lab 02/13/17 0456  AST 24  ALT 17  ALKPHOS 34*  BILITOT 1.0  PROT 6.6  ALBUMIN 3.6   No results for input(s): LIPASE, AMYLASE in the last 168 hours. No results for input(s): AMMONIA in the last 168 hours. Coagulation Profile:  Recent Labs Lab 02/12/17 1723  INR 1.00   Cardiac Enzymes: No results for input(s): CKTOTAL, CKMB, CKMBINDEX, TROPONINI in the last 168 hours. BNP (last 3 results) No results for input(s): PROBNP in the last 8760 hours. HbA1C: No results for input(s): HGBA1C in the last 72 hours. CBG:  Recent Labs Lab 02/13/17 1715  GLUCAP 149*   Lipid Profile: No results for input(s): CHOL, HDL, LDLCALC, TRIG, CHOLHDL, LDLDIRECT in the last 72 hours. Thyroid Function Tests: No results for input(s): TSH, T4TOTAL, FREET4, T3FREE, THYROIDAB in the last 72 hours. Anemia Panel: No results for input(s): VITAMINB12, FOLATE, FERRITIN, TIBC, IRON, RETICCTPCT in the last 72 hours. Sepsis Labs: No results for input(s): PROCALCITON, LATICACIDVEN in the last 168 hours.  Recent Results (from the past 240 hour(s))  MRSA PCR Screening     Status: None   Collection Time: 02/13/17 11:25 AM  Result Value Ref Range Status   MRSA by PCR NEGATIVE NEGATIVE Final    Comment:        The GeneXpert MRSA Assay (FDA approved for NASAL specimens only), is one component of a comprehensive MRSA colonization surveillance program. It is not intended to diagnose MRSA infection nor to guide or monitor treatment for MRSA infections.          Radiology Studies: Dg Chest 2 View  Result Date: 02/12/2017 CLINICAL DATA:  Pain following fall.  Hypertension. EXAM: CHEST  2 VIEW COMPARISON:  May 08, 2015 FINDINGS: There is no edema or consolidation. Heart size and pulmonary vascularity are normal. No adenopathy. Focal calcification is noted in the left breast. There is  atherosclerotic calcification in the aorta. No pneumothorax. There is degenerative change in mid thoracic spine. No acute fracture evident. IMPRESSION: No edema or consolidation. Aortic atherosclerosis. No pneumothorax. Calcification noted in left breast. Electronically Signed   By: Lowella Grip III M.D.   On: 02/12/2017 16:51   Dg C-arm 1-60 Min-no Report  Result Date: 02/13/2017 Fluoroscopy was utilized by the requesting physician.  No radiographic interpretation.   Dg Hip Port Unilat With Pelvis 1v Left  Result Date: 02/13/2017 CLINICAL DATA:  Status post left hip replacement. EXAM: DG HIP (WITH OR WITHOUT PELVIS) 1V PORT LEFT COMPARISON:  02/12/2017. FINDINGS: Interval left total hip prosthesis in satisfactory position and alignment. No fracture or dislocation seen. IMPRESSION: Satisfactory postoperative appearance of a left total hip prosthesis. Electronically Signed   By: Percell Locus.D.  On: 02/13/2017 17:58   Dg Hip Unilat W Or Wo Pelvis 2-3 Views Left  Result Date: 02/12/2017 CLINICAL DATA:  Fall today with left hip pain. EXAM: DG HIP (WITH OR WITHOUT PELVIS) 2-3V LEFT COMPARISON:  None. FINDINGS: Comminuted impacted left femoral neck fracture with mild apex lateral angulation and 15 mm superior displacement of the dominant distal fracture fragment. No dislocation of the left femoral head at the left hip joint. No additional fracture. No suspicious focal osseous lesion. No radiopaque foreign body. IMPRESSION: Comminuted impacted left femoral neck fracture as detailed. Electronically Signed   By: Ilona Sorrel M.D.   On: 02/12/2017 16:52        Scheduled Meds: . amLODipine  5 mg Oral Daily  . aspirin EC  325 mg Oral Q breakfast  . calcium-vitamin D  1 tablet Oral Q breakfast  . chlorhexidine  60 mL Topical Once  . cholecalciferol  1,000 Units Oral Daily  . dexamethasone  8 mg Intravenous Once  . docusate sodium  100 mg Oral BID  . ferrous sulfate  325 mg Oral BID WC  .  losartan  25 mg Oral Daily  . Magnesium Citrate  100 mg Oral QHS  . nortriptyline  10 mg Oral QHS  . omega-3 acid ethyl esters  2 g Oral Daily  . povidone-iodine  2 application Topical Once  . timolol  1 drop Both Eyes Daily   Continuous Infusions:   LOS: 2 days      Mauricio Gerome Apley, MD [facility] Pager [phone]  If 7PM-7AM, please contact night-coverage [email or web address] Password [facility] 02/14/2017, 9:41 AM

## 2017-02-14 NOTE — Evaluation (Signed)
Physical Therapy Evaluation Patient Details Name: Kara Campos MRN: [id number] DOB: Apr 20, 1936 Today's Date: 02/14/2017   History of Present Illness  Pt s/p L hip fx with THR repair and history of DM and osteoporosis  Clinical Impression  Pt s/p L THR and presents with decreased L LE strength/ROM and post op pain limiting functional mobility.  Pt should progress to dc home with family assist and HHPT follow up.    Follow Up Recommendations Home health PT    Equipment Recommendations  Rolling walker with 5" wheels (Pt is 5'1")    Recommendations for Other Services OT consult     Precautions / Restrictions Precautions Precautions: Fall Restrictions Weight Bearing Restrictions: No LUE Weight Bearing: Weight bearing as tolerated      Mobility  Bed Mobility Overal bed mobility: Needs Assistance Bed Mobility: Supine to Sit     Supine to sit: Min guard     General bed mobility comments: min cues for sequence  Transfers Overall transfer level: Needs assistance Equipment used: Rolling walker (2 wheeled) Transfers: Sit to/from Stand Sit to Stand: Min assist         General transfer comment: cues for LE management and use of UEs to self assist  Ambulation/Gait Ambulation/Gait assistance: Min assist Ambulation Distance (Feet): 110 Feet Assistive device: Rolling walker (2 wheeled) Gait Pattern/deviations: Step-to pattern;Decreased step length - right;Decreased step length - left;Shuffle;Trunk flexed Gait velocity: decr Gait velocity interpretation: Below normal speed for age/gender General Gait Details: cues for posture, position from RW and initial sequence  Stairs            Wheelchair Mobility    Modified Rankin (Stroke Patients Only)       Balance                                             Pertinent Vitals/Pain Pain Assessment: 0-10 Pain Score: 2  Pain Location: L hip Pain Descriptors / Indicators: Aching;Sore Pain  Intervention(s): Limited activity within patient's tolerance;Monitored during session;Premedicated before session;Ice applied    Home Living Family/patient expects to be discharged to:: Private residence   Available Help at Discharge: Family Type of Home: House Home Access: Stairs to enter Entrance Stairs-Rails: Right Entrance Stairs-Number of Steps: 5 Home Layout: One level Home Equipment: None      Prior Function Level of Independence: Independent         Comments: Very active     Hand Dominance        Extremity/Trunk Assessment   Upper Extremity Assessment Upper Extremity Assessment: Overall WFL for tasks assessed    Lower Extremity Assessment Lower Extremity Assessment: LLE deficits/detail LLE Deficits / Details: Strength at hip 2+/5 with AAROM at hip to 80 flex and 15 abd       Communication   Communication: No difficulties  Cognition Arousal/Alertness: Awake/alert Behavior During Therapy: WFL for tasks assessed/performed Overall Cognitive Status: Within Functional Limits for tasks assessed                      General Comments      Exercises Total Joint Exercises Ankle Circles/Pumps: AROM;Both;15 reps;Supine Quad Sets: AROM;Both;10 reps;Supine Heel Slides: AAROM;Left;20 reps;Supine Hip ABduction/ADduction: AAROM;Left;15 reps;Supine   Assessment/Plan    PT Assessment Patient needs continued PT services  PT Problem List Decreased strength;Decreased range of motion;Decreased activity tolerance;Decreased mobility;Decreased knowledge of use  of DME;Pain       PT Treatment Interventions DME instruction;Gait training;Stair training;Functional mobility training;Therapeutic activities;Therapeutic exercise;Patient/family education    PT Goals (Current goals can be found in the Care Plan section)  Acute Rehab PT Goals Patient Stated Goal: Regain IND ASAP PT Goal Formulation: With patient Time For Goal Achievement: 02/19/17 Potential to Achieve  Goals: Good    Frequency 7X/week   Barriers to discharge        Co-evaluation               End of Session Equipment Utilized During Treatment: Gait belt Activity Tolerance: Patient tolerated treatment well Patient left: in chair;with call bell/phone within reach;with chair alarm set;with family/visitor present Nurse Communication: Mobility status PT Visit Diagnosis: Unsteadiness on feet (R26.81)         Time: 3664-4034 PT Time Calculation (min) (ACUTE ONLY): 31 min   Charges:   PT Evaluation $PT Eval Low Complexity: 1 Procedure PT Treatments $Therapeutic Exercise: 8-22 mins   PT G Codes:         Gale Klar 02/14/2017, 10:46 AM

## 2017-02-14 NOTE — Evaluation (Signed)
Occupational Therapy Evaluation Patient Details Name: Kara Campos MRN: [id number] DOB: [birth year] Today's Date: 02/14/2017    History of Present Illness Pt s/p L hip fx with THR repair and history of DM and osteoporosis   Clinical Impression   Pt was active and independent prior to admission. She presents with post operative pain and decreased standing balance. She requires min assist for ADL. Educated pt in availability of AE, but pt prefers to rely on her husband. Instructed in tub transfer and options of using shower seat vs 3 in 1. Educated in multiple uses of 3 in 1. Pt with many questions, but session limited by arrival of visitors. Will follow acutely. Do not anticipate pt will need post acute OT.    Follow Up Recommendations  No OT follow up    Equipment Recommendations  3 in 1 bedside commode    Recommendations for Other Services       Precautions / Restrictions Precautions Precautions: Fall Restrictions Weight Bearing Restrictions: No LUE Weight Bearing: Weight bearing as tolerated      Mobility Bed Mobility Pt receiving in chair.  Transfers Overall transfer level: Needs assistance Equipment used: Rolling walker (2 wheeled) Transfers: Sit to/from Stand Sit to Stand: Min assist         General transfer comment: cues for hand placement from 3 in 1 and chair, steadying assist    Balance                                            ADL Overall ADL's : Needs assistance/impaired Eating/Feeding: Independent;Sitting   Grooming: Wash/dry hands;Standing;Min guard   Upper Body Bathing: Set up;Sitting   Lower Body Bathing: Minimal assistance;Sit to/from stand Lower Body Bathing Details (indicate cue type and reason): recommended long handled bath sponge Upper Body Dressing : Set up;Sitting   Lower Body Dressing: Minimal assistance;Sit to/from stand Lower Body Dressing Details (indicate cue type and reason): pt educated in availability of  AE, prefers to rely on her husband Toilet Transfer: Ambulation;RW;BSC;Minimal assistance Toilet Transfer Details (indicate cue type and reason): BSC over toilet Toileting- Clothing Manipulation and Hygiene: Minimal assistance     Tub/Shower Transfer Details (indicate cue type and reason): educated in various options for seated showering and use of 3 in 1 vs shower seat, also discussed washing hair over sink and sponge bathing Functional mobility during ADLs: Min guard;Rolling walker;Cueing for safety General ADL Comments: Educated in uses of 3 in 1, compensatory strategies for LB dressing, transporting items safely with walker and safe footwear.     Vision Patient Visual Report: No change from baseline       Perception     Praxis      Pertinent Vitals/Pain Pain Assessment: 0-10 Pain Score: 2  Pain Location: L hip Pain Descriptors / Indicators: Aching;Sore Pain Intervention(s): Ice applied;Patient requesting pain meds-RN notified;Monitored during session;Premedicated before session     Hand Dominance     Extremity/Trunk Assessment Upper Extremity Assessment Upper Extremity Assessment: Overall WFL for tasks assessed   Lower Extremity Assessment Lower Extremity Assessment: Defer to PT evaluation LLE Deficits / Details: Strength at hip 2+/5 with AAROM at hip to 80 flex and 15 abd       Communication Communication Communication: No difficulties   Cognition Arousal/Alertness: Awake/alert Behavior During Therapy: WFL for tasks assessed/performed Overall Cognitive Status: Within Functional Limits for tasks assessed  General Comments       Exercises      Shoulder Instructions      Home Living Family/patient expects to be discharged to:: Private residence Living Arrangements: Spouse/significant other Available Help at Discharge: Family Type of Home: House Home Access: Stairs to enter Technical brewer of Steps: 5 Entrance  Stairs-Rails: Right Home Layout: One level     Bathroom Shower/Tub: Teacher, early years/pre: Handicapped height     Home Equipment: None          Prior Functioning/Environment Level of Independence: Independent        Comments: Very active        OT Problem List: Decreased strength;Impaired balance (sitting and/or standing);Decreased knowledge of use of DME or AE;Increased edema      OT Treatment/Interventions: Self-care/ADL training;DME and/or AE instruction;Patient/family education    OT Goals(Current goals can be found in the care plan section) Acute Rehab OT Goals Patient Stated Goal: Regain IND ASAP OT Goal Formulation: With patient Time For Goal Achievement: 02/21/17 Potential to Achieve Goals: Good  OT Frequency: Min 2X/week   Barriers to D/C:            Co-evaluation              End of Session Equipment Utilized During Treatment: Rolling walker;Gait belt  Activity Tolerance: Patient tolerated treatment well Patient left: in chair;with call bell/phone within reach;with chair alarm set;with family/visitor present  OT Visit Diagnosis: Unsteadiness on feet (R26.81);History of falling (Z91.81);Pain Pain - Right/Left: Left Pain - part of body: Hip                ADL either performed or assessed with clinical judgement  Time: 7711-6579 OT Time Calculation (min): 21 min Charges:  OT General Charges $OT Visit: 1 Procedure OT Evaluation $OT Eval Low Complexity: 1 Procedure G-Codes:    Malka So 02/14/2017, 12:02 PM  [phone]

## 2017-02-14 NOTE — Progress Notes (Signed)
Subjective: 1 Day Post-Op Procedure(s) (LRB): TOTAL HIP ARTHROPLASTY ANTERIOR APPROACH (Left) Patient reports pain as well controlled.  Reports a good night. Tolerating  PO's. Denies SOB, CP, or calf pain.  Objective: Vital signs in last 24 hours: Temp:  [97.4 F (36.3 C)-99 F (37.2 C)] 98.1 F (36.7 C) (03/18 0641) Pulse Rate:  [75-92] 87 (03/18 0641) Resp:  [12-21] 16 (03/18 0641) BP: (143-183)/(54-93) 143/65 (03/18 0641) SpO2:  [91 %-100 %] 99 % (03/18 0641)  Intake/Output from previous day: 03/17 0701 - 03/18 0700 In: 5102.7 [P.O.:840; I.V.:4257.9; IV Piggyback:4.8] Out: 2420 [Urine:2120; Blood:300] Intake/Output this shift: No intake/output data recorded.   Recent Labs  02/12/17 1723 02/12/17 1742 02/13/17 0456 02/14/17 0354  HGB 14.2 15.0 12.6 10.3*    Recent Labs  02/13/17 0456 02/14/17 0354  WBC 8.8 8.0  RBC 3.71* 3.05*  HCT 36.5 30.0*  PLT 202 152    Recent Labs  02/13/17 0456 02/14/17 0354  NA 137 139  K 3.4* 3.5  CL 102 108  CO2 25 25  BUN 10 6  CREATININE 0.56 0.47  GLUCOSE 152* 165*  CALCIUM 8.7* 8.1*    Recent Labs  02/12/17 1723  INR 1.00    Alert and oriented x3. RRR, Lungs clear, BS x4. Left Calf soft and non tender. L hip dressing C/D/I. No DVT signs. No signs of infection or compartment syndrome. LLE grossly neurovascularly intact.   Assessment/Plan: 1 Day Post-Op Procedure(s) (LRB): TOTAL HIP ARTHROPLASTY ANTERIOR APPROACH (Left) Up with PT Plan to D/c home tomorrow Pain management Continue current care  STILWELL, BRYSON L 02/14/2017, 9:28 AM

## 2017-02-14 NOTE — Progress Notes (Signed)
Physical Therapy Treatment Patient Details Name: Kara Campos MRN: [id number] DOB: [birth year] Today's Date: 02/14/2017    History of Present Illness Pt s/p L hip fx with THR repair and history of DM and osteoporosis    PT Comments    Pt continues very motivated but ltd this pm by increased pain and fatigue.   Follow Up Recommendations  Home health PT     Equipment Recommendations  Rolling walker with 5" wheels    Recommendations for Other Services OT consult     Precautions / Restrictions Precautions Precautions: Fall Restrictions Weight Bearing Restrictions: No LUE Weight Bearing: Weight bearing as tolerated    Mobility  Bed Mobility Overal bed mobility: Needs Assistance Bed Mobility: Supine to Sit;Sit to Supine     Supine to sit: Min guard Sit to supine: Min assist   General bed mobility comments: cues for sequence and use of R LE to self assist  Transfers Overall transfer level: Needs assistance Equipment used: Rolling walker (2 wheeled) Transfers: Sit to/from Stand Sit to Stand: Min assist         General transfer comment: cues for LE management and use of UEs to self assist  Ambulation/Gait Ambulation/Gait assistance: Min assist Ambulation Distance (Feet): 74 Feet Assistive device: Rolling walker (2 wheeled) Gait Pattern/deviations: Step-to pattern;Decreased step length - right;Decreased step length - left;Shuffle;Trunk flexed Gait velocity: decr Gait velocity interpretation: Below normal speed for age/gender General Gait Details: cues for posture, position from RW and initial sequence.  Pt tolerating decreased WB on L vs am session   Stairs            Wheelchair Mobility    Modified Rankin (Stroke Patients Only)       Balance                                    Cognition Arousal/Alertness: Awake/alert Behavior During Therapy: WFL for tasks assessed/performed Overall Cognitive Status: Within Functional Limits for  tasks assessed                      Exercises      General Comments        Pertinent Vitals/Pain Pain Assessment: 0-10 Pain Score: 6  Pain Location: L hip and knee Pain Descriptors / Indicators: Aching;Sore Pain Intervention(s): Limited activity within patient's tolerance;Monitored during session;Premedicated before session;Ice applied    Home Living                      Prior Function            PT Goals (current goals can now be found in the care plan section) Acute Rehab PT Goals Patient Stated Goal: Regain IND ASAP PT Goal Formulation: With patient Time For Goal Achievement: 02/19/17 Potential to Achieve Goals: Good    Frequency    7X/week      PT Plan Current plan remains appropriate    Co-evaluation             End of Session Equipment Utilized During Treatment: Gait belt Activity Tolerance: Patient tolerated treatment well;Patient limited by fatigue Patient left: in bed;with call bell/phone within reach;with family/visitor present Nurse Communication: Mobility status PT Visit Diagnosis: Unsteadiness on feet (R26.81)     Time: 0932-3557 PT Time Calculation (min) (ACUTE ONLY): 23 min  Charges:  $Gait Training: 23-37 mins  G Codes:       Kara Campos 2017/02/15, 4:23 PM

## 2017-02-15 LAB — CBC
HCT: 26.7 % — ABNORMAL LOW (ref 36.0–46.0)
HEMOGLOBIN: 9.1 g/dL — AB (ref 12.0–15.0)
MCH: 33.6 pg (ref 26.0–34.0)
MCHC: 34.1 g/dL (ref 30.0–36.0)
MCV: 98.5 fL (ref 78.0–100.0)
Platelets: 178 10*3/uL (ref 150–400)
RBC: 2.71 MIL/uL — AB (ref 3.87–5.11)
RDW: 13.1 % (ref 11.5–15.5)
WBC: 8.9 10*3/uL (ref 4.0–10.5)

## 2017-02-15 LAB — BASIC METABOLIC PANEL
Anion gap: 6 (ref 5–15)
BUN: 13 mg/dL (ref 6–20)
CHLORIDE: 107 mmol/L (ref 101–111)
CO2: 28 mmol/L (ref 22–32)
Calcium: 8.5 mg/dL — ABNORMAL LOW (ref 8.9–10.3)
Creatinine, Ser: 0.53 mg/dL (ref 0.44–1.00)
GFR calc non Af Amer: >60 mL/min (ref >60)
Glucose, Bld: 111 mg/dL — ABNORMAL HIGH (ref 65–99)
POTASSIUM: 3.2 mmol/L — AB (ref 3.5–5.1)
SODIUM: 141 mmol/L (ref 135–145)

## 2017-02-15 MED ORDER — POTASSIUM CHLORIDE CRYS ER 20 MEQ PO TBCR
40.0000 meq | EXTENDED_RELEASE_TABLET | Freq: Once | ORAL | Status: AC
  Administered 2017-02-15: 40 meq via ORAL
  Filled 2017-02-15: qty 2

## 2017-02-15 MED ORDER — POLYETHYLENE GLYCOL 3350 17 G PO PACK
17.0000 g | PACK | Freq: Every day | ORAL | 0 refills | Status: DC | PRN
Start: 2017-02-15 — End: 2017-08-11

## 2017-02-15 MED ORDER — TRAMADOL HCL 50 MG PO TABS: 50.0000 mg | ORAL_TABLET | Freq: Four times a day (QID) | ORAL | 0 refills | Status: DC | PRN

## 2017-02-15 MED ORDER — PANTOPRAZOLE SODIUM 40 MG PO TBEC: 40.0000 mg | DELAYED_RELEASE_TABLET | Freq: Every day | ORAL | 0 refills | Status: DC

## 2017-02-15 MED ORDER — ASPIRIN 81 MG PO CHEW: 81.0000 mg | CHEWABLE_TABLET | Freq: Two times a day (BID) | ORAL | 0 refills | Status: DC

## 2017-02-15 MED ORDER — POTASSIUM CHLORIDE 20 MEQ PO PACK: 40.0000 meq | PACK | Freq: Once | ORAL | Status: DC

## 2017-02-15 MED ORDER — FERROUS SULFATE 325 (65 FE) MG PO TABS: 325.0000 mg | ORAL_TABLET | Freq: Two times a day (BID) | ORAL | 3 refills | Status: DC

## 2017-02-15 MED ORDER — METHOCARBAMOL 500 MG PO TABS: 500.0000 mg | ORAL_TABLET | Freq: Four times a day (QID) | ORAL | 0 refills | Status: DC | PRN

## 2017-02-15 MED ORDER — DOCUSATE SODIUM 100 MG PO CAPS: 100.0000 mg | ORAL_CAPSULE | Freq: Two times a day (BID) | ORAL | 0 refills | Status: DC

## 2017-02-15 MED ORDER — ACETAMINOPHEN 325 MG PO TABS: 650.0000 mg | ORAL_TABLET | Freq: Four times a day (QID) | ORAL | Status: DC | PRN

## 2017-02-15 NOTE — Progress Notes (Signed)
Physical Therapy Treatment Patient Details Name: Kara Campos MRN: [id number] DOB: 09-08-1936 Today's Date: 02/15/2017    History of Present Illness Pt s/p L hip fx with THR repair and history of DM and osteoporosis    PT Comments    POD # 3 am session Assisted with stair training 4 steps one rail/one crutch and up one step forward with walker.  Handout given and one crutch issued.  Returned to room and assisted to bathroom. Then educated on HEP with handout given.    Follow Up Recommendations  Home health PT     Equipment Recommendations  Rolling walker with 5" wheels    Recommendations for Other Services       Precautions / Restrictions Precautions Precautions: Fall Restrictions Weight Bearing Restrictions: No LUE Weight Bearing: Weight bearing as tolerated    Mobility  Bed Mobility               General bed mobility comments: OOB in recliner  Transfers Overall transfer level: Needs assistance Equipment used: Rolling walker (2 wheeled) Transfers: Sit to/from Stand Sit to Stand: Supervision;Min guard         General transfer comment: <25% VC's on safety with turns and hand placement with stand to sit  Ambulation/Gait Ambulation/Gait assistance: Supervision;Min guard Ambulation Distance (Feet): 82 Feet Assistive device: Rolling walker (2 wheeled) Gait Pattern/deviations: Step-to pattern;Decreased step length - right;Decreased step length - left;Shuffle;Trunk flexed Gait velocity: decr   General Gait Details: <25% cues for posture, position from RW and initial sequence.    Stairs Stairs: Yes   Stair Management: One rail Left;Step to pattern;Forwards;With crutches;With walker Number of Stairs: 5 General stair comments: First up 4 with one rail and one crutch then up one step forward with walker.  All requiring 25% VC's/instruction on proper tech and safety.  Handout also given.  One crutch issued.   Wheelchair Mobility    Modified Rankin  (Stroke Patients Only)       Balance                                    Cognition Arousal/Alertness: Awake/alert Behavior During Therapy: WFL for tasks assessed/performed Overall Cognitive Status: Within Functional Limits for tasks assessed                      Exercises      General Comments        Pertinent Vitals/Pain Pain Assessment: 0-10 Pain Score: 5  Pain Location: L hip and knee Pain Descriptors / Indicators: Aching;Sore;Operative site guarding Pain Intervention(s): Monitored during session;Repositioned;Ice applied    Home Living                      Prior Function            PT Goals (current goals can now be found in the care plan section) Acute Rehab PT Goals Patient Stated Goal: Regain IND ASAP Progress towards PT goals: Progressing toward goals    Frequency    7X/week      PT Plan Current plan remains appropriate    Co-evaluation             End of Session Equipment Utilized During Treatment: Gait belt Activity Tolerance: Patient tolerated treatment well Patient left: in chair Nurse Communication:  (pt ready for D/C to home) PT Visit Diagnosis: Unsteadiness on feet (R26.81)  Time: 3220-2542 PT Time Calculation (min) (ACUTE ONLY): 24 min  Charges:  $Gait Training: 8-22 mins $Therapeutic Activity: 8-22 mins                    G Codes:       Rica Koyanagi  PTA [facility]  Acute  Rehab Pager      [phone]

## 2017-02-15 NOTE — Progress Notes (Signed)
     Subjective: 2 Days Post-Op Procedure(s) (LRB): TOTAL HIP ARTHROPLASTY ANTERIOR APPROACH (Left)   Patient reports pain as mild, pain controlled with tramadol.  No events throughout the night.  Nervous about leaving the hospital, but ready to get better. Discussed hypokalemia, she states that she usually take potassium at home.  Objective:   VITALS:   Vitals:   02/15/17 0528 02/15/17 0532  BP: (!) 117/51   Pulse: 81 75  Resp: 16 16  Temp: 98.9 F (37.2 C)     Dorsiflexion/Plantar flexion intact Incision: dressing C/D/I No cellulitis present Compartment soft  LABS  Recent Labs  02/13/17 0456 02/14/17 0354 02/15/17 0427  HGB 12.6 10.3* 9.1*  HCT 36.5 30.0* 26.7*  WBC 8.8 8.0 8.9  PLT 202 152 178     Recent Labs  02/13/17 0456 02/14/17 0354 02/15/17 0427  NA 137 139 141  K 3.4* 3.5 3.2*  BUN 10 6 13   CREATININE 0.56 0.47 0.53  GLUCOSE 152* 165* 111*     Assessment/Plan: 2 Days Post-Op Procedure(s) (LRB): TOTAL HIP ARTHROPLASTY ANTERIOR APPROACH (Left) Orthopaedically stable Up with therapy Discharge home  Ortho recommendations:  ASA 81mg  bid for 4 weeks for anticoagulation, unless other medically indicated.  Tramadol for pain management (Rx written).  Robaxin for muscle spasms (Rx written).  MiraLax and Colace for constipation  Iron 325 mg tid for 2-3 weeks   WBAT on the left leg.  Dressing to remain in place until follow in clinic in 2 weeks.  Dressing is waterproof and may shower with it in place.  Follow up in 2 weeks at [facility]. Follow up with OLIN,Shanette Tamargo D in 2 weeks.  Contact information:  [facility] [address], [address] [place] Kerri Asche   PAC  02/15/2017, 7:46 AM

## 2017-02-15 NOTE — Progress Notes (Signed)
Discharge planning, spoke with patient in hallway while working with PT. No preference for [facility] agency, contacted [facility] for referral. Needs RW, wants to wait on 3-n-1 until she gets home to decide if it is needed, contacted [facility] to deliver RW to room. [phone]

## 2017-02-15 NOTE — Discharge Instructions (Signed)

## 2017-02-15 NOTE — Discharge Summary (Signed)
Physician Discharge Summary  Kara Campos:[id number] DOB: May 01, 1936 DOA: 02/12/2017  PCP: [name], MD  Admit date: 02/12/2017 Discharge date: 02/15/2017  Admitted From: Home  Disposition:  Home  Recommendations for Outpatient Follow-up:  1. Follow up with PCP 1 weeks 2. Patient will have home physical therapy 3. Rolling walker 4. Aspirin for dvt prophylaxis per orthopedic service 5. Patient will be placed on protonix  Home Health: Yes  Equipment/Devices: rolling walker  Discharge Condition: home  CODE STATUS: Full  Diet recommendation: Heart Healthy  Brief/Interim Summary: This is a 81 year old female who is physically  fairly active, no significant physical impairment in her activities of daily leaving which include climbing steps. On the physical examination she is hemodynamically stable, blood pressure 186/76, temperature 97.6, heart rate 84, respiratory rate 16, oxygen saturation 100%. Her mucosa was moist, her lungs were clear to auscultation, heart S1-S2 present rhythmic, lower extremities no edema. Sodium 140, potassium 3.3, chloride 102, glucose 120, BUN 15, creatinine 0.50, hemoglobin 15, hematocrit 44. Chest x-ray personally review showing no infiltrates, effusions or stents pneumothorax. Left hip x-ray showing comminuted impacted left femoral neck fracture. Working diagnosis. Left hip pain due to comminuted impacted left femoral neck fracture.  1. Comminuted impacted left femoral neck fracture. The patient was admitted to the medical floor, she received IV morphine and po ibuprofen for pain control, enoxaparin for DVT prophylaxis. Patient was seen by orthopedics, and underwent a left total hip replacement through an anterior approach. She received physical therapy with recommendations for home health with home physical therapy, rolling walker. Patient will be discharged on tramadol for pain controlled and Robaxin for muscle relaxant. DVT prophylaxis with aspirin.   2.  Hypertension. Patient was placed back on her antihypertensive agents including losartan and amlodipine. Discharge blood pressure 938 systolic.  3. Fibromyalgia. Continue nortriptyline.  4. Iron deficiency anemia. Continue iron supplements, check iron panel as an outpatient.  Discharge Diagnoses:  Active Problems:   Hip fracture [facility])    Discharge Instructions   Allergies as of 02/15/2017      Reactions   Codeine Nausea Only   Sulfa Antibiotics Nausea And Vomiting      Medication List    TAKE these medications   acetaminophen 325 MG tablet Commonly known as:  TYLENOL Take 2 tablets (650 mg total) by mouth every 6 (six) hours as needed for mild pain (or Fever >/= 101).   aspirin 81 MG chewable tablet Chew 1 tablet (81 mg total) by mouth 2 (two) times daily. Take for 4 weeks.   BETIMOL 0.5 % ophthalmic solution Generic drug:  timolol Place 1 drop into both eyes daily.   CALTRATE 600+D 600-400 MG-UNIT tablet Generic drug:  Calcium Carbonate-Vitamin D Take 1 tablet by mouth daily.   co-enzyme Q-10 30 MG capsule Take 50 mg by mouth once a week.   docusate sodium 100 MG capsule Commonly known as:  COLACE Take 1 capsule (100 mg total) by mouth 2 (two) times daily.   ferrous sulfate 325 (65 FE) MG tablet Take 1 tablet (325 mg total) by mouth 2 (two) times daily with a meal.   fish oil-omega-3 fatty acids 1000 MG capsule Take 2 g by mouth daily.   losartan 25 MG tablet Commonly known as:  COZAAR Take 1 tablet by mouth daily.   Magnesium Citrate 100 MG Tabs Take 100 mg by mouth at bedtime.   methocarbamol 500 MG tablet Commonly known as:  ROBAXIN Take 1 tablet (500 mg total)  by mouth every 6 (six) hours as needed for muscle spasms.   nortriptyline 10 MG capsule Commonly known as:  PAMELOR Take 10 mg by mouth at bedtime.   NORVASC 5 MG tablet Generic drug:  amLODipine Take 5 mg by mouth daily.   pantoprazole 40 MG tablet Commonly known as:  PROTONIX Take 1  tablet (40 mg total) by mouth daily.   polyethylene glycol packet Commonly known as:  MIRALAX / GLYCOLAX Take 17 g by mouth daily as needed for mild constipation.   traMADol 50 MG tablet Commonly known as:  ULTRAM Take 1-2 tablets (50-100 mg total) by mouth every 6 (six) hours as needed for moderate pain or severe pain. What changed:  how much to take  reasons to take this   VITAMIN D PO Take by mouth.            Durable Medical Equipment        Start     Ordered   02/15/17 0950  For home use only DME Gilford Rile  [facility])  Once    Question:  Patient needs a walker to treat with the following condition  Answer:  Hip fracture ([place])   02/15/17 0950     Follow-up Information    Mauri Pole, MD. Schedule an appointment as soon as possible for a visit in 2 week(s).   Specialty:  Orthopedic Surgery Contact information: [address] [place] [address] [phone]        [name], MD Follow up in 1 week(s).   Specialty:  Internal Medicine Contact information: [address] [place] [address] [phone]          Allergies  Allergen Reactions  . Codeine Nausea Only  . Sulfa Antibiotics Nausea And Vomiting    Consultations:  Orthopedics   Procedures/Studies: Dg Chest 2 View  Result Date: 02/12/2017 CLINICAL DATA:  Pain following fall.  Hypertension. EXAM: CHEST  2 VIEW COMPARISON:  May 08, 2015 FINDINGS: There is no edema or consolidation. Heart size and pulmonary vascularity are normal. No adenopathy. Focal calcification is noted in the left breast. There is atherosclerotic calcification in the aorta. No pneumothorax. There is degenerative change in mid thoracic spine. No acute fracture evident. IMPRESSION: No edema or consolidation. Aortic atherosclerosis. No pneumothorax. Calcification noted in left breast. Electronically Signed   By: Lowella Grip III M.D.   On: 02/12/2017 16:51   Dg C-arm 1-60 Min-no  Report  Result Date: 02/13/2017 Fluoroscopy was utilized by the requesting physician.  No radiographic interpretation.   Dg Hip Port Unilat With Pelvis 1v Left  Result Date: 02/13/2017 CLINICAL DATA:  Status post left hip replacement. EXAM: DG HIP (WITH OR WITHOUT PELVIS) 1V PORT LEFT COMPARISON:  02/12/2017. FINDINGS: Interval left total hip prosthesis in satisfactory position and alignment. No fracture or dislocation seen. IMPRESSION: Satisfactory postoperative appearance of a left total hip prosthesis. Electronically Signed   By: Claudie Revering M.D.   On: 02/13/2017 17:58   Dg Hip Unilat W Or Wo Pelvis 2-3 Views Left  Result Date: 02/12/2017 CLINICAL DATA:  Fall today with left hip pain. EXAM: DG HIP (WITH OR WITHOUT PELVIS) 2-3V LEFT COMPARISON:  None. FINDINGS: Comminuted impacted left femoral neck fracture with mild apex lateral angulation and 15 mm superior displacement of the dominant distal fracture fragment. No dislocation of the left femoral head at the left hip joint. No additional fracture. No suspicious focal osseous lesion. No radiopaque foreign body. IMPRESSION: Comminuted impacted  left femoral neck fracture as detailed. Electronically Signed   By: Ilona Sorrel M.D.   On: 02/12/2017 16:52        Subjective: Patient feeling better, no nausea or vomiting, out of bed to chair.   Discharge Exam: Vitals:   02/15/17 0532 02/15/17 0811  BP:  132/77  Pulse: 75 85  Resp: 16   Temp:     Vitals:   02/14/17 2127 02/15/17 0528 02/15/17 0532 02/15/17 0811  BP: (!) 127/41 (!) 117/51  132/77  Pulse: 77 81 75 85  Resp: 16 16 16    Temp: 99.2 F (37.3 C) 98.9 F (37.2 C)    TempSrc: Oral Oral    SpO2: 94% (!) 89% 93%     General: Pt is alert, awake, not in acute distress Cardiovascular: RRR, S1/S2 +, no rubs, no gallops Respiratory: CTA bilaterally, no wheezing, no rhonchi Abdominal: Soft, NT, ND, bowel sounds + Extremities: no edema, no cyanosis    The results of  significant diagnostics from this hospitalization (including imaging, microbiology, ancillary and laboratory) are listed below for reference.     Microbiology: Recent Results (from the past 240 hour(s))  MRSA PCR Screening     Status: None   Collection Time: 02/13/17 11:25 AM  Result Value Ref Range Status   MRSA by PCR NEGATIVE NEGATIVE Final    Comment:        The GeneXpert MRSA Assay (FDA approved for NASAL specimens only), is one component of a comprehensive MRSA colonization surveillance program. It is not intended to diagnose MRSA infection nor to guide or monitor treatment for MRSA infections.      Labs: BNP (last 3 results) No results for input(s): BNP in the last 8760 hours. Basic Metabolic Panel:  Recent Labs Lab 02/12/17 1723 02/12/17 1742 02/13/17 0456 02/14/17 0354 02/15/17 0427  NA 138 140 137 139 141  K 3.2* 3.3* 3.4* 3.5 3.2*  CL 103 102 102 108 107  CO2 22  --  25 25 28   GLUCOSE 116* 120* 152* 165* 111*  BUN 16 15 10 6 13   CREATININE 0.58 0.50 0.56 0.47 0.53  CALCIUM 9.4  --  8.7* 8.1* 8.5*   Liver Function Tests:  Recent Labs Lab 02/13/17 0456  AST 24  ALT 17  ALKPHOS 34*  BILITOT 1.0  PROT 6.6  ALBUMIN 3.6   No results for input(s): LIPASE, AMYLASE in the last 168 hours. No results for input(s): AMMONIA in the last 168 hours. CBC:  Recent Labs Lab 02/12/17 1723 02/12/17 1742 02/13/17 0456 02/14/17 0354 02/15/17 0427  WBC 10.9*  --  8.8 8.0 8.9  NEUTROABS 8.1*  --   --   --   --   HGB 14.2 15.0 12.6 10.3* 9.1*  HCT 42.1 44.0 36.5 30.0* 26.7*  MCV 97.7  --  98.4 98.4 98.5  PLT 240  --  202 152 178   Cardiac Enzymes: No results for input(s): CKTOTAL, CKMB, CKMBINDEX, TROPONINI in the last 168 hours. BNP: Invalid input(s): POCBNP CBG:  Recent Labs Lab 02/13/17 1715  GLUCAP 149*   D-Dimer No results for input(s): DDIMER in the last 72 hours. Hgb A1c No results for input(s): HGBA1C in the last 72 hours. Lipid  Profile No results for input(s): CHOL, HDL, LDLCALC, TRIG, CHOLHDL, LDLDIRECT in the last 72 hours. Thyroid function studies No results for input(s): TSH, T4TOTAL, T3FREE, THYROIDAB in the last 72 hours.  Invalid input(s): FREET3 Anemia work up No results for input(s): VITAMINB12,  FOLATE, FERRITIN, TIBC, IRON, RETICCTPCT in the last 72 hours. Urinalysis    Component Value Date/Time   COLORURINE YELLOW 10/09/2014 1204   APPEARANCEUR CLOUDY (A) 10/09/2014 1204   LABSPEC 1.018 10/09/2014 1204   PHURINE 5.0 10/09/2014 1204   GLUCOSEU NEG 10/09/2014 1204   HGBUR NEG 10/09/2014 1204   BILIRUBINUR NEG 10/09/2014 1204   KETONESUR NEG 10/09/2014 1204   PROTEINUR NEG 10/09/2014 1204   UROBILINOGEN 0.2 10/09/2014 1204   NITRITE NEG 10/09/2014 1204   LEUKOCYTESUR NEG 10/09/2014 1204   Sepsis Labs Invalid input(s): PROCALCITONIN,  WBC,  LACTICIDVEN Microbiology Recent Results (from the past 240 hour(s))  MRSA PCR Screening     Status: None   Collection Time: 02/13/17 11:25 AM  Result Value Ref Range Status   MRSA by PCR NEGATIVE NEGATIVE Final    Comment:        The GeneXpert MRSA Assay (FDA approved for NASAL specimens only), is one component of a comprehensive MRSA colonization surveillance program. It is not intended to diagnose MRSA infection nor to guide or monitor treatment for MRSA infections.      Time coordinating discharge: 45 minutes  SIGNED:   Tawni Millers, MD  [facility] 02/15/2017, 9:33 AM Pager   If 7PM-7AM, please contact night-coverage [email or web address] Password [email or web address]

## 2017-02-15 NOTE — Progress Notes (Signed)
Occupational Therapy Treatment Patient Details Name: Kara Campos MRN: [id number] DOB: 1936-01-31 Today's Date: 02/15/2017    History of present illness Pt s/p L hip fx with THR repair and history of DM and osteoporosis   OT comments  Pt feels husband will be able to provide A at home  Follow Up Recommendations  No OT follow up    Equipment Recommendations  3 in 1 bedside commode       Precautions / Restrictions Restrictions Weight Bearing Restrictions: No LUE Weight Bearing: Weight bearing as tolerated       Mobility Bed Mobility               General bed mobility comments: pt in chair  Transfers Overall transfer level: Needs assistance Equipment used: Rolling walker (2 wheeled) Transfers: Sit to/from Stand Sit to Stand: Min guard         General transfer comment: cues for LE management and use of UEs to self assist        ADL Overall ADL's : Needs assistance/impaired                     Lower Body Dressing: Minimal assistance;Sit to/from stand Lower Body Dressing Details (indicate cue type and reason): pt states husband will A Toilet Transfer: Ambulation;RW;BSC;Min Psychiatric nurse Details (indicate cue type and reason): BSC over toilet Toileting- Clothing Manipulation and Hygiene: Min guard       Functional mobility during ADLs: Min guard;Rolling walker;Cueing for safety General ADL Comments: pt states husband will A as needed.  OT reccomended 3 n 1 for home but pt not sure she needs. Pt states she has a towel bar she can pull up on - OT explained this was not a safe option.  Pt then states she could pull up on walker.  OT explained why this was not safe and why pt needed 3 n 1       Vision                            Cognition   Behavior During Therapy: WFL for tasks assessed/performed Overall Cognitive Status: Within Functional Limits for tasks assessed                                 Frequency  Min  2X/week        Progress Toward Goals  OT Goals(current goals can now be found in the care plan section)  Progress towards OT goals: Progressing toward goals  Acute Rehab OT Goals Patient Stated Goal: Regain IND ASAP  Plan Discharge plan remains appropriate       End of Session Equipment Utilized During Treatment: Rolling walker;Gait belt  OT Visit Diagnosis: Unsteadiness on feet (R26.81);History of falling (Z91.81);Pain Pain - Right/Left: Left Pain - part of body: Hip   Activity Tolerance Patient tolerated treatment well   Patient Left in chair;with call bell/phone within reach;with chair alarm set;with family/visitor present   Nurse Communication          Time: 1050-1109 OT Time Calculation (min): 19 min  Charges: OT General Charges $OT Visit: 1 Procedure OT Treatments $Self Care/Home Management : 8-22 mins  [place], [name]   Payton Mccallum D 02/15/2017, 12:10 PM

## 2017-02-16 DIAGNOSIS — Z96642 Presence of left artificial hip joint: Secondary | ICD-10-CM | POA: Diagnosis not present

## 2017-02-16 DIAGNOSIS — Z9181 History of falling: Secondary | ICD-10-CM | POA: Diagnosis not present

## 2017-02-16 DIAGNOSIS — I10 Essential (primary) hypertension: Secondary | ICD-10-CM | POA: Diagnosis not present

## 2017-02-16 DIAGNOSIS — M797 Fibromyalgia: Secondary | ICD-10-CM | POA: Diagnosis not present

## 2017-02-16 DIAGNOSIS — W0110XD Fall on same level from slipping, tripping and stumbling with subsequent striking against unspecified object, subsequent encounter: Secondary | ICD-10-CM | POA: Diagnosis not present

## 2017-02-16 DIAGNOSIS — S72002D Fracture of unspecified part of neck of left femur, subsequent encounter for closed fracture with routine healing: Secondary | ICD-10-CM | POA: Diagnosis not present

## 2017-02-17 ENCOUNTER — Encounter (HOSPITAL_COMMUNITY): Payer: Self-pay | Admitting: Orthopedic Surgery

## 2017-02-18 DIAGNOSIS — M797 Fibromyalgia: Secondary | ICD-10-CM | POA: Diagnosis not present

## 2017-02-18 DIAGNOSIS — W0110XD Fall on same level from slipping, tripping and stumbling with subsequent striking against unspecified object, subsequent encounter: Secondary | ICD-10-CM | POA: Diagnosis not present

## 2017-02-18 DIAGNOSIS — Z96642 Presence of left artificial hip joint: Secondary | ICD-10-CM | POA: Diagnosis not present

## 2017-02-18 DIAGNOSIS — I10 Essential (primary) hypertension: Secondary | ICD-10-CM | POA: Diagnosis not present

## 2017-02-18 DIAGNOSIS — Z9181 History of falling: Secondary | ICD-10-CM | POA: Diagnosis not present

## 2017-02-18 DIAGNOSIS — S72002D Fracture of unspecified part of neck of left femur, subsequent encounter for closed fracture with routine healing: Secondary | ICD-10-CM | POA: Diagnosis not present

## 2017-02-22 DIAGNOSIS — Z9181 History of falling: Secondary | ICD-10-CM | POA: Diagnosis not present

## 2017-02-22 DIAGNOSIS — Z96642 Presence of left artificial hip joint: Secondary | ICD-10-CM | POA: Diagnosis not present

## 2017-02-22 DIAGNOSIS — W0110XD Fall on same level from slipping, tripping and stumbling with subsequent striking against unspecified object, subsequent encounter: Secondary | ICD-10-CM | POA: Diagnosis not present

## 2017-02-22 DIAGNOSIS — S72002D Fracture of unspecified part of neck of left femur, subsequent encounter for closed fracture with routine healing: Secondary | ICD-10-CM | POA: Diagnosis not present

## 2017-02-22 DIAGNOSIS — M797 Fibromyalgia: Secondary | ICD-10-CM | POA: Diagnosis not present

## 2017-02-22 DIAGNOSIS — I10 Essential (primary) hypertension: Secondary | ICD-10-CM | POA: Diagnosis not present

## 2017-02-25 DIAGNOSIS — M797 Fibromyalgia: Secondary | ICD-10-CM | POA: Diagnosis not present

## 2017-02-25 DIAGNOSIS — Z9181 History of falling: Secondary | ICD-10-CM | POA: Diagnosis not present

## 2017-02-25 DIAGNOSIS — S72002D Fracture of unspecified part of neck of left femur, subsequent encounter for closed fracture with routine healing: Secondary | ICD-10-CM | POA: Diagnosis not present

## 2017-02-25 DIAGNOSIS — W0110XD Fall on same level from slipping, tripping and stumbling with subsequent striking against unspecified object, subsequent encounter: Secondary | ICD-10-CM | POA: Diagnosis not present

## 2017-02-25 DIAGNOSIS — Z96642 Presence of left artificial hip joint: Secondary | ICD-10-CM | POA: Diagnosis not present

## 2017-02-25 DIAGNOSIS — I10 Essential (primary) hypertension: Secondary | ICD-10-CM | POA: Diagnosis not present

## 2017-03-03 DIAGNOSIS — Z4789 Encounter for other orthopedic aftercare: Secondary | ICD-10-CM | POA: Diagnosis not present

## 2017-03-03 DIAGNOSIS — Z471 Aftercare following joint replacement surgery: Secondary | ICD-10-CM | POA: Diagnosis not present

## 2017-03-03 DIAGNOSIS — Z96642 Presence of left artificial hip joint: Secondary | ICD-10-CM | POA: Diagnosis not present

## 2017-03-04 DIAGNOSIS — M797 Fibromyalgia: Secondary | ICD-10-CM | POA: Diagnosis not present

## 2017-03-04 DIAGNOSIS — I10 Essential (primary) hypertension: Secondary | ICD-10-CM | POA: Diagnosis not present

## 2017-03-04 DIAGNOSIS — S72002D Fracture of unspecified part of neck of left femur, subsequent encounter for closed fracture with routine healing: Secondary | ICD-10-CM | POA: Diagnosis not present

## 2017-03-04 DIAGNOSIS — Z96642 Presence of left artificial hip joint: Secondary | ICD-10-CM | POA: Diagnosis not present

## 2017-03-04 DIAGNOSIS — Z9181 History of falling: Secondary | ICD-10-CM | POA: Diagnosis not present

## 2017-03-04 DIAGNOSIS — W0110XD Fall on same level from slipping, tripping and stumbling with subsequent striking against unspecified object, subsequent encounter: Secondary | ICD-10-CM | POA: Diagnosis not present

## 2017-04-01 DIAGNOSIS — Z96642 Presence of left artificial hip joint: Secondary | ICD-10-CM | POA: Diagnosis not present

## 2017-04-01 DIAGNOSIS — Z4789 Encounter for other orthopedic aftercare: Secondary | ICD-10-CM | POA: Diagnosis not present

## 2017-04-01 DIAGNOSIS — Z471 Aftercare following joint replacement surgery: Secondary | ICD-10-CM | POA: Diagnosis not present

## 2017-04-15 DIAGNOSIS — H524 Presbyopia: Secondary | ICD-10-CM | POA: Diagnosis not present

## 2017-04-15 DIAGNOSIS — H25813 Combined forms of age-related cataract, bilateral: Secondary | ICD-10-CM | POA: Diagnosis not present

## 2017-04-15 DIAGNOSIS — H401132 Primary open-angle glaucoma, bilateral, moderate stage: Secondary | ICD-10-CM | POA: Diagnosis not present

## 2017-04-27 ENCOUNTER — Encounter: Payer: Self-pay | Admitting: Gynecology

## 2017-04-27 ENCOUNTER — Ambulatory Visit (INDEPENDENT_AMBULATORY_CARE_PROVIDER_SITE_OTHER): Payer: Medicare Other | Admitting: Gynecology

## 2017-04-27 VITALS — BP 116/74 | Ht 61.0 in | Wt 106.0 lb

## 2017-04-27 DIAGNOSIS — M810 Age-related osteoporosis without current pathological fracture: Secondary | ICD-10-CM | POA: Diagnosis not present

## 2017-04-27 DIAGNOSIS — N952 Postmenopausal atrophic vaginitis: Secondary | ICD-10-CM

## 2017-04-27 DIAGNOSIS — R102 Pelvic and perineal pain: Secondary | ICD-10-CM | POA: Diagnosis not present

## 2017-04-27 DIAGNOSIS — Z0000 Encounter for general adult medical examination without abnormal findings: Secondary | ICD-10-CM | POA: Diagnosis not present

## 2017-04-27 DIAGNOSIS — Z01419 Encounter for gynecological examination (general) (routine) without abnormal findings: Secondary | ICD-10-CM

## 2017-04-27 DIAGNOSIS — E559 Vitamin D deficiency, unspecified: Secondary | ICD-10-CM | POA: Diagnosis not present

## 2017-04-27 DIAGNOSIS — E7800 Pure hypercholesterolemia, unspecified: Secondary | ICD-10-CM | POA: Diagnosis not present

## 2017-04-27 DIAGNOSIS — R739 Hyperglycemia, unspecified: Secondary | ICD-10-CM | POA: Diagnosis not present

## 2017-04-27 DIAGNOSIS — I10 Essential (primary) hypertension: Secondary | ICD-10-CM | POA: Diagnosis not present

## 2017-04-27 NOTE — Progress Notes (Signed)
Kara Campos 1936-07-16 [id number]        81 y.o.  G2P2 for breast and pelvic exam. Patient also notes recently had a fall where she fractured her left hip. History of osteoporosis in the past as noted below. Patient also complaining of some pressure feeling and wonders whether she is having a dropped bladder that occurred when she fell. No urinary symptoms such as frequency dysuria or urgency. Is having some constipation after her surgery for treatable to the narcotics and bed rest.  Past medical history,surgical history, problem list, medications, allergies, family history and social history were all reviewed and documented as reviewed in the EPIC chart.  ROS:  Performed with pertinent positives and negatives included in the history, assessment and plan.   Additional significant findings :  None   Exam: Caryn Bee assistant Vitals:   04/27/17 1159  BP: 116/74  Weight: 106 lb (48.1 kg)  Height: 5\' 1"  (1.549 m)   Body mass index is 20.03 kg/m.  General appearance:  Normal affect, orientation and appearance. Skin: Grossly normal HEENT: Without gross lesions.  No cervical or supraclavicular adenopathy. Thyroid normal.  Lungs:  Clear without wheezing, rales or rhonchi Cardiac: RR, without RMG Abdominal:  Soft, nontender, without masses, guarding, rebound, organomegaly or hernia Breasts:  Examined lying and sitting without masses, retractions, discharge or axillary adenopathy. Pelvic:  Ext, BUS, Vagina: With atrophic changes. Cuff well supported. No significant cystocele or rectocele.  Adnexa: Without masses or tenderness    Anus and perineum: Normal   Rectovaginal: Normal sphincter tone without palpated masses or tenderness.    Assessment/Plan:  81 y.o. G2P2 female for breast and pelvic exam.   1. Osteoporosis. DEXA 2010 T score -2.7. Apparently had transient trials of oral bisphosphonates and did not tolerate this from a GI standpoint. Had been counseled multiple times for  other treatment options with recommendations for treatment both by Dr. Cherylann Banas and myself. Had agreed to proceed with Prolia 2015 but ultimately never followed up for this Was to schedule a bone density 2015 but never followed up for this.  I reviewed with her given her fracture she is at a significant risk of future fractures and my strong recommendation to consider treatment. I would recommend starting Prolia now. What is involved with the medication as far as administration as well as risks to include osteonecrosis of the jaw, atypical fractures, rashes and increased infection rates. My strong recommendation would be to proceed with this but she still is reluctant to consider this. I did recommend a baseline DEXA now even though I recommend treatment regardless it will give [place] a baseline to measure against if she does start treatment. Patient will follow up for the bone density and then we will further discuss about proceeding with Prolia. 2. Pelvic pressure. Exam without significant cystocele or rectocele. We'll check UA today. No symptoms to suggest UTI. Reviewed Kegel exercises with her to see if this does not help with her sensation. 3. Pap smear 2010. No Pap smear done today. Status post hysterectomy in the past. Normal Pap smears afterwards. Per current screening guidelines with both elect to stop screening. 4. Mammography 2014. I strongly recommended she schedule a screening mammography. Most common cancer in women and benefits of early detection reviewed. SBE monthly reviewed. 5. Colonoscopy 2015. Repeat at their recommended interval. 6. Continues to smoke despite recommendations to quit with no plans to quit. 7. Health maintenance. No routine lab work done today as this is done  elsewhere. Follow up for DEXA and treatment discussion. Follow up in one year for exam.  Additional time in excess of her breast and pelvic exam was spent in direct face to face counseling and coordination of care in  regards to her osteoporosis and pelvic pressure.      Anastasio Auerbach MD, 12:41 PM 04/27/2017

## 2017-04-27 NOTE — Addendum Note (Signed)
Addended by: Joaquin Music on: 04/27/2017 12:55 PM   Modules accepted: Orders

## 2017-04-27 NOTE — Patient Instructions (Signed)
Follow up for bone density as scheduled.  Follow up for discussion about treatment for your osteoporosis.  Follow up for a screening mammography.

## 2017-05-07 NOTE — Addendum Note (Signed)
Addendum  created 05/07/17 1107 by Tagen Milby, MD   Sign clinical note    

## 2017-05-12 DIAGNOSIS — Z0000 Encounter for general adult medical examination without abnormal findings: Secondary | ICD-10-CM | POA: Diagnosis not present

## 2017-05-12 DIAGNOSIS — I10 Essential (primary) hypertension: Secondary | ICD-10-CM | POA: Diagnosis not present

## 2017-05-12 DIAGNOSIS — R739 Hyperglycemia, unspecified: Secondary | ICD-10-CM | POA: Diagnosis not present

## 2017-05-12 DIAGNOSIS — E7800 Pure hypercholesterolemia, unspecified: Secondary | ICD-10-CM | POA: Diagnosis not present

## 2017-05-21 DIAGNOSIS — Z471 Aftercare following joint replacement surgery: Secondary | ICD-10-CM | POA: Diagnosis not present

## 2017-05-21 DIAGNOSIS — Z4789 Encounter for other orthopedic aftercare: Secondary | ICD-10-CM | POA: Diagnosis not present

## 2017-05-21 DIAGNOSIS — Z96642 Presence of left artificial hip joint: Secondary | ICD-10-CM | POA: Diagnosis not present

## 2017-05-21 DIAGNOSIS — M5136 Other intervertebral disc degeneration, lumbar region: Secondary | ICD-10-CM | POA: Diagnosis not present

## 2017-05-25 DIAGNOSIS — M5136 Other intervertebral disc degeneration, lumbar region: Secondary | ICD-10-CM | POA: Diagnosis not present

## 2017-05-27 DIAGNOSIS — M5136 Other intervertebral disc degeneration, lumbar region: Secondary | ICD-10-CM | POA: Diagnosis not present

## 2017-05-31 DIAGNOSIS — N3001 Acute cystitis with hematuria: Secondary | ICD-10-CM | POA: Diagnosis not present

## 2017-06-03 DIAGNOSIS — M5136 Other intervertebral disc degeneration, lumbar region: Secondary | ICD-10-CM | POA: Diagnosis not present

## 2017-06-07 DIAGNOSIS — M5136 Other intervertebral disc degeneration, lumbar region: Secondary | ICD-10-CM | POA: Diagnosis not present

## 2017-06-10 DIAGNOSIS — M5136 Other intervertebral disc degeneration, lumbar region: Secondary | ICD-10-CM | POA: Diagnosis not present

## 2017-06-15 DIAGNOSIS — M5136 Other intervertebral disc degeneration, lumbar region: Secondary | ICD-10-CM | POA: Diagnosis not present

## 2017-06-17 DIAGNOSIS — M5136 Other intervertebral disc degeneration, lumbar region: Secondary | ICD-10-CM | POA: Diagnosis not present

## 2017-06-21 DIAGNOSIS — M5136 Other intervertebral disc degeneration, lumbar region: Secondary | ICD-10-CM | POA: Diagnosis not present

## 2017-06-22 DIAGNOSIS — Z471 Aftercare following joint replacement surgery: Secondary | ICD-10-CM | POA: Diagnosis not present

## 2017-06-22 DIAGNOSIS — M5136 Other intervertebral disc degeneration, lumbar region: Secondary | ICD-10-CM | POA: Diagnosis not present

## 2017-06-22 DIAGNOSIS — Z96642 Presence of left artificial hip joint: Secondary | ICD-10-CM | POA: Diagnosis not present

## 2017-07-02 DIAGNOSIS — Z96642 Presence of left artificial hip joint: Secondary | ICD-10-CM | POA: Diagnosis not present

## 2017-07-02 DIAGNOSIS — S72042D Displaced fracture of base of neck of left femur, subsequent encounter for closed fracture with routine healing: Secondary | ICD-10-CM | POA: Diagnosis not present

## 2017-07-02 DIAGNOSIS — M5136 Other intervertebral disc degeneration, lumbar region: Secondary | ICD-10-CM | POA: Diagnosis not present

## 2017-07-02 DIAGNOSIS — S39012A Strain of muscle, fascia and tendon of lower back, initial encounter: Secondary | ICD-10-CM | POA: Diagnosis not present

## 2017-07-05 DIAGNOSIS — H524 Presbyopia: Secondary | ICD-10-CM | POA: Diagnosis not present

## 2017-07-05 DIAGNOSIS — H401132 Primary open-angle glaucoma, bilateral, moderate stage: Secondary | ICD-10-CM | POA: Diagnosis not present

## 2017-07-05 DIAGNOSIS — H25813 Combined forms of age-related cataract, bilateral: Secondary | ICD-10-CM | POA: Diagnosis not present

## 2017-07-21 DIAGNOSIS — Z471 Aftercare following joint replacement surgery: Secondary | ICD-10-CM | POA: Diagnosis not present

## 2017-07-21 DIAGNOSIS — M5136 Other intervertebral disc degeneration, lumbar region: Secondary | ICD-10-CM | POA: Diagnosis not present

## 2017-07-21 DIAGNOSIS — Z96642 Presence of left artificial hip joint: Secondary | ICD-10-CM | POA: Diagnosis not present

## 2017-07-27 DIAGNOSIS — M5136 Other intervertebral disc degeneration, lumbar region: Secondary | ICD-10-CM | POA: Diagnosis not present

## 2017-08-07 DIAGNOSIS — N3000 Acute cystitis without hematuria: Secondary | ICD-10-CM | POA: Diagnosis not present

## 2017-08-11 ENCOUNTER — Encounter: Payer: Self-pay | Admitting: Gynecology

## 2017-08-11 ENCOUNTER — Ambulatory Visit (INDEPENDENT_AMBULATORY_CARE_PROVIDER_SITE_OTHER): Payer: Medicare Other | Admitting: Gynecology

## 2017-08-11 VITALS — BP 130/80 | Ht 61.0 in | Wt 110.0 lb

## 2017-08-11 DIAGNOSIS — B373 Candidiasis of vulva and vagina: Secondary | ICD-10-CM | POA: Diagnosis not present

## 2017-08-11 DIAGNOSIS — R300 Dysuria: Secondary | ICD-10-CM | POA: Diagnosis not present

## 2017-08-11 DIAGNOSIS — B3731 Acute candidiasis of vulva and vagina: Secondary | ICD-10-CM

## 2017-08-11 LAB — WET PREP FOR TRICH, YEAST, CLUE

## 2017-08-11 MED ORDER — FLUCONAZOLE 150 MG PO TABS: 150.0000 mg | ORAL_TABLET | Freq: Once | ORAL | 0 refills | Status: AC

## 2017-08-11 NOTE — Addendum Note (Signed)
Addended by: Burnett Kanaris on: 08/11/2017 12:02 PM   Modules accepted: Orders

## 2017-08-11 NOTE — Progress Notes (Signed)
    Kara Campos 09/23/1936 [id number]        82 y.o.  G2P2 presents complaining of the onset of urinary frequency with some dysuria starting last week. Also some vaginal itching. No urgency, low back pain, fever or chills. No vaginal discharge or odor. No nausea vomiting diarrhea constipation. Went to an urgent care this past weekend and was started on Keflex. They called her back and said the urine showed some staph but to continue on the Keflex and see her doctor to rule out vaginal infection.  Past medical history,surgical history, problem list, medications, allergies, family history and social history were all reviewed and documented in the EPIC chart.  Directed ROS with pertinent positives and negatives documented in the history of present illness/assessment and plan.  Exam: Kara Campos assistant Vitals:   08/11/17 1103  BP: 130/80  Weight: 110 lb (49.9 kg)  Height: 5\' 1"  (1.549 m)   General appearance:  Normal Abdomen soft nontender without masses guarding rebound Pelvic external BUS vagina with atrophic changes. Scant discharge noted. Bimanual exam without masses or tenderness  Assessment/Plan:  81 y.o. G2P2 with history and exam as above. Wet prep is positive for yeast. Urinalysis shows no white cells with few bacteria but 10-20 squamous cells consistent with contamination. Will check culture. Recommend patient finish her Keflex course of antibiotics. Treat with Diflucan 150 mg tablet now and repeated the end of her Keflex course of antibiotics. Follow up if the symptoms persist, worsen or recur.    Kara Auerbach MD, 11:21 AM 08/11/2017

## 2017-08-11 NOTE — Patient Instructions (Signed)
Take the Diflucan pill now and repeat it at the end of your antibiotic course.

## 2017-08-13 LAB — URINALYSIS W MICROSCOPIC + REFLEX CULTURE
BILIRUBIN URINE: NEGATIVE
Glucose, UA: NEGATIVE
HGB URINE DIPSTICK: NEGATIVE
HYALINE CAST: NONE SEEN /LPF
KETONES UR: NEGATIVE
Leukocyte Esterase: NEGATIVE
NITRITES URINE, INITIAL: NEGATIVE
PROTEIN: NEGATIVE
RBC / HPF: NONE SEEN /HPF (ref 0–2)
Specific Gravity, Urine: 1.02 (ref 1.001–1.03)
WBC, UA: NONE SEEN /HPF (ref 0–5)
pH: 6 (ref 5.0–8.0)

## 2017-08-13 LAB — URINE CULTURE
MICRO NUMBER:: 81011648
RESULT: NO GROWTH
SPECIMEN QUALITY: ADEQUATE

## 2017-08-13 LAB — CULTURE INDICATED

## 2017-08-19 DIAGNOSIS — H524 Presbyopia: Secondary | ICD-10-CM | POA: Diagnosis not present

## 2017-08-19 DIAGNOSIS — H25813 Combined forms of age-related cataract, bilateral: Secondary | ICD-10-CM | POA: Diagnosis not present

## 2017-08-19 DIAGNOSIS — H401132 Primary open-angle glaucoma, bilateral, moderate stage: Secondary | ICD-10-CM | POA: Diagnosis not present

## 2017-08-23 DIAGNOSIS — R300 Dysuria: Secondary | ICD-10-CM | POA: Diagnosis not present

## 2017-08-24 DIAGNOSIS — Z23 Encounter for immunization: Secondary | ICD-10-CM | POA: Diagnosis not present

## 2017-09-06 DIAGNOSIS — H2512 Age-related nuclear cataract, left eye: Secondary | ICD-10-CM | POA: Diagnosis not present

## 2017-09-13 DIAGNOSIS — N390 Urinary tract infection, site not specified: Secondary | ICD-10-CM | POA: Diagnosis not present

## 2017-09-13 DIAGNOSIS — R300 Dysuria: Secondary | ICD-10-CM | POA: Diagnosis not present

## 2017-09-20 DIAGNOSIS — H25812 Combined forms of age-related cataract, left eye: Secondary | ICD-10-CM | POA: Diagnosis not present

## 2017-09-20 DIAGNOSIS — H2512 Age-related nuclear cataract, left eye: Secondary | ICD-10-CM | POA: Diagnosis not present

## 2017-09-27 DIAGNOSIS — H2511 Age-related nuclear cataract, right eye: Secondary | ICD-10-CM | POA: Diagnosis not present

## 2017-10-04 DIAGNOSIS — H25811 Combined forms of age-related cataract, right eye: Secondary | ICD-10-CM | POA: Diagnosis not present

## 2017-10-04 DIAGNOSIS — H2511 Age-related nuclear cataract, right eye: Secondary | ICD-10-CM | POA: Diagnosis not present

## 2017-11-04 DIAGNOSIS — E7800 Pure hypercholesterolemia, unspecified: Secondary | ICD-10-CM | POA: Diagnosis not present

## 2017-11-04 DIAGNOSIS — R739 Hyperglycemia, unspecified: Secondary | ICD-10-CM | POA: Diagnosis not present

## 2017-11-04 DIAGNOSIS — E559 Vitamin D deficiency, unspecified: Secondary | ICD-10-CM | POA: Diagnosis not present

## 2017-11-04 DIAGNOSIS — I10 Essential (primary) hypertension: Secondary | ICD-10-CM | POA: Diagnosis not present

## 2017-11-11 DIAGNOSIS — E559 Vitamin D deficiency, unspecified: Secondary | ICD-10-CM | POA: Diagnosis not present

## 2017-11-11 DIAGNOSIS — I10 Essential (primary) hypertension: Secondary | ICD-10-CM | POA: Diagnosis not present

## 2017-11-11 DIAGNOSIS — E7800 Pure hypercholesterolemia, unspecified: Secondary | ICD-10-CM | POA: Diagnosis not present

## 2017-12-06 DIAGNOSIS — F17210 Nicotine dependence, cigarettes, uncomplicated: Secondary | ICD-10-CM | POA: Insufficient documentation

## 2017-12-06 DIAGNOSIS — R030 Elevated blood-pressure reading, without diagnosis of hypertension: Secondary | ICD-10-CM | POA: Insufficient documentation

## 2017-12-06 DIAGNOSIS — R079 Chest pain, unspecified: Secondary | ICD-10-CM | POA: Diagnosis not present

## 2017-12-06 DIAGNOSIS — Z79899 Other long term (current) drug therapy: Secondary | ICD-10-CM | POA: Diagnosis not present

## 2017-12-06 DIAGNOSIS — R0789 Other chest pain: Secondary | ICD-10-CM | POA: Insufficient documentation

## 2017-12-06 DIAGNOSIS — I10 Essential (primary) hypertension: Secondary | ICD-10-CM | POA: Diagnosis not present

## 2017-12-06 NOTE — ED Triage Notes (Signed)
Pt presents from home for evaluation of high blood pressure. Pt also reports indigestion pain in chest. Pt reports being started on Losartan/HCTZ 100-25 on 12/06/17.

## 2017-12-07 ENCOUNTER — Emergency Department (HOSPITAL_COMMUNITY): Payer: Medicare Other

## 2017-12-07 ENCOUNTER — Emergency Department (HOSPITAL_COMMUNITY)
Admission: EM | Admit: 2017-12-07 | Discharge: 2017-12-07 | Disposition: A | Discharge status: Home / Self Care | Payer: Medicare Other | Attending: Emergency Medicine | Admitting: Emergency Medicine

## 2017-12-07 ENCOUNTER — Other Ambulatory Visit: Payer: Self-pay

## 2017-12-07 ENCOUNTER — Encounter (HOSPITAL_COMMUNITY): Payer: Self-pay | Admitting: Emergency Medicine

## 2017-12-07 DIAGNOSIS — R079 Chest pain, unspecified: Secondary | ICD-10-CM | POA: Diagnosis not present

## 2017-12-07 DIAGNOSIS — R030 Elevated blood-pressure reading, without diagnosis of hypertension: Secondary | ICD-10-CM | POA: Diagnosis not present

## 2017-12-07 LAB — CBC
HEMATOCRIT: 41.7 % (ref 36.0–46.0)
Hemoglobin: 14.1 g/dL (ref 12.0–15.0)
MCH: 33.3 pg (ref 26.0–34.0)
MCHC: 33.8 g/dL (ref 30.0–36.0)
MCV: 98.3 fL (ref 78.0–100.0)
Platelets: 270 10*3/uL (ref 150–400)
RBC: 4.24 MIL/uL (ref 3.87–5.11)
RDW: 13.6 % (ref 11.5–15.5)
WBC: 8.5 10*3/uL (ref 4.0–10.5)

## 2017-12-07 LAB — BASIC METABOLIC PANEL
Anion gap: 9 (ref 5–15)
BUN: 21 mg/dL — AB (ref 6–20)
CALCIUM: 9.3 mg/dL (ref 8.9–10.3)
CO2: 24 mmol/L (ref 22–32)
Chloride: 104 mmol/L (ref 101–111)
Creatinine, Ser: 0.7 mg/dL (ref 0.44–1.00)
GFR calc Af Amer: >60 mL/min (ref >60)
GLUCOSE: 123 mg/dL — AB (ref 65–99)
POTASSIUM: 3.9 mmol/L (ref 3.5–5.1)
Sodium: 137 mmol/L (ref 135–145)

## 2017-12-07 LAB — I-STAT TROPONIN, ED: Troponin i, poc: 0 ng/mL (ref 0.00–0.08)

## 2017-12-07 MED ORDER — AMLODIPINE BESYLATE 5 MG PO TABS: 5.0000 mg | ORAL_TABLET | Freq: Every day | ORAL | 0 refills | Status: DC

## 2017-12-07 MED ORDER — AMLODIPINE BESYLATE 5 MG PO TABS
5.0000 mg | ORAL_TABLET | Freq: Once | ORAL | Status: AC
  Administered 2017-12-07: 5 mg via ORAL
  Filled 2017-12-07: qty 1

## 2017-12-07 NOTE — ED Provider Notes (Signed)
[facility] DEPT Provider Note   CSN: [id number] Arrival date & time: 12/06/17  2321     History   Chief Complaint Chief Complaint  Patient presents with  . Hypertension    HPI Kara Campos is a 82 y.o. female.  HPI Patient presents the emergency department with complaints of elevated blood pressure at home.  She states she was recently started on losartan hydrochlorothiazide as she had developed some transient intermittent lower extremity edema associated with her long-term Norvasc.  She states Norvasc controlled her blood pressure well.  She would prefer to be back on the Norvasc.  She denies chest pain shortness of breath.  She reports earlier tonight she felt flushed in the face which is why she checked her blood pressure.  It sounds as though she is also checking her blood pressure somewhat frequently at home because of concerns of the elevated blood pressure.  She does report some dietary indiscretion and high salt foods.  She is not exercising daily.   Past Medical History:  Diagnosis Date  . Arthritis   . Blood transfusion   . Cataract   . Diabetes mellitus    borderline  . Hemorrhoids   . Hyperlipidemia   . Osteoporosis     Patient Active Problem List   Diagnosis Date Noted  . Hip fracture ([place]) 02/12/2017  . Candidiasis of anus and buttock 06/24/2012  . Diarrhea 06/24/2012  . External hemorrhoids 05/04/2012  . Internal hemorrhoids with complication 25/42/7062    Past Surgical History:  Procedure Laterality Date  . TOTAL HIP ARTHROPLASTY Left 02/13/2017   Procedure: TOTAL HIP ARTHROPLASTY ANTERIOR APPROACH;  Surgeon: Paralee Cancel, MD;  Location: [facility];  Service: Orthopedics;  Laterality: Left;  [name] VAGINAL HYSTERECTOMY  1985    OB History    Gravida Para Term Preterm AB Living   2 2       2    SAB TAB Ectopic Multiple Live Births                   Home Medications    Prior to Admission medications   Medication Sig  Start Date End Date Taking? Authorizing Provider  amLODipine (NORVASC) 5 MG tablet Take 1 tablet (5 mg total) by mouth daily. 12/07/17   Jola Schmidt, MD  Calcium Carbonate-Vitamin D (CALTRATE 600+D) 600-400 MG-UNIT per tablet Take 1 tablet by mouth daily.    [provider]  Cholecalciferol (VITAMIN D PO) Take by mouth.    [provider]  co-enzyme Q-10 30 MG capsule Take 50 mg by mouth once a week.    [provider]  fish oil-omega-3 fatty acids 1000 MG capsule Take 2 g by mouth daily.    [provider]  losartan (COZAAR) 25 MG tablet Take 1 tablet by mouth daily.    [provider]  Magnesium Citrate 100 MG TABS Take 100 mg by mouth at bedtime.    [provider]  nortriptyline (PAMELOR) 10 MG capsule Take 10 mg by mouth at bedtime.    [provider]  timolol (BETIMOL) 0.5 % ophthalmic solution Place 1 drop into both eyes daily.    [provider]    Family History Family History  Problem Relation Age of Onset  . Ovarian cancer Maternal Grandmother     Social History Social History   Tobacco Use  . Smoking status: Current Every Day Smoker    Packs/day: 0.50    Types: Cigarettes  .  Smokeless tobacco: Never Used  Substance Use Topics  . Alcohol use: Yes    Alcohol/week: 0.0 oz    Comment: Occas  . Drug use: No     Allergies   Codeine; Macrobid [nitrofurantoin macrocrystal]; and Sulfa antibiotics   Review of Systems Review of Systems  All other systems reviewed and are negative.    Physical Exam Updated Vital Signs BP (!) 198/75   Pulse 86   Temp 98.1 F (36.7 C) (Oral)   Resp 18   Ht 5' (1.524 m)   Wt 49.9 kg (110 lb)   SpO2 99%   BMI 21.48 kg/m   Physical Exam  Constitutional: She is oriented to person, place, and time. She appears well-developed and well-nourished.  HENT:  Head: Normocephalic.  Eyes: EOM are normal.  Neck: Normal range of motion.  Cardiovascular: Normal rate  and regular rhythm.  Pulmonary/Chest: Effort normal and breath sounds normal.  Abdominal: She exhibits no distension.  Musculoskeletal: Normal range of motion.  Neurological: She is alert and oriented to person, place, and time.  Psychiatric: She has a normal mood and affect.  Nursing note and vitals reviewed.    ED Treatments / Results  Labs (all labs ordered are listed, but only abnormal results are displayed) Labs Reviewed  BASIC METABOLIC PANEL - Abnormal; Notable for the following components:      Result Value   Glucose, Bld 123 (*)    BUN 21 (*)    All other components within normal limits  CBC  I-STAT TROPONIN, ED    EKG  EKG Interpretation  Date/Time:  Monday December 06 2017 23:56:40 EST Ventricular Rate:  86 PR Interval:    QRS Duration: 96 QT Interval:  434 QTC Calculation: 520 R Axis:   -30 Text Interpretation:  Sinus rhythm Probable left atrial enlargement Left axis deviation Abnormal R-wave progression, early transition Nonspecific T abnrm, anterolateral leads Prolonged QT interval Baseline wander in lead(s) I II aVR aVF V1 No old tracing to compare Confirmed by Jola Schmidt [phone]) on 12/07/2017 3:31:50 AM       Radiology Dg Chest 2 View  Result Date: 12/07/2017 CLINICAL DATA:  82 y/o  F; hypertension and chest pain. EXAM: CHEST  2 VIEW COMPARISON:  02/12/2017 chest radiograph FINDINGS: Stable cardiomegaly given projection and technique. Aortic atherosclerosis with calcification. Clear lungs. No pleural effusion or pneumothorax. No acute osseous abnormality is evident. IMPRESSION: Stable mild cardiomegaly and aortic atherosclerosis. No acute pulmonary process identified. Electronically Signed   By: Kristine Garbe M.D.   On: 12/07/2017 00:39    Procedures Procedures (including critical care time)  Medications Ordered in ED Medications  amLODipine (NORVASC) tablet 5 mg (not administered)     Initial Impression / Assessment and Plan / ED Course    I have reviewed the triage vital signs and the nursing notes.  Pertinent labs & imaging results that were available during my care of the patient were reviewed by me and considered in my medical decision making (see chart for details).     Patient is overall well-appearing.  No symptoms to suggest endorgan damage.  No chest pain or shortness of breath.  Blood pressure elevated.  Patient will be given a dose of Norvasc here in the emergency department and instructed to follow back up with her primary team and/or cardiology for management of blood pressure.  I will restart her Norvasc.  She will discontinue her new blood pressure medication.  I have instructed her to make better  dietary choices and to walk daily.  Patient and family understand to return to the emergency department for any new or worsening symptoms  Final Clinical Impressions(s) / ED Diagnoses   Final diagnoses:  Elevated blood pressure reading    ED Discharge Orders        Ordered    amLODipine (NORVASC) 5 MG tablet  Daily     12/07/17 0441       Jola Schmidt, MD 12/07/17 0448

## 2017-12-08 DIAGNOSIS — I10 Essential (primary) hypertension: Secondary | ICD-10-CM | POA: Diagnosis not present

## 2017-12-13 DIAGNOSIS — J329 Chronic sinusitis, unspecified: Secondary | ICD-10-CM | POA: Diagnosis not present

## 2017-12-15 ENCOUNTER — Ambulatory Visit: Payer: PRIVATE HEALTH INSURANCE | Admitting: Adult Health

## 2017-12-17 ENCOUNTER — Encounter: Payer: Self-pay | Admitting: *Deleted

## 2017-12-18 DIAGNOSIS — B338 Other specified viral diseases: Secondary | ICD-10-CM | POA: Diagnosis not present

## 2017-12-20 ENCOUNTER — Encounter: Payer: Self-pay | Admitting: Physician Assistant

## 2017-12-20 ENCOUNTER — Ambulatory Visit (INDEPENDENT_AMBULATORY_CARE_PROVIDER_SITE_OTHER): Payer: Medicare Other | Admitting: Physician Assistant

## 2017-12-20 VITALS — BP 159/74 | HR 66 | Ht 60.0 in | Wt 113.2 lb

## 2017-12-20 DIAGNOSIS — R079 Chest pain, unspecified: Secondary | ICD-10-CM | POA: Diagnosis not present

## 2017-12-20 DIAGNOSIS — I10 Essential (primary) hypertension: Secondary | ICD-10-CM | POA: Diagnosis not present

## 2017-12-20 MED ORDER — AMLODIPINE BESYLATE 2.5 MG PO TABS: 2.5000 mg | ORAL_TABLET | Freq: Two times a day (BID) | ORAL | 6 refills | Status: DC

## 2017-12-20 NOTE — Progress Notes (Signed)
Cardiology Office Note   Date:  12/20/2017   ID:  Kara Campos, DOB 04-30-1936, MRN [id number]  PCP:  [name], MD  Cardiologist:  Dr Sallyanne Kuster, saw her several years ago Darreld Mclean, [place]   Chief Complaint  Patient presents with  . Hypertension    History of Present Illness: Kara Campos is a 82 y.o. female with a history of HTN, borderline DM, HLD, OA.  Admitted, 03/16-3/19/2018 for fall with left femoral neck fracture, s/p THR  ER visit 12/07/2017 for HTN, LE edema on Amlodipine, now on losartan/HCTZ. SBP 198/75, Amlodipine 5 mg daily restarted  Kara Campos presents for cardiology and BP management.  She previously took Amlodipine 2.5mg  daily for her hypertension.  He PCP recently decreased this to 2.5 daily because of some LE edema.   She was then started on Losartan 25 mg bid because her BP was not controlled on the Amlodipine 2.5 daily. It is unclear if she was taken off the amlodipine or not. She felt hyper and could not sit still. Her BP was still not controlled, so she was changed to losartan/HCTZ 100/25 mg. She took the rx and woke up 2 hours later with a red, flushed face, felt anxious, BP 202/94. She denies any CP or SOB at that time.  This sparked the ER visit. She was restarted on the Amlodipine 2.5mg  upon discharge from the ED and is tolerating this well. She states her SBP can be in 110s and on rare occasions is in the 90s. She is slightly reluctant to take 5mg  in the morning because she states it has made her feel bad in the past.   She drinks 1-2 glasses of wine per night. Her BP will go down on this. However, after she goes to bed, her BP may be elevated.  She occasionally has lower leg edema on the Amlodipine, but she reports this is rare and normally occurs when she has been on her feet all day. She is not concerned about this side effect.  She had some CP the night that she went to the ER but thought it was indigestion. She gets  indigestion occasionally, treats it with Tums and it resolves.   She does not exercise but is able to do housework without having CP/SOB. She is still walking with a cane since her hip surgery in 01/2017.   Past Medical History:  Diagnosis Date  . Arthritis   . Blood transfusion   . Cataract   . Diabetes mellitus    borderline  . Hemorrhoids   . Hyperlipidemia   . Osteoporosis     Past Surgical History:  Procedure Laterality Date  . TOTAL HIP ARTHROPLASTY Left 02/13/2017   Procedure: TOTAL HIP ARTHROPLASTY ANTERIOR APPROACH;  Surgeon: Paralee Cancel, MD;  Location: [facility];  Service: Orthopedics;  Laterality: Left;  [name] VAGINAL HYSTERECTOMY  1985    Current Outpatient Medications  Medication Sig Dispense Refill  . amLODipine (NORVASC) 2.5 MG tablet Take 1 tablet (2.5 mg total) by mouth 2 (two) times daily. 60 tablet 6  . Calcium Carbonate-Vitamin D (CALTRATE 600+D) 600-400 MG-UNIT per tablet Take 1 tablet by mouth daily.    . Cholecalciferol (VITAMIN D PO) Take by mouth.    . co-enzyme Q-10 30 MG capsule Take 50 mg by mouth once a week.    . fish oil-omega-3 fatty acids 1000 MG capsule Take 2 g by mouth daily.    . Magnesium Citrate 100 MG TABS  Take 100 mg by mouth at bedtime.    . nortriptyline (PAMELOR) 10 MG capsule Take 10 mg by mouth at bedtime.    . timolol (BETIMOL) 0.5 % ophthalmic solution Place 1 drop into both eyes daily.     No current facility-administered medications for this visit.     Allergies:   Codeine; Macrobid [nitrofurantoin macrocrystal]; and Sulfa antibiotics    Social History:  The patient  reports that she has been smoking cigarettes.  She has been smoking about 0.50 packs per day. she has never used smokeless tobacco. She reports that she drinks alcohol. She reports that she does not use drugs.   Family History:  The patient's family history includes Ovarian cancer in her maternal grandmother.    ROS:  Please see the history of present illness. All  other systems are reviewed and negative.    PHYSICAL EXAM: VS:  BP (!) 159/74 (BP Location: Right Arm)   Pulse 66   Ht 5' (1.524 m)   Wt 113 lb 3.2 oz (51.3 kg)   SpO2 100%   BMI 22.11 kg/m  , BMI Body mass index is 22.11 kg/m. GEN: Well nourished, well developed, female in no acute distress  HEENT: normal for age  Neck: no JVD, no carotid bruit, no masses Cardiac: RRR; no murmur, no rubs, or gallops Respiratory:  clear to auscultation bilaterally, normal work of breathing GI: soft, nontender, nondistended, + BS MS: no deformity or atrophy; no edema; distal pulses are 2+ in all 4 extremities   Skin: warm and dry, no rash Neuro:  Strength and sensation are intact Psych: euthymic mood, full affect   EKG:  EKG is not ordered today. ECG from 12/06/2017 ER visit reviewed, SR, HR 86, no acute ischemic changes, no Q waves, normal intervals  Recent Labs: 02/13/2017: ALT 17 12/07/2017: BUN 21; Creatinine, Ser 0.70; Hemoglobin 14.1; Platelets 270; Potassium 3.9; Sodium 137    Lipid Panel No results found for: CHOL, TRIG, HDL, CHOLHDL, VLDL, LDLCALC, LDLDIRECT   Wt Readings from Last 3 Encounters:  12/20/17 113 lb 3.2 oz (51.3 kg)  12/07/17 110 lb (49.9 kg)  08/11/17 110 lb (49.9 kg)     Other studies Reviewed: Additional studies/ records that were reviewed today include: None.  ASSESSMENT AND PLAN:  1.  Hypertension - Patient was recently started on Losartan given poor control of BP. She had no improvement with the Losartan so dose was increased and then Losartan-HCTZ was tried.   After first dose of Losaran-HCTZ, she woke up 2 hours later and felt very anxious and had a red, rush face. She took her BP at that time and it was 202/94. She was seen in the ED and had a negative cardiac workup.She was restarted on the Amlodipine upon discharge from the ED. - BP today 159/74. - Continue Amlodipine 2.5mg  BID. OK to take TID if needed. Patient to monitor BP at home and experiment  taking 2.5 BID, 2.5mg  TID, or 5mg  in the morning and 2.5mg  in the afternoon/evening. - Patient to keep a BP diary and bring to next appointment. - She was advised goal SBP 130-140  2. Chest pain:  - sx occur after eating fried or spicy foods and resolve w/ Tums - likely GI in origin, contact us if sx worsen, no eval for now.  Current medicines are reviewed at length with the patient today.  The patient has concerns regarding medicines. She does not want to take Losartan ever again and she does  not want to be on a diuretic.  The following changes have been made: Amlodipine 2.5mg  BID. OK to take TID if needed.  Labs/ tests ordered today include: None No orders of the defined types were placed in this encounter.    Disposition:   FU with Dr. Sallyanne Kuster  Signed, Darreld Mclean, Student-PA [name], PA-C   12/20/2017 2:45 PM    [place] Phone: [phone]; Fax: [phone]  This note was written with the assistance of speech recognition software. Please excuse any transcriptional errors.

## 2017-12-20 NOTE — Patient Instructions (Addendum)
Medication Instructions:  TAKE- Amlodipine 2.5 mg twice a day  If you need a refill on your cardiac medications before your next appointment, please call your pharmacy.  Labwork: None Ordered   Testing/Procedures: None Ordered   Follow-Up: Your physician wants you to follow-up in: February 25th @ 8:40 am with Dr Sallyanne Kuster.  Thank you for choosing [facility] at [facility]!!

## 2018-01-24 ENCOUNTER — Ambulatory Visit: Payer: PRIVATE HEALTH INSURANCE | Admitting: Cardiovascular Disease

## 2018-02-09 DIAGNOSIS — M546 Pain in thoracic spine: Secondary | ICD-10-CM | POA: Insufficient documentation

## 2018-02-09 DIAGNOSIS — Z96642 Presence of left artificial hip joint: Secondary | ICD-10-CM | POA: Diagnosis not present

## 2018-03-17 ENCOUNTER — Ambulatory Visit (INDEPENDENT_AMBULATORY_CARE_PROVIDER_SITE_OTHER): Payer: Medicare Other | Admitting: Cardiovascular Disease

## 2018-03-17 ENCOUNTER — Encounter: Payer: Self-pay | Admitting: Cardiovascular Disease

## 2018-03-17 DIAGNOSIS — M25471 Effusion, right ankle: Secondary | ICD-10-CM

## 2018-03-17 DIAGNOSIS — I10 Essential (primary) hypertension: Secondary | ICD-10-CM

## 2018-03-17 DIAGNOSIS — M25472 Effusion, left ankle: Secondary | ICD-10-CM

## 2018-03-17 MED ORDER — AMLODIPINE BESYLATE 2.5 MG PO TABS: 2.5000 mg | ORAL_TABLET | ORAL | 3 refills | Status: DC

## 2018-03-17 MED ORDER — LOSARTAN POTASSIUM 25 MG PO TABS: 25.0000 mg | ORAL_TABLET | Freq: Every evening | ORAL | 3 refills | Status: DC

## 2018-03-17 NOTE — Patient Instructions (Signed)
Dr Sallyanne Kuster has recommended making the following medication changes: 1. START Losartan 25 mg - take 1 tablet at BEDTIME 2. DECREASE Amlodipine to 2.5 mg ONCE daily every morning  Your physician recommends that you schedule a follow-up appointment in 2-4 weeks with [name], PA-c.  Dr Sallyanne Kuster recommends that you schedule a follow-up appointment in 6 months. You will receive a reminder letter in the mail two months in advance. If you don't receive a letter, please call our office to schedule the follow-up appointment.  If you need a refill on your cardiac medications before your next appointment, please call your pharmacy.

## 2018-03-17 NOTE — Progress Notes (Signed)
Cardiology Office Note:    Date:  03/17/2018   ID:  Kara Campos, DOB 04/06/1936, MRN [id number]  PCP:  [name], MD  Cardiologist:  No primary care provider on file.   Referring MD: [name], MD   Chief Complaint  Patient presents with  . 1 month f/u    pt c/o swelling in bilateral lower extremities--states "feels like I am swollen everywhere"    History of Present Illness:    Kara Campos is a 82 y.o. female with a hx of long-standing systemic hypertension, currently managed with amlodipine, presents with complaints of lower extremity edema.  Her ankle swelling does not always resolve after recumbent position and sometimes uncomfortable making it hard to put her shoes on.  She does not have dyspnea.  She has noticed that her blood pressure is often higher in the morning and in the evenings.  Her ankle swelling is a little better after she stopped taking her midodrine twice daily. A past attempt was made to switch her to losartan monotherapy which did not work.  When she was given losartan/hydrochlorothiazide 100/25 mg she became abruptly red, left, extremely anxious and had severely elevated blood pressure.  She does have an allergy to sulfa.  The patient specifically denies any chest pain at rest or exertion, dyspnea at rest or with exertion, orthopnea, paroxysmal nocturnal dyspnea, syncope, palpitations, focal neurological deficits, intermittent claudication,  unexplained weight gain, cough, hemoptysis or wheezing.   Past Medical History:  Diagnosis Date  . Arthritis   . Blood transfusion   . Cataract   . Diabetes mellitus    borderline  . Hemorrhoids   . Hyperlipidemia   . Osteoporosis     Past Surgical History:  Procedure Laterality Date  . TOTAL HIP ARTHROPLASTY Left 02/13/2017   Procedure: TOTAL HIP ARTHROPLASTY ANTERIOR APPROACH;  Surgeon: Paralee Cancel, MD;  Location: [facility];  Service: Orthopedics;  Laterality: Left;  [name] VAGINAL HYSTERECTOMY  1985    Current  Medications: Current Meds  Medication Sig  . amLODipine (NORVASC) 2.5 MG tablet Take 1 tablet (2.5 mg total) by mouth 2 (two) times daily.  . Calcium Carbonate-Vitamin D (CALTRATE 600+D) 600-400 MG-UNIT per tablet Take 1 tablet by mouth daily.  . Cholecalciferol (VITAMIN D PO) Take by mouth.  . co-enzyme Q-10 30 MG capsule Take 50 mg by mouth once a week.  . fish oil-omega-3 fatty acids 1000 MG capsule Take 2 g by mouth daily.  . Magnesium Citrate 100 MG TABS Take 100 mg by mouth at bedtime.  . nortriptyline (PAMELOR) 10 MG capsule Take 10 mg by mouth at bedtime.  . timolol (BETIMOL) 0.5 % ophthalmic solution Place 1 drop into both eyes daily.     Allergies:   Codeine; Macrobid [nitrofurantoin macrocrystal]; and Sulfa antibiotics   Social History   Socioeconomic History  . Marital status: Married    Spouse name: Not on file  . Number of children: Not on file  . Years of education: Not on file  . Highest education level: Not on file  Occupational History  . Not on file  Social Needs  . Financial resource strain: Not on file  . Food insecurity:    Worry: Not on file    Inability: Not on file  . Transportation needs:    Medical: Not on file    Non-medical: Not on file  Tobacco Use  . Smoking status: Current Every Day Smoker    Packs/day: 0.50    Types:  Cigarettes  . Smokeless tobacco: Never Used  Substance and Sexual Activity  . Alcohol use: Yes    Alcohol/week: 0.0 oz    Comment: Occas  . Drug use: No  . Sexual activity: Yes    Birth control/protection: Post-menopausal, Surgical    Comment: HYST-1st intercourse 47 yo-1 partner  Lifestyle  . Physical activity:    Days per week: Not on file    Minutes per session: Not on file  . Stress: Not on file  Relationships  . Social connections:    Talks on phone: Not on file    Gets together: Not on file    Attends religious service: Not on file    Active member of club or organization: Not on file    Attends meetings of  clubs or organizations: Not on file    Relationship status: Not on file  Other Topics Concern  . Not on file  Social History Narrative  . Not on file     Family History: The patient's family history includes Ovarian cancer in her maternal grandmother.  ROS:   Please see the history of present illness.     All other systems reviewed and are negative.  EKGs/Labs/Other Studies Reviewed:    The following studies were reviewed today: Notes from primary care provider previous cardiology office visit  EKG:  EKG is not ordered today.  The ekg ordered Dec 06, 2017 demonstrates normal sinus rhythm, left atrial abnormality, left axis deviation not quite meeting criteria for left anterior fascicular block , nonspecific ST-T wave changes.  Recent Labs: 12/07/2017: BUN 21; Creatinine, Ser 0.70; Hemoglobin 14.1; Platelets 270; Potassium 3.9; Sodium 137  Recent Lipid Panel No results found for: CHOL, TRIG, HDL, CHOLHDL, VLDL, LDLCALC, LDLDIRECT  Physical Exam:    VS:  BP 136/68   Pulse 63   Ht 5' (1.524 m)   Wt 115 lb (52.2 kg)   BMI 22.46 kg/m     Wt Readings from Last 3 Encounters:  03/17/18 115 lb (52.2 kg)  12/20/17 113 lb 3.2 oz (51.3 kg)  12/07/17 110 lb (49.9 kg)     GEN: Lean, well nourished, well developed in no acute distress HEENT: Normal NECK: No JVD; No carotid bruits LYMPHATICS: No lymphadenopathy CARDIAC: RRR, no murmurs, rubs, gallops RESPIRATORY:  Clear to auscultation without rales, wheezing or rhonchi  ABDOMEN: Soft, non-tender, non-distended MUSCULOSKELETAL:  No edema; No deformity  SKIN: Warm and dry NEUROLOGIC:  Alert and oriented x 3 PSYCHIATRIC:  Normal affect   ASSESSMENT:    No diagnosis found. PLAN:    In order of problems listed above:  1. Edema: Suspect most likely  Amlodipine side effect. I do not think she can tolerate more than 2.5 mg daily.  It also sounds like it is wearing off before the next day so we will add losartan 25 mg at bedtime.   Discussed the fact that we can avoid side effects by using combination of the lower dose agents rather than a single medication.  She is a little apprehensive based on her previous side effects, but I suspect that her poor reaction to losartan HCT may have been related to sulfa allergy. 2. HTN: Close to good control. almost at target   Medication Adjustments/Labs and Tests Ordered: Current medicines are reviewed at length with the patient today.  Concerns regarding medicines are outlined above.  No orders of the defined types were placed in this encounter.  No orders of the defined types were placed in  this encounter.   There are no Patient Instructions on file for this visit.   Signed, Sanda Klein, MD  03/17/2018 11:11 AM    [place]

## 2018-03-18 ENCOUNTER — Encounter: Payer: Self-pay | Admitting: Cardiovascular Disease

## 2018-03-18 DIAGNOSIS — I10 Essential (primary) hypertension: Secondary | ICD-10-CM | POA: Insufficient documentation

## 2018-03-18 DIAGNOSIS — M25472 Effusion, left ankle: Principal | ICD-10-CM

## 2018-03-18 DIAGNOSIS — M25471 Effusion, right ankle: Secondary | ICD-10-CM | POA: Insufficient documentation

## 2018-03-29 ENCOUNTER — Ambulatory Visit: Payer: Medicare Other | Admitting: Cardiovascular Disease

## 2018-04-10 IMAGING — CR DG HIP (WITH OR WITHOUT PELVIS) 2-3V*L*
3 series · 3 of 3 positions shown · non-contrast
Comparison: None.

CLINICAL DATA: Fall today with left hip pain.

EXAM:
DG HIP (WITH OR WITHOUT PELVIS) 2-3V LEFT

[w hip lat left]
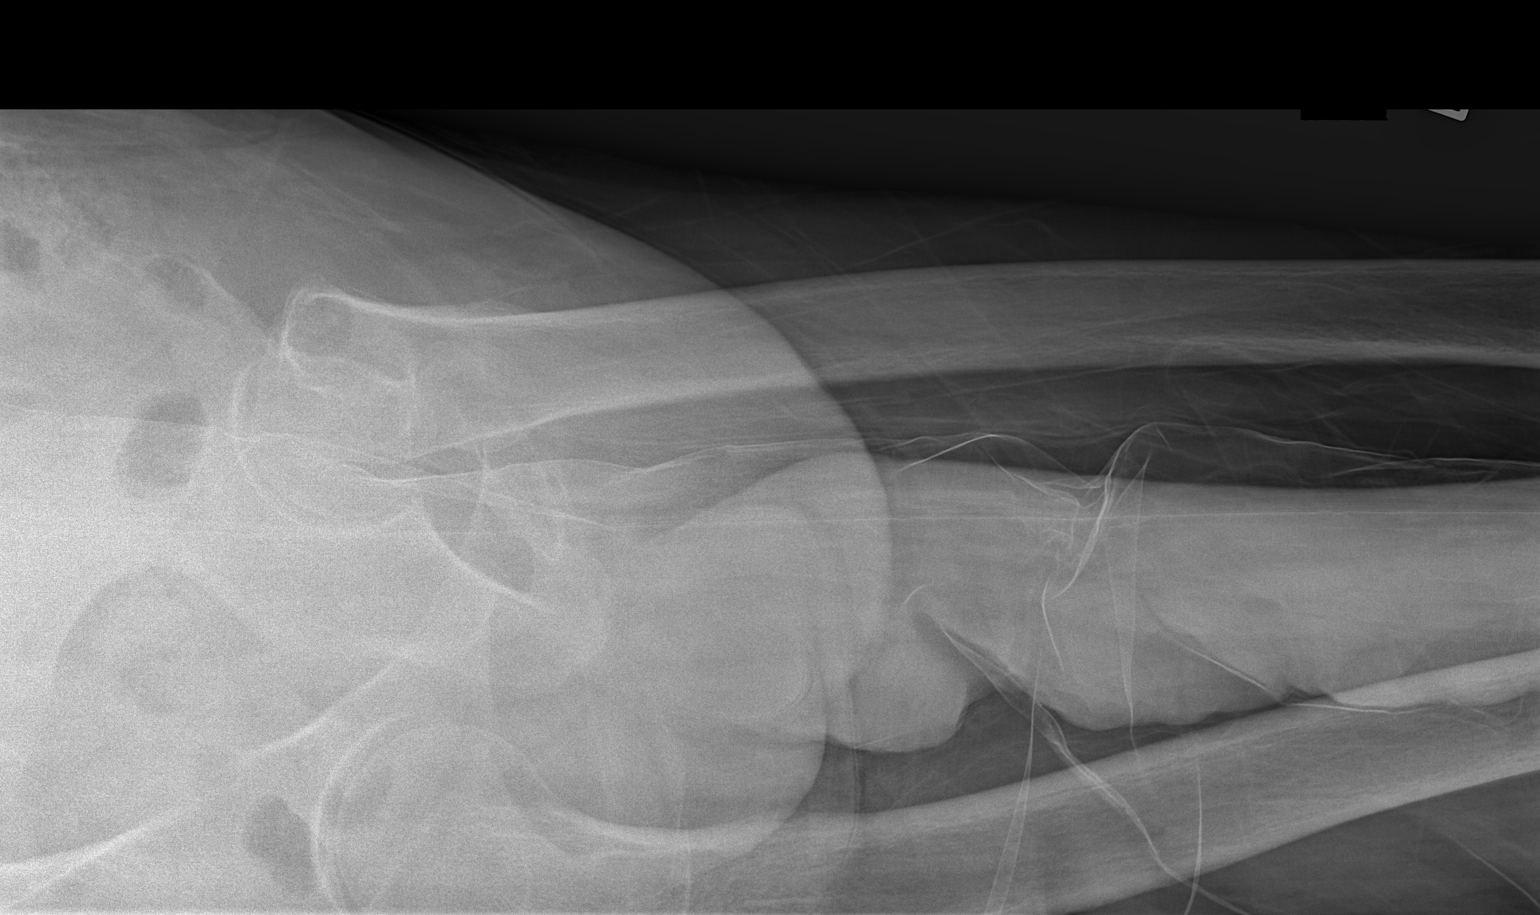

[x pelvis]
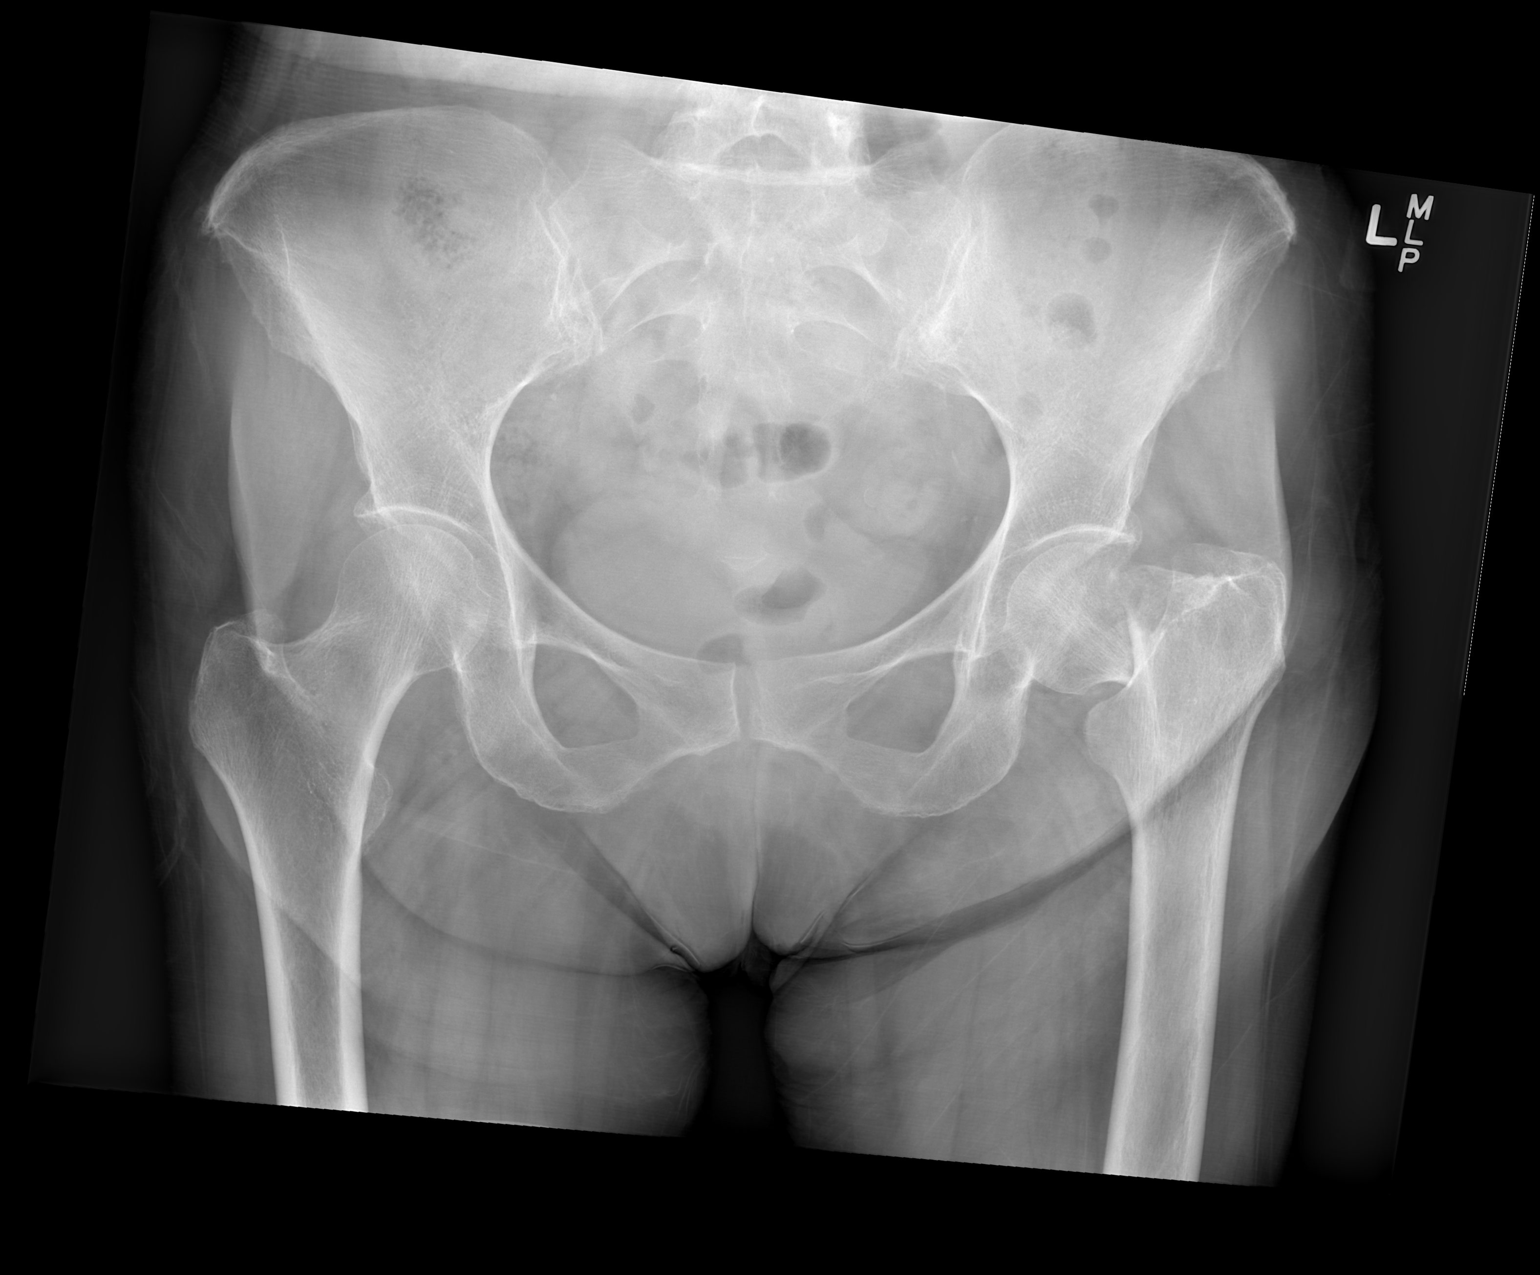

[x hip ap left]
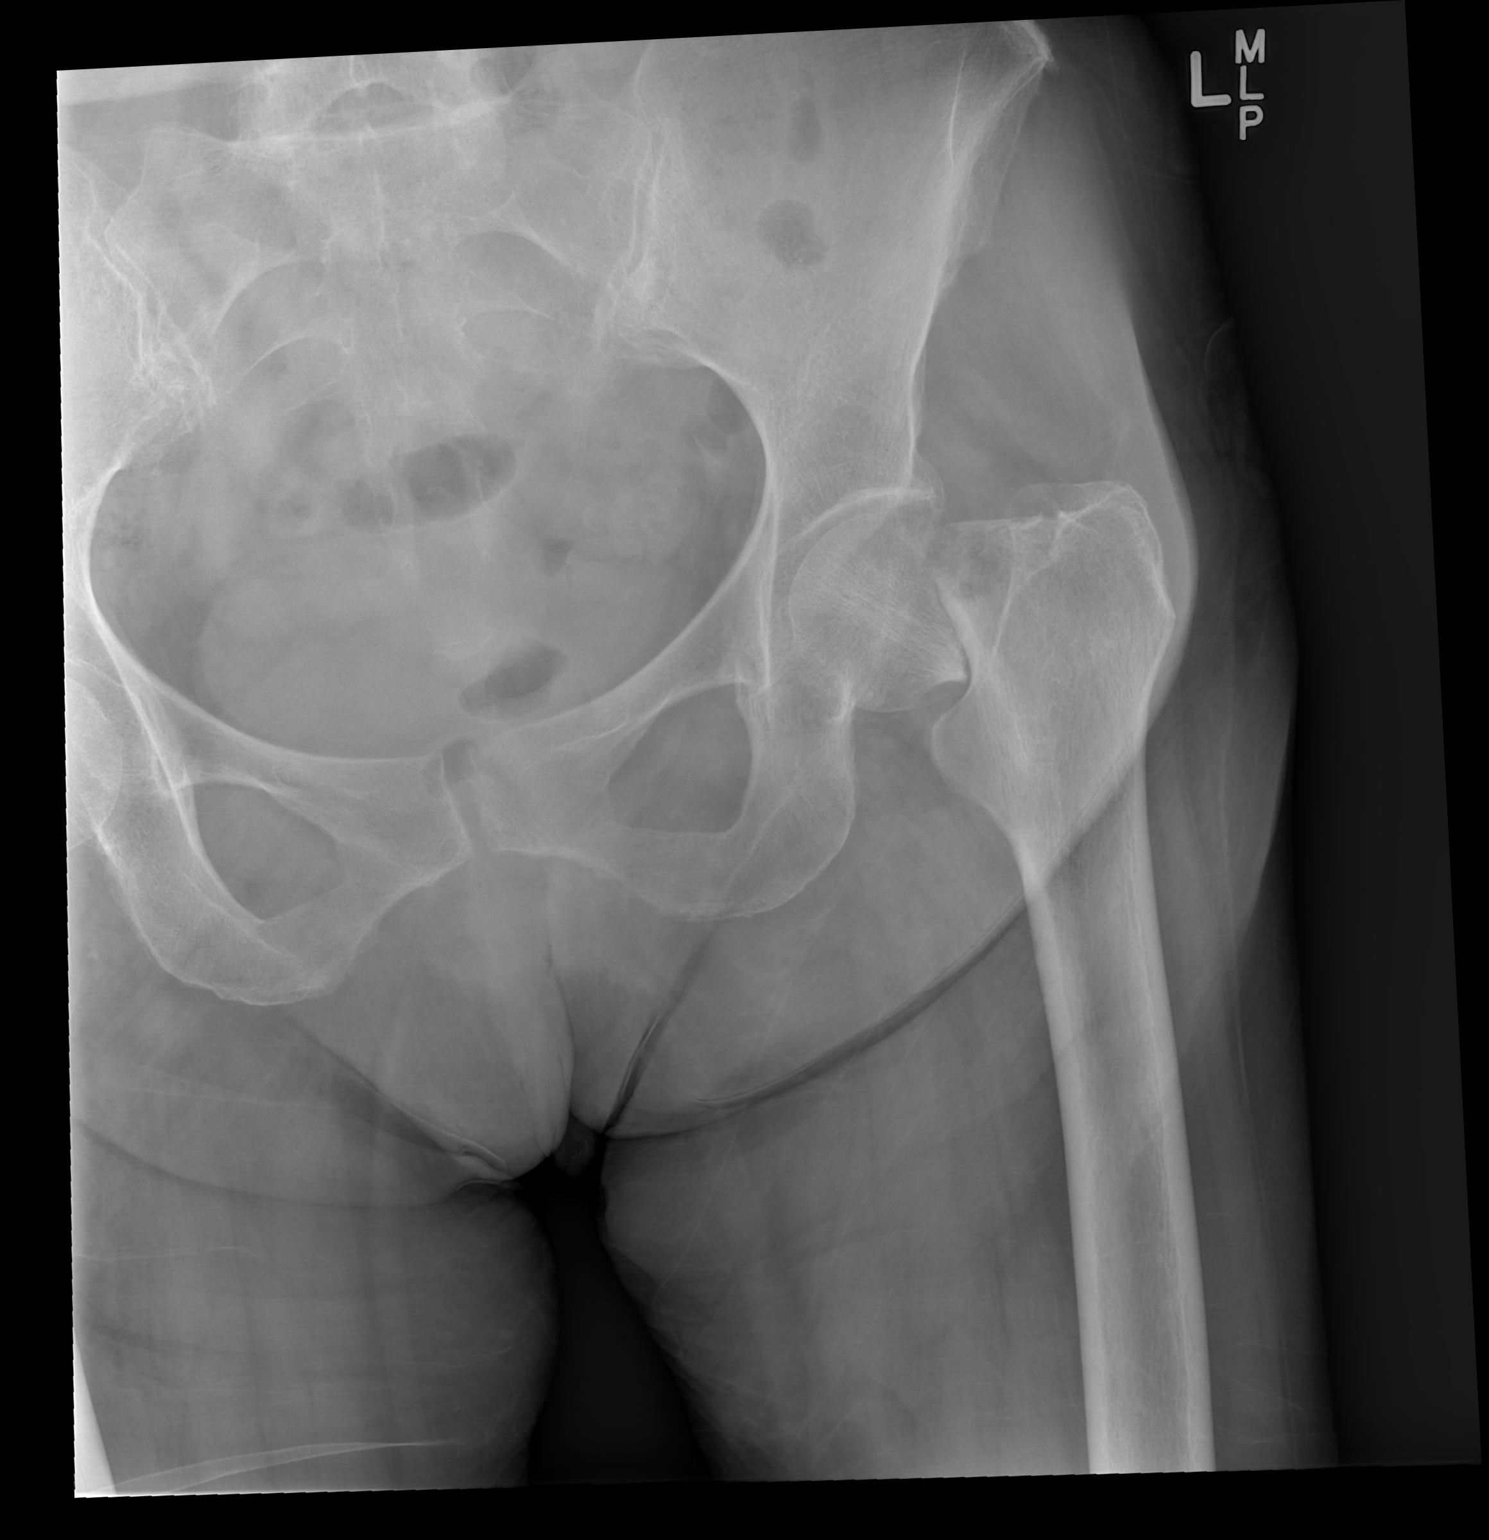

[3 of 3 positions shown; findings below may reference images not displayed]

FINDINGS: Comminuted impacted left femoral neck fracture with mild apex
lateral angulation and 15 mm superior displacement of the dominant
distal fracture fragment. No dislocation of the left femoral head at
the left hip joint. No additional fracture. No suspicious focal
osseous lesion. No radiopaque foreign body.
IMPRESSION: Comminuted impacted left femoral neck fracture as detailed.

## 2018-04-11 IMAGING — DX DG HIP (WITH OR WITHOUT PELVIS) 1V PORT*L*
2 series · 2 of 2 positions shown · non-contrast
Comparison: 02/12/2017.

CLINICAL DATA: Status post left hip replacement.

EXAM:
DG HIP (WITH OR WITHOUT PELVIS) 1V PORT LEFT

[pelvis ap]
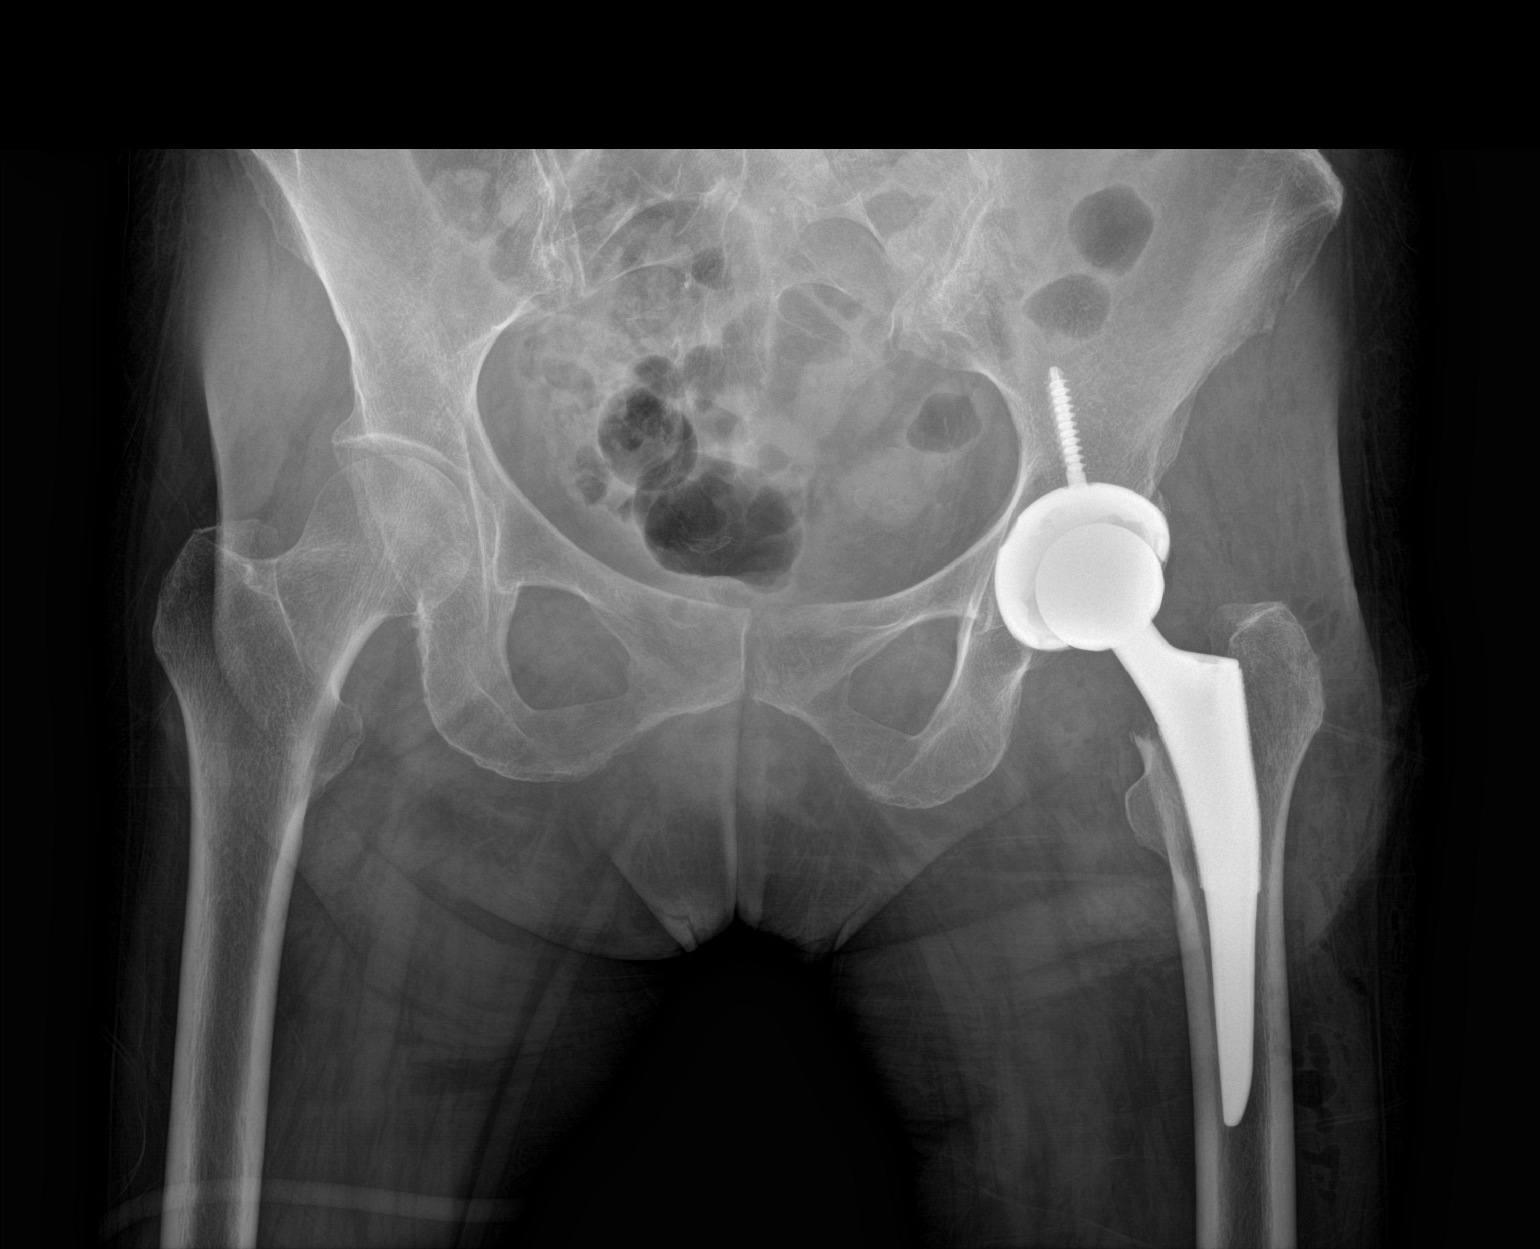

[hip lat]
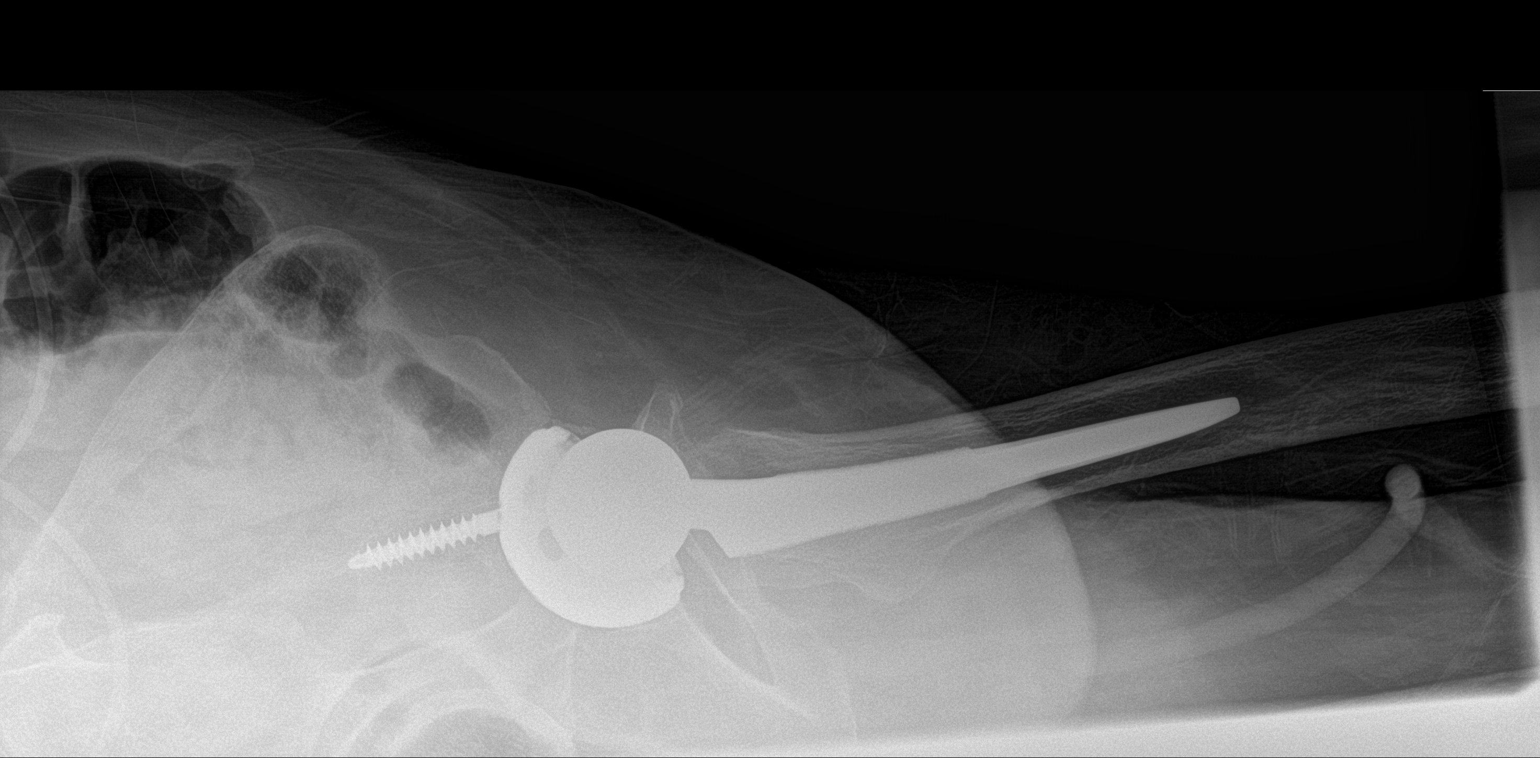

[2 of 2 positions shown; findings below may reference images not displayed]

FINDINGS: Interval left total hip prosthesis in satisfactory position and
alignment. No fracture or dislocation seen.
IMPRESSION: Satisfactory postoperative appearance of a left total hip
prosthesis.

## 2018-04-12 ENCOUNTER — Ambulatory Visit (INDEPENDENT_AMBULATORY_CARE_PROVIDER_SITE_OTHER): Payer: Medicare Other | Admitting: Physician Assistant

## 2018-04-12 ENCOUNTER — Encounter: Payer: Self-pay | Admitting: Physician Assistant

## 2018-04-12 VITALS — BP 150/80 | HR 72 | Ht 60.0 in | Wt 113.4 lb

## 2018-04-12 DIAGNOSIS — I10 Essential (primary) hypertension: Secondary | ICD-10-CM

## 2018-04-12 DIAGNOSIS — E785 Hyperlipidemia, unspecified: Secondary | ICD-10-CM | POA: Diagnosis not present

## 2018-04-12 NOTE — Patient Instructions (Signed)
No MEDICATION CHANGES     KEEP MONITORING YOUR BLOOD PRESSURE , KEEP READINGS AND BRING TO THE NEXT OFFICE APPOINTMENT     Your physician recommends that you schedule a follow-up appointment in [place] [address] ,2019

## 2018-04-12 NOTE — Progress Notes (Signed)
Cardiology Office Note   Date:  04/12/2018   ID:  Kara Campos, DOB 04-Nov-1936, MRN [id number]  PCP:  [name], MD  Cardiologist: Dr. Sallyanne Kuster, 03/17/2018 [name], PA-C 12/20/2017  Chief Complaint  Patient presents with  . Follow-up    4 weeks    History of Present Illness: Kara Campos is a 82 y.o. female with a history of HTN (intol losartan/HCTZ due to sulfa allergy), OA, DM, HLD.  03/17/2018 office visit, wt 115, edema felt likely amlodipine side effect, losartan 25 mg added at bedtime  Kara Campos presents for cardiology follow up.  She is having problems with swelling in L foot. She injured it during the time she was dealing with a broken hip. Her foot has been swelling since then. Her R foot swells a little, she thinks it is not much.  She has an appointment with a podiatrist to look at her left foot.  Her BP is up today, she feels it is higher in the am, better during the day. However, she has not taken it recently, does not have numbers.  She has not checked her blood pressure since she was started back on the losartan at bedtime.  She is watching the salt intake. She is aware there is hidden sodium in processed foods.  No orthopnea or PND.  Dyspnea on exertion is at baseline.  No chest pain.  No palpitations, no presyncope or syncope.   Past Medical History:  Diagnosis Date  . Arthritis   . Blood transfusion   . Cataract   . Diabetes mellitus    borderline  . Hemorrhoids   . Hyperlipidemia   . Osteoporosis     Past Surgical History:  Procedure Laterality Date  . TOTAL HIP ARTHROPLASTY Left 02/13/2017   Procedure: TOTAL HIP ARTHROPLASTY ANTERIOR APPROACH;  Surgeon: Paralee Cancel, MD;  Location: [facility];  Service: Orthopedics;  Laterality: Left;  [name] VAGINAL HYSTERECTOMY  1985    Current Outpatient Medications  Medication Sig Dispense Refill  . amLODipine (NORVASC) 2.5 MG tablet Take 1 tablet (2.5 mg total) by mouth every morning. 90 tablet 3   . Calcium Carbonate-Vitamin D (CALTRATE 600+D) 600-400 MG-UNIT per tablet Take 1 tablet by mouth daily.    [name] co-enzyme Q-10 30 MG capsule Take 50 mg by mouth once a week.    . fish oil-omega-3 fatty acids 1000 MG capsule Take 2 g by mouth daily.    [name] losartan (COZAAR) 25 MG tablet Take 1 tablet (25 mg total) by mouth every evening. 90 tablet 3  . Magnesium Citrate 100 MG TABS Take 100 mg by mouth at bedtime.    . nortriptyline (PAMELOR) 10 MG capsule Take 10 mg by mouth at bedtime.    . simvastatin (ZOCOR) 10 MG tablet Take 1 tablet by mouth. Once or twice a week    . timolol (BETIMOL) 0.5 % ophthalmic solution Place 2 drops into both eyes daily.      No current facility-administered medications for this visit.     Allergies:   Codeine; Macrobid [nitrofurantoin macrocrystal]; and Sulfa antibiotics    Social History:  The patient  reports that she has been smoking cigarettes.  She has been smoking about 0.50 packs per day. She has never used smokeless tobacco. She reports that she drinks alcohol. She reports that she does not use drugs.   Family History:  The patient's family history includes Ovarian cancer in her maternal grandmother.    ROS:  Please see the history of present illness. All other systems are reviewed and negative.    PHYSICAL EXAM: VS:  BP (!) 150/80   Pulse 72   Ht 5' (1.524 m)   Wt 113 lb 6.4 oz (51.4 kg)   SpO2 99%   BMI 22.15 kg/m  , BMI Body mass index is 22.15 kg/m. GEN: Well nourished, well developed, female in no acute distress  HEENT: normal for age  Neck: no JVD, no carotid bruit, no masses Cardiac: RRR; soft murmur, no rubs, or gallops Respiratory:  clear to auscultation bilaterally, normal work of breathing GI: soft, nontender, nondistended, + BS MS: no deformity or atrophy; L>R pedal edema; distal pulses are 2+ in all 4 extremities   Skin: warm and dry, no rash Neuro:  Strength and sensation are intact Psych: euthymic mood, full affect   EKG:   EKG is not ordered today.   Recent Labs: 12/07/2017: BUN 21; Creatinine, Ser 0.70; Hemoglobin 14.1; Platelets 270; Potassium 3.9; Sodium 137    Lipid Panel No results found for: CHOL, TRIG, HDL, CHOLHDL, VLDL, LDLCALC, LDLDIRECT   Wt Readings from Last 3 Encounters:  04/12/18 113 lb 6.4 oz (51.4 kg)  03/17/18 115 lb (52.2 kg)  12/20/17 113 lb 3.2 oz (51.3 kg)     Other studies Reviewed: Additional studies/ records that were reviewed today include: Office notes and testing.  ASSESSMENT AND PLAN:  1.  Hypertension: Her blood pressure is above goal today.  Compliance with amlodipine 2.5 mg a.m. and losartan 25 mg p.m. is encouraged.  I explained that many people had fewer side effects with small doses of multiple medications.  She is aware her blood pressure is above goal today, but feels that it runs lower at times at home.  However, she has no data. -She was given a blood pressure record sheet to take home and she is encouraged to check her blood pressure daily at different times of the day.   - This will help [place] determine if blood pressure medication changes are needed and if the timing of her current medication regimen is appropriate. -Follow-up in a couple of weeks.  It would be good to get her on a consistent long-term regimen that she could feel comfortable with.  2.  Hyperlipidemia: She is on coenzyme Q 10 and omega-3 fatty acids.  This is followed by her PCP, continue this.  The last labs we have are from 2014, her LDL was 102 at that time.  We will try to obtain more current labs   Current medicines are reviewed at length with the patient today.  The patient has concerns regarding medicines.  Concerns were addressed  The following changes have been made:  no change  Labs/ tests ordered today include:  No orders of the defined types were placed in this encounter.   Disposition:   FU with Dr. Sallyanne Kuster  Signed, [name], PA-C  04/12/2018 3:04 PM    [place] Phone: [phone]; Fax: [phone]  This note was written with the assistance of speech recognition software. Please excuse any transcriptional errors.

## 2018-04-13 DIAGNOSIS — L602 Onychogryphosis: Secondary | ICD-10-CM | POA: Diagnosis not present

## 2018-04-13 DIAGNOSIS — M79672 Pain in left foot: Secondary | ICD-10-CM | POA: Diagnosis not present

## 2018-04-13 DIAGNOSIS — M7672 Peroneal tendinitis, left leg: Secondary | ICD-10-CM | POA: Diagnosis not present

## 2018-04-13 NOTE — Progress Notes (Signed)
Thank you MCr 

## 2018-04-26 ENCOUNTER — Ambulatory Visit (INDEPENDENT_AMBULATORY_CARE_PROVIDER_SITE_OTHER): Payer: Medicare Other | Admitting: Physician Assistant

## 2018-04-26 ENCOUNTER — Encounter: Payer: Self-pay | Admitting: Physician Assistant

## 2018-04-26 VITALS — BP 154/80 | HR 64 | Ht 60.0 in | Wt 112.6 lb

## 2018-04-26 DIAGNOSIS — M79672 Pain in left foot: Secondary | ICD-10-CM | POA: Diagnosis not present

## 2018-04-26 DIAGNOSIS — L03032 Cellulitis of left toe: Secondary | ICD-10-CM | POA: Diagnosis not present

## 2018-04-26 DIAGNOSIS — M7672 Peroneal tendinitis, left leg: Secondary | ICD-10-CM | POA: Diagnosis not present

## 2018-04-26 DIAGNOSIS — R079 Chest pain, unspecified: Secondary | ICD-10-CM

## 2018-04-26 DIAGNOSIS — I10 Essential (primary) hypertension: Secondary | ICD-10-CM | POA: Diagnosis not present

## 2018-04-26 DIAGNOSIS — L602 Onychogryphosis: Secondary | ICD-10-CM | POA: Diagnosis not present

## 2018-04-26 DIAGNOSIS — E785 Hyperlipidemia, unspecified: Secondary | ICD-10-CM | POA: Diagnosis not present

## 2018-04-26 MED ORDER — AMLODIPINE BESYLATE 2.5 MG PO TABS: 2.5000 mg | ORAL_TABLET | Freq: Two times a day (BID) | ORAL | 6 refills | Status: DC

## 2018-04-26 NOTE — Patient Instructions (Signed)
Medication Instructions: Stop: Losartan 25 mg  Increase: Amlodipine 2.5 mg two times a day   If you need a refill on your cardiac medications before your next appointment, please call your pharmacy.    Follow-Up: Your physician wants you to follow-up in 5 months with Dr. Sallyanne Kuster   Special Instructions: Continue to check BP and bring recorded readings in office in 1 week.   Thank you for choosing [facility] at [facility]!!

## 2018-04-26 NOTE — Progress Notes (Addendum)
Cardiology Office Note   Date:  04/26/2018   ID:  [name], DOB 1936/04/20, MRN [id number]  PCP:  [name], MD  Cardiologist: Dr. Sallyanne Kuster, 03/17/2018 [name], PA-C 04/12/2018  Chief Complaint  Patient presents with  . Follow-up    History of Present Illness: Kara Campos is a 82 y.o. female with a history of  HTN (intol losartan/HCTZ due to sulfa allergy), OA, DM, HLD.  5/14 office visit, patient complaining swelling in her left foot>> podiatrist appointment pending.  Blood pressure above goal, on amlodipine 2.5 mg a.m. and losartan 25 mg p.m.  Blood pressure record sheet given to the patient and early follow-up arranged.  Current lipid profile requested.  Kara Campos presents for cardiology follow up.  The podiatrist found a broken bone in her foot>>she got an injection and the LE edema improved.   Her BP records are reviewed. Her am SBPs run higher, some >170. Afternoon and evening BPs are generally well controlled, SBP as low as 109.  Heart rates are in the mid 60s to high 70s or 80s.  No chest pain with exertion. She is taking it easy because of the foot injections and toe injury. However, she still has foot pain with walking so is not able to do very much.  She is able to stay busy.  She denies orthopnea, PND or any new dyspnea on exertion.  She continues to watch the salt in her food very carefully.    Past Medical History:  Diagnosis Date  . Arthritis   . Blood transfusion   . Cataract   . Diabetes mellitus    borderline  . Hemorrhoids   . Hyperlipidemia   . Osteoporosis     Past Surgical History:  Procedure Laterality Date  . TOTAL HIP ARTHROPLASTY Left 02/13/2017   Procedure: TOTAL HIP ARTHROPLASTY ANTERIOR APPROACH;  Surgeon: Paralee Cancel, MD;  Location: [facility];  Service: Orthopedics;  Laterality: Left;  [name] VAGINAL HYSTERECTOMY  1985    Current Outpatient Medications  Medication Sig Dispense Refill  . amLODipine (NORVASC) 2.5 MG  tablet Take 1 tablet (2.5 mg total) by mouth every morning. 90 tablet 3  . Calcium Carbonate-Vitamin D (CALTRATE 600+D) 600-400 MG-UNIT per tablet Take 1 tablet by mouth daily.    [name] co-enzyme Q-10 30 MG capsule Take 50 mg by mouth once a week.    . fish oil-omega-3 fatty acids 1000 MG capsule Take 2 g by mouth daily.    [name] losartan (COZAAR) 25 MG tablet Take 1 tablet (25 mg total) by mouth every evening. 90 tablet 3  . Magnesium Citrate 100 MG TABS Take 100 mg by mouth at bedtime.    . nortriptyline (PAMELOR) 10 MG capsule Take 10 mg by mouth at bedtime.    . simvastatin (ZOCOR) 10 MG tablet Take 1 tablet by mouth. Once or twice a week    . timolol (BETIMOL) 0.5 % ophthalmic solution Place 2 drops into both eyes daily.      No current facility-administered medications for this visit.     Allergies:   Codeine; Nitrofurantoin macrocrystal; and Sulfa antibiotics    Social History:  The patient  reports that she has been smoking cigarettes.  She has been smoking about 0.50 packs per day. She has never used smokeless tobacco. She reports that she drinks alcohol. She reports that she does not use drugs.   Family History:  The patient's family history includes Ovarian cancer in her maternal  grandmother.    ROS:  Please see the history of present illness. All other systems are reviewed and negative.    PHYSICAL EXAM: VS:  BP (!) 154/80   Pulse 64   Ht 5' (1.524 m)   Wt 112 lb 9.6 oz (51.1 kg)   BMI 21.99 kg/m  , BMI Body mass index is 21.99 kg/m. GEN: Well nourished, well developed, female in no acute distress  HEENT: normal for age  Neck: no JVD, no carotid bruit, no masses Cardiac: RRR; soft murmur, no rubs, or gallops Respiratory:  clear to auscultation bilaterally, normal work of breathing GI: soft, nontender, nondistended, + BS MS: no deformity or atrophy; no edema; distal pulses are 2+ in all 4 extremities   Skin: warm and dry, no rash Neuro:  Strength and sensation are  intact Psych: euthymic mood, full affect   EKG:  EKG is ordered today The ECG shows sinus rhythm, heart rate 64, mild left atrial enlargement, no acute ischemic changes.  Recent Labs: 12/07/2017: BUN 21; Creatinine, Ser 0.70; Hemoglobin 14.1; Platelets 270; Potassium 3.9; Sodium 137    Lipid Panel No results found for: CHOL, TRIG, HDL, CHOLHDL, VLDL, LDLCALC, LDLDIRECT   Wt Readings from Last 3 Encounters:  04/26/18 112 lb 9.6 oz (51.1 kg)  04/12/18 113 lb 6.4 oz (51.4 kg)  03/17/18 115 lb (52.2 kg)     Other studies Reviewed: Additional studies/ records that were reviewed today include: Office notes and testing.  ASSESSMENT AND PLAN:  1.  Hypertension: Amlodipine 2.5 mg/day is clearly not enough.  She is reluctant to take 5 mg at a time.  She also feels the losartan does not help her. -Option 1, increase the amlodipine to 2.5 mg twice daily and see how this is tolerated. -Option 2, continue the amlodipine and increase the losartan to 50 mg nightly and see if this helps her a.m. blood pressures. -Option 3, stop the losartan and add a low-dose of a new drug nightly such as Bystolic or metoprolol to help with a.m. blood pressure control *She prefers option 1 -She will increase the amlodipine to twice daily.  She was given a paper prescription for this because if it does not work she did not want to be sent electronically.  She is to continue tracking her blood pressure.  If her a.m. blood pressures are consistently less than 150, no med changes.  If the are greater than 150, but she does not tolerate twice daily amlodipine, add a low-dose of a beta-blocker or ACE inhibitor -Advised that we could do this by phone.  She will drop the blood pressure records by early next week and let us know her symptoms.  2.  Hyperlipidemia: Contact her PCP ancd get labs faxed over.  3.  Chest pain: Her symptoms have resolved, no further work-up   Current medicines are reviewed at length with the  patient today.  The patient has concerns regarding medicines.  The following changes have been made: Stop losartan, increase amlodipine to twice daily  Labs/ tests ordered today include:  No orders of the defined types were placed in this encounter.    Disposition:   FU with Dr. Sallyanne Kuster  Signed, [name], PA-C  04/26/2018 11:18 AM    [place] Phone: [phone]; Fax: [phone]  This note was written with the assistance of speech recognition software. Please excuse any transcriptional errors.

## 2018-04-28 NOTE — Progress Notes (Signed)
Thanks MCr 

## 2018-05-10 DIAGNOSIS — E559 Vitamin D deficiency, unspecified: Secondary | ICD-10-CM | POA: Diagnosis not present

## 2018-05-10 DIAGNOSIS — I10 Essential (primary) hypertension: Secondary | ICD-10-CM | POA: Diagnosis not present

## 2018-05-10 DIAGNOSIS — L03032 Cellulitis of left toe: Secondary | ICD-10-CM | POA: Diagnosis not present

## 2018-05-10 DIAGNOSIS — E7800 Pure hypercholesterolemia, unspecified: Secondary | ICD-10-CM | POA: Diagnosis not present

## 2018-05-10 DIAGNOSIS — R739 Hyperglycemia, unspecified: Secondary | ICD-10-CM | POA: Diagnosis not present

## 2018-05-12 ENCOUNTER — Telehealth: Payer: Self-pay | Admitting: Physician Assistant

## 2018-05-12 NOTE — Telephone Encounter (Signed)
Pt calling  Stated she dropped off some BP reading at the front desk last week for [facility] to look at, to determine if she still need to stay on her BP medication. Pt stated she haven't heard from anyone. Please advise pt.

## 2018-05-12 NOTE — Telephone Encounter (Signed)
Patient called stating that she dropped off her blood pressure readings last week for [name], PA. She was calling to get an update on whether or not anything has changed. Message routed.

## 2018-05-17 DIAGNOSIS — E559 Vitamin D deficiency, unspecified: Secondary | ICD-10-CM | POA: Diagnosis not present

## 2018-05-17 DIAGNOSIS — R350 Frequency of micturition: Secondary | ICD-10-CM | POA: Diagnosis not present

## 2018-05-17 DIAGNOSIS — Z0000 Encounter for general adult medical examination without abnormal findings: Secondary | ICD-10-CM | POA: Diagnosis not present

## 2018-05-17 DIAGNOSIS — R7301 Impaired fasting glucose: Secondary | ICD-10-CM | POA: Diagnosis not present

## 2018-05-17 NOTE — Telephone Encounter (Signed)
-----   Message from Ricci Barker, RN sent at 05/12/2018  5:06 PM EDT ----- I am out tomorrow. I have not seen any blood pressure readings. Hopefully Darden Dates will know something. Darden Dates there is a telephone encounter about this. Thanks! ----- Message ----- From: Reola Mosher Sent: 05/12/2018   4:42 PM To: Lonn Georgia, PA-C, Ricci Barker, RN, #  I am not sure where the BP readings are. I am in the hospital Friday, can someone please fax them to [phone]? Thanks

## 2018-05-17 NOTE — Telephone Encounter (Signed)
I don't have any blood pressure readings for Dr Lurline Del review.

## 2018-05-17 NOTE — Telephone Encounter (Signed)
I have not received any blood pressure readings; I have also checked Rhonda's box and nothing is in there I will ask Chelley if she received anything since Dr Sallyanne Kuster is her primary cardiologist.

## 2018-05-17 NOTE — Telephone Encounter (Signed)
Kara Campos I'm not sure where the readings are I have not seen them and im unaware of where they are.

## 2018-05-18 NOTE — Telephone Encounter (Signed)
Follow up    Patient calling for recommendation

## 2018-05-18 NOTE — Telephone Encounter (Signed)
Returned the call to the patient. She stated that she was recently taken off of the Losartan 25 mg and put on Norvasc 2.5 mg bid. She feels like she saw better results with the Norvasc 2.5 mg in the morning and Losartan 25 mg in the evening. The patient would like to be switched back to this regimen if possible or if she needs to come back for an appointment.   She stated that her blood pressure is still running around 701 systolic some mornings. She had recently dropped her blood pressure readings off at the front desk and was told that they would be put in [facility] box. As of yet, the pressure readings were not in the box. Message has been routed to the provider.

## 2018-05-19 ENCOUNTER — Telehealth: Payer: Self-pay | Admitting: Cardiovascular Disease

## 2018-05-19 DIAGNOSIS — H524 Presbyopia: Secondary | ICD-10-CM | POA: Diagnosis not present

## 2018-05-19 DIAGNOSIS — H26492 Other secondary cataract, left eye: Secondary | ICD-10-CM | POA: Diagnosis not present

## 2018-05-19 DIAGNOSIS — Z961 Presence of intraocular lens: Secondary | ICD-10-CM | POA: Diagnosis not present

## 2018-05-19 DIAGNOSIS — H401132 Primary open-angle glaucoma, bilateral, moderate stage: Secondary | ICD-10-CM | POA: Diagnosis not present

## 2018-05-19 NOTE — Telephone Encounter (Signed)
New Message:       Pt is calling and states she gave [place] her BP readings about two weeks ago and no one has called her to go over any of it.

## 2018-05-19 NOTE — Telephone Encounter (Signed)
Returned call to patient. She is concerned that Norvasc BID is causing ankle swelling. She dropped off BP readings about 2 weeks ago and per chart review, there are notes that the BP readings are unable to be located. Patient states that someone dropped the ball. Apologized for this. Per chart review, there was no phone note put in Epic when the BP readings were originally dropped off to track who obtained the paperwork and where it was placed. Patient is asking for call back before noon, explained it may not be possible to call her before then as I do not know where her BP readings are - she was explained this yesterday as well by Harriette Bouillon. She states she recalls from memory that her BP tends to be higher in the AM.   Will route to [place] PA for advice

## 2018-05-20 NOTE — Telephone Encounter (Signed)
If Norvasc 2.5 mg am and losartan 25 mg pm controls her BP, fine. If it does not, try to keep taking the Norvasc 2.5 mg am and at bedtime. Discuss further 06/29 Thanks

## 2018-05-20 NOTE — Telephone Encounter (Signed)
Patient made aware and verbalized her understanding. She is keeping a log of her pressures and will bring them with her next Wednesday.

## 2018-05-20 NOTE — Telephone Encounter (Signed)
Spoke to patient - appointment was made for 05/25/18- discuss question concerning blood pressure issues. Patient verbalized understanding and agree to appointment.

## 2018-05-23 DIAGNOSIS — L03032 Cellulitis of left toe: Secondary | ICD-10-CM | POA: Diagnosis not present

## 2018-05-25 ENCOUNTER — Encounter: Payer: Self-pay | Admitting: Physician Assistant

## 2018-05-25 ENCOUNTER — Ambulatory Visit (INDEPENDENT_AMBULATORY_CARE_PROVIDER_SITE_OTHER): Payer: Medicare Other | Admitting: Physician Assistant

## 2018-05-25 VITALS — BP 141/75 | HR 75 | Ht 61.0 in | Wt 113.0 lb

## 2018-05-25 DIAGNOSIS — I10 Essential (primary) hypertension: Secondary | ICD-10-CM

## 2018-05-25 DIAGNOSIS — R079 Chest pain, unspecified: Secondary | ICD-10-CM

## 2018-05-25 MED ORDER — AMLODIPINE BESYLATE 2.5 MG PO TABS: 2.5000 mg | ORAL_TABLET | Freq: Every morning | ORAL | 6 refills | Status: DC

## 2018-05-25 MED ORDER — LOSARTAN POTASSIUM 25 MG PO TABS: 25.0000 mg | ORAL_TABLET | Freq: Two times a day (BID) | ORAL | 6 refills | Status: DC

## 2018-05-25 NOTE — Patient Instructions (Signed)
Medication Instructions: Your physician recommends that you continue on your current medications as directed. Please refer to the Current Medication list given to you today.  Increase Losartan to 25 mg twice daily as directed.   Labwork: We will call you if lab work is needed.   Follow-Up: Your physician recommends that you schedule a follow-up appointment in: October with Dr. Sallyanne Kuster  If you need a refill on your cardiac medications before your next appointment, please call your pharmacy.

## 2018-05-25 NOTE — Progress Notes (Signed)
Cardiology Office Note   Date:  05/25/2018   ID:  Kara Campos, DOB June 11, 1936, MRN [id number]  PCP:  [name], MD  Cardiologist: Dr. Sallyanne Kuster, 03/17/2018 [name], PA-C 04/26/2018  Chief Complaint  Patient presents with  . Follow-up    unable to tolerate amlodipine BID, went back to losartan qpm and norvasc qam, bp still fluctuating    History of Present Illness: Kara Campos is a 82 y.o. female with a history of HTN (intol losartan/HCTZdue to sulfa allergy), OA, DM, HLD.  5/28 office visit, blood pressure above goal on amlodipine 2.5 mg a.m. and losartan 25 mg p.m., patient asked to take amlodipine 2.5 mg twice daily but has had problems with edema and she does not believe losartan 50 mg is not helpful  Kara Campos presents for cardiology follow up.   She is under a great deal of stress from various issues.   Amlodipine 2.5 mg bid gave her LE edema. She did not feel well, either.   She had surgery on her foot. She was under stress from that.  She is spacing out her evening medications. Takes the losartan 25 mg at bedtime.   Is willing to try taking a total of 50 mg of losartan.  She occasionally gets chest pain. She feels it is from the GERD/HH. Does not feel the CP is cardiac.   She is trying to stay active, is not having any exertional symptoms.   Past Medical History:  Diagnosis Date  . Arthritis   . Blood transfusion   . Cataract   . Diabetes mellitus    borderline  . Hemorrhoids   . Hyperlipidemia   . Osteoporosis     Past Surgical History:  Procedure Laterality Date  . TOTAL HIP ARTHROPLASTY Left 02/13/2017   Procedure: TOTAL HIP ARTHROPLASTY ANTERIOR APPROACH;  Surgeon: Paralee Cancel, MD;  Location: [facility];  Service: Orthopedics;  Laterality: Left;  [name] VAGINAL HYSTERECTOMY  1985    Current Outpatient Medications  Medication Sig Dispense Refill  . amLODipine (NORVASC) 2.5 MG tablet Take 2.5 mg by mouth every morning.    . Calcium  Carbonate-Vitamin D (CALTRATE 600+D) 600-400 MG-UNIT per tablet Take 1 tablet by mouth daily.    [name] co-enzyme Q-10 30 MG capsule Take 50 mg by mouth once a week.    . Cyanocobalamin (VITAMIN B-12 SL) Place 1 tablet under the tongue daily.    . fish oil-omega-3 fatty acids 1000 MG capsule Take 2 g by mouth daily.    [name] losartan (COZAAR) 25 MG tablet Take 25 mg by mouth every evening.    . Magnesium Citrate 100 MG TABS Take 100 mg by mouth at bedtime.    . nortriptyline (PAMELOR) 10 MG capsule Take 10 mg by mouth at bedtime.    . simvastatin (ZOCOR) 10 MG tablet Take 1 tablet by mouth. Once or twice a week    . timolol (BETIMOL) 0.5 % ophthalmic solution Place 2 drops into both eyes daily.      No current facility-administered medications for this visit.     Allergies:   Codeine; Nitrofurantoin macrocrystal; and Sulfa antibiotics    Social History:  The patient  reports that she has been smoking cigarettes.  She has been smoking about 0.50 packs per day. She has never used smokeless tobacco. She reports that she drinks alcohol. She reports that she does not use drugs.   Family History:  The patient's family history includes Ovarian cancer  in her maternal grandmother.    ROS:  Please see the history of present illness. All other systems are reviewed and negative.    PHYSICAL EXAM: VS:  BP (!) 141/75   Pulse 75   Ht 5\' 1"  (1.549 m)   Wt 113 lb (51.3 kg)   BMI 21.35 kg/m  , BMI Body mass index is 21.35 kg/m. GEN: Well nourished, well developed, female in no acute distress  HEENT: normal for age  Neck: no JVD, no carotid bruit, no masses Cardiac: RRR; soft murmur, no rubs, or gallops Respiratory:  clear to auscultation bilaterally, normal work of breathing GI: soft, nontender, nondistended, + BS MS: no deformity or atrophy; no edema; distal pulses are 2+ in all 4 extremities   Skin: warm and dry, no rash Neuro:  Strength and sensation are intact Psych: euthymic mood, full  affect   EKG:  EKG is not ordered today.   Recent Labs: 12/07/2017: BUN 21; Creatinine, Ser 0.70; Hemoglobin 14.1; Platelets 270; Potassium 3.9; Sodium 137    Lipid Panel No results found for: CHOL, TRIG, HDL, CHOLHDL, VLDL, LDLCALC, LDLDIRECT   Wt Readings from Last 3 Encounters:  05/25/18 113 lb (51.3 kg)  04/26/18 112 lb 9.6 oz (51.1 kg)  04/12/18 113 lb 6.4 oz (51.4 kg)     Other studies Reviewed: Additional studies/ records that were reviewed today include: Office notes, hospital records and testing.  ASSESSMENT AND PLAN:  1.  Hypertension: - She will try to get a total of 50 mg of losartan on board throughout the evening.  - Continue amlodipine 2.5 mg in the morning - I explained that if the losartan did not work, we will try different medication class to see if she tolerated it better. -She prefers to try to manage it with the losartan.  2.  Chest pain: Currently, she is not worried about it and feels it is related to GI issues. - No further testing indicated at this time   Current medicines are reviewed at length with the patient today.  The patient has concerns regarding medicines.  Concerns were addressed  The following changes have been made: Increase the losartan to a total of 50 mg p.m., dose to be split at the patient's discretion  Labs/ tests ordered today include:  No orders of the defined types were placed in this encounter.  Disposition:   FU with Dr. Sallyanne Kuster  Signed, [name], PA-C  05/25/2018 2:32 PM    [place] Phone: [phone]; Fax: [phone]  This note was written with the assistance of speech recognition software. Please excuse any transcriptional errors.

## 2018-05-26 DIAGNOSIS — H26491 Other secondary cataract, right eye: Secondary | ICD-10-CM | POA: Diagnosis not present

## 2018-05-26 NOTE — Progress Notes (Signed)
Thanks MCr 

## 2018-06-14 DIAGNOSIS — M79671 Pain in right foot: Secondary | ICD-10-CM | POA: Diagnosis not present

## 2018-06-14 DIAGNOSIS — M84374A Stress fracture, right foot, initial encounter for fracture: Secondary | ICD-10-CM | POA: Diagnosis not present

## 2018-06-15 ENCOUNTER — Telehealth: Payer: Self-pay

## 2018-06-15 DIAGNOSIS — M7672 Peroneal tendinitis, left leg: Secondary | ICD-10-CM | POA: Diagnosis not present

## 2018-06-15 DIAGNOSIS — M79672 Pain in left foot: Secondary | ICD-10-CM | POA: Diagnosis not present

## 2018-06-15 NOTE — Telephone Encounter (Signed)
Patient walked in and wants a call back. Patient has not received a call referring to lab work. Patient wants to know what and and when she is having them done. In patient's instructions on office visit on 05/25/18, patient was told "we will call you if lab work is needed." Patient was recently seen by [name] PA. Will see if she wants patient to have lab work, and then will call patient with advisement.

## 2018-06-22 NOTE — Telephone Encounter (Signed)
Please let her know that if she is taking losartan totaling 50 mg daily, we need to check a BMET in 3-4 weeks. If she is only able to tolerate 25 mg daily, lab work is not needed.  Thanks

## 2018-06-23 NOTE — Telephone Encounter (Signed)
Patient states she is taking 2 tabs daily at bedtime, but she waking up lathargic and lifeless. Would it be ok for her to take 1 losartan in the afternoon and one in the evening? Blood Pressure readings are doing good.

## 2018-06-23 NOTE — Telephone Encounter (Signed)
Yes, that would be fine.

## 2018-06-23 NOTE — Telephone Encounter (Signed)
Patient notified directly. Patient stated she has run out of vitamin D and she is unsure if this is a cause of her tiredness. I advised patient to take 1 tab of Losartan in the afternoon and in the evening as she requested from [facility] and to get some more Vitamin D since she has ran out. Advised patient if this does not work then she could contact the office. She also wanted to know about her kidneys. I advised her to contact her Kidney Doctor.

## 2018-07-11 DIAGNOSIS — M79672 Pain in left foot: Secondary | ICD-10-CM | POA: Insufficient documentation

## 2018-07-19 DIAGNOSIS — M79672 Pain in left foot: Secondary | ICD-10-CM | POA: Diagnosis not present

## 2018-07-20 ENCOUNTER — Telehealth: Payer: Self-pay | Admitting: Cardiovascular Disease

## 2018-07-20 DIAGNOSIS — Z79899 Other long term (current) drug therapy: Secondary | ICD-10-CM | POA: Diagnosis not present

## 2018-07-20 LAB — BASIC METABOLIC PANEL
BUN/Creatinine Ratio: 18 (ref 12–28)
BUN: 11 mg/dL (ref 8–27)
CALCIUM: 9.6 mg/dL (ref 8.7–10.3)
CO2: 21 mmol/L (ref 20–29)
CREATININE: 0.62 mg/dL (ref 0.57–1.00)
Chloride: 100 mmol/L (ref 96–106)
GFR, EST AFRICAN AMERICAN: 99 mL/min/{1.73_m2} (ref >59)
GFR, EST NON AFRICAN AMERICAN: 86 mL/min/{1.73_m2} (ref >59)
Glucose: 92 mg/dL (ref 65–99)
POTASSIUM: 4.4 mmol/L (ref 3.5–5.2)
Sodium: 138 mmol/L (ref 134–144)

## 2018-07-20 NOTE — Telephone Encounter (Signed)
° °  Patient has questions about getting labs, kidney function test.

## 2018-07-20 NOTE — Telephone Encounter (Signed)
I called patient regarding last telephone note, patient has continued to take 50 mg daily of losartan. I advised patient from previous note she needed to have a BMET completed. I placed the order, and patient is coming today to have it drawn, wanted to make you are.   Previous note from 07/17: Barrett, Evelene Croon, PA-C  to Ulice Brilliant T, CMA    2:32 PM  Note    Please let her know that if she is taking losartan totaling 50 mg daily, we need to check a BMET in 3-4 weeks. If she is only able to tolerate 25 mg daily, lab work is not needed.  Thanks

## 2018-07-26 DIAGNOSIS — M79672 Pain in left foot: Secondary | ICD-10-CM | POA: Diagnosis not present

## 2018-08-02 DIAGNOSIS — M79672 Pain in left foot: Secondary | ICD-10-CM | POA: Diagnosis not present

## 2018-08-11 DIAGNOSIS — M79672 Pain in left foot: Secondary | ICD-10-CM | POA: Diagnosis not present

## 2018-08-16 DIAGNOSIS — Z23 Encounter for immunization: Secondary | ICD-10-CM | POA: Diagnosis not present

## 2018-09-22 ENCOUNTER — Encounter: Payer: Self-pay | Admitting: Cardiovascular Disease

## 2018-09-22 ENCOUNTER — Ambulatory Visit (INDEPENDENT_AMBULATORY_CARE_PROVIDER_SITE_OTHER): Payer: Medicare Other | Admitting: Cardiovascular Disease

## 2018-09-22 VITALS — BP 166/84 | HR 65 | Ht 61.0 in | Wt 114.0 lb

## 2018-09-22 DIAGNOSIS — I10 Essential (primary) hypertension: Secondary | ICD-10-CM | POA: Diagnosis not present

## 2018-09-22 MED ORDER — BENAZEPRIL HCL 5 MG PO TABS: 5.0000 mg | ORAL_TABLET | Freq: Every day | ORAL | 3 refills | Status: DC

## 2018-09-22 NOTE — Patient Instructions (Signed)
Medication Instructions:  Dr Sallyanne Kuster has recommended making the following medication changes: 1. STOP Losartan 2. START Benazepril 5 mg - take 1 tablet daily  If you need a refill on your cardiac medications before your next appointment, please call your pharmacy.   Follow-Up: Dr Sallyanne Kuster recommends that you schedule a follow-up appointment in 3 months with [name], PA.  At [facility], you and your health needs are our priority.  As part of our continuing mission to provide you with exceptional heart care, we have created designated Provider Care Teams.  These Care Teams include your primary Cardiologist (physician) and Advanced Practice Providers (APPs -  Physician Assistants and Nurse Practitioners) who all work together to provide you with the care you need, when you need it. You will need a follow up appointment in 12 months.  Please call our office 2 months in advance to schedule this appointment.  You may see Sanda Klein, MD or one of the following Advanced Practice Providers on your designated Care Team: [place], [place] . Fabian Sharp, PA-C  Any Other Special Instructions Will Be Listed Below (If Applicable).  Your physician has requested that you regularly monitor your blood pressure at home. Please use the same machine to check your blood pressure daily. Keep a record of your blood pressures using the log sheet provided. In 3 weeks, please report your readings back to Dr C. You may use our online patient portal 'MyChart' or you can call the office to speak with a nurse.

## 2018-09-22 NOTE — Progress Notes (Signed)
Cardiology Office Note:    Date:  09/22/2018   ID:  Kara Campos, DOB [birth year], MRN [id number]  PCP:  [name], MD  Cardiologist:  Sanda Klein, MD   Referring MD: [name], MD   No chief complaint on file.   History of Present Illness:    Kara Campos is a 82 y.o. female with a hx of long-standing systemic hypertension, with several drug intolerances in the past.  She is worried by the constant recalls on losartan.  Wants to switch to a different agent.  She returns to discuss blood pressure management.  She checks her blood pressure at least once a day, often more than once a day.  She has noticed that her blood pressure is higher in the mornings and believes that the losartan does not offer sufficient 24-hour coverage.  She always takes losartan 25 mg in the evenings and amlodipine in the mornings.  Sometimes she will take an extra dose of losartan at noon time.  She finds that if she takes the entire 50 mg of losartan at the same time she has severe fatigue.  The blood pressure range on her recordings is from 109/50 5-1 160/78.  The median values are in the 120-130/70 range.  He has a history of lower extremity edema with higher doses of amlodipine.  She developed a severe rash when she took losartan-HCT although she tolerated monotherapy with losartan without side effects.  She has a history of sulfa antibiotic allergy and probably has thiazide cross-reactivity.  She did not tolerate beta-blockers in the past.  The patient specifically denies any chest pain at rest exertion, dyspnea at rest or with exertion, orthopnea, paroxysmal nocturnal dyspnea, syncope, palpitations, focal neurological deficits, intermittent claudication, lower extremity edema, unexplained weight gain, cough, hemoptysis or wheezing.  Describes long-standing problems with fatigue and has a chronic dry cough related to postnasal drip.   Past Medical History:  Diagnosis Date  . Arthritis   . Blood  transfusion   . Cataract   . Diabetes mellitus    borderline  . Hemorrhoids   . Hyperlipidemia   . Osteoporosis     Past Surgical History:  Procedure Laterality Date  . TOTAL HIP ARTHROPLASTY Left 02/13/2017   Procedure: TOTAL HIP ARTHROPLASTY ANTERIOR APPROACH;  Surgeon: Paralee Cancel, MD;  Location: [facility];  Service: Orthopedics;  Laterality: Left;  [name] VAGINAL HYSTERECTOMY  1985    Current Medications: Current Meds  Medication Sig  . amLODipine (NORVASC) 2.5 MG tablet Take 1 tablet (2.5 mg total) by mouth every morning.  [name] co-enzyme Q-10 30 MG capsule Take 50 mg by mouth once a week.  . Cyanocobalamin (VITAMIN B-12 SL) Place 1 tablet under the tongue daily.  . fish oil-omega-3 fatty acids 1000 MG capsule Take 2 g by mouth daily.  . nortriptyline (PAMELOR) 10 MG capsule Take 10 mg by mouth at bedtime.  . simvastatin (ZOCOR) 10 MG tablet Take 1 tablet by mouth. Once or twice a week  . timolol (BETIMOL) 0.5 % ophthalmic solution Place 2 drops into both eyes daily.   . [DISCONTINUED] losartan (COZAAR) 25 MG tablet Take 1 tablet (25 mg total) by mouth 2 (two) times daily. As directed.     Allergies:   Codeine; Nitrofurantoin macrocrystal; and Sulfa antibiotics   Social History   Socioeconomic History  . Marital status: Married    Spouse name: Not on file  . Number of children: Not on file  . Years of education: Not  on file  . Highest education level: Not on file  Occupational History  . Not on file  Social Needs  . Financial resource strain: Not on file  . Food insecurity:    Worry: Not on file    Inability: Not on file  . Transportation needs:    Medical: Not on file    Non-medical: Not on file  Tobacco Use  . Smoking status: Current Every Day Smoker    Packs/day: 0.50    Types: Cigarettes  . Smokeless tobacco: Never Used  Substance and Sexual Activity  . Alcohol use: Yes    Comment: Occas  . Drug use: No  . Sexual activity: Yes    Birth control/protection:  Post-menopausal, Surgical    Comment: HYST-1st intercourse 83 yo-1 partner  Lifestyle  . Physical activity:    Days per week: Not on file    Minutes per session: Not on file  . Stress: Not on file  Relationships  . Social connections:    Talks on phone: Not on file    Gets together: Not on file    Attends religious service: Not on file    Active member of club or organization: Not on file    Attends meetings of clubs or organizations: Not on file    Relationship status: Not on file  Other Topics Concern  . Not on file  Social History Narrative  . Not on file     Family History: The patient's family history includes Ovarian cancer in her maternal grandmother.  ROS:   Please see the history of present illness.    Other systems are reviewed and are negative  EKGs/Labs/Other Studies Reviewed:    The following studies were reviewed today: -  EKG:  EKG is ordered today.  Shows normal sinus rhythm, QTC 445 ms  Recent Labs: 12/07/2017: Hemoglobin 14.1; Platelets 270 07/20/2018: BUN 11; Creatinine, Ser 0.62; Potassium 4.4; Sodium 138  Recent Lipid Panel No results found for: CHOL, TRIG, HDL, CHOLHDL, VLDL, LDLCALC, LDLDIRECT  Physical Exam:    VS:  BP (!) 166/84 (BP Location: Right Arm, Patient Position: Sitting, Cuff Size: Normal)   Pulse 65   Ht 5\' 1"  (1.549 m)   Wt 114 lb (51.7 kg)   SpO2 99%   BMI 21.54 kg/m     Wt Readings from Last 3 Encounters:  09/22/18 114 lb (51.7 kg)  05/25/18 113 lb (51.3 kg)  04/26/18 112 lb 9.6 oz (51.1 kg)     General: Alert, oriented x3, no distress, lean, appears well Head: no evidence of trauma, PERRL, EOMI, no exophtalmos or lid lag, no myxedema, no xanthelasma; normal ears, nose and oropharynx Neck: normal jugular venous pulsations and no hepatojugular reflux; brisk carotid pulses without delay and no carotid bruits Chest: clear to auscultation, no signs of consolidation by percussion or palpation, normal fremitus, symmetrical and  full respiratory excursions Cardiovascular: normal position and quality of the apical impulse, regular rhythm, normal first and second heart sounds, no murmurs, rubs or gallops Abdomen: no tenderness or distention, no masses by palpation, no abnormal pulsatility or arterial bruits, normal bowel sounds, no hepatosplenomegaly Extremities: no clubbing, cyanosis or edema; 2+ radial, ulnar and brachial pulses bilaterally; 2+ right femoral, posterior tibial and dorsalis pedis pulses; 2+ left femoral, posterior tibial and dorsalis pedis pulses; no subclavian or femoral bruits Neurological: grossly nonfocal Psych: Normal mood and affect   ASSESSMENT:    1. Essential hypertension    PLAN:    In order  of problems listed above:   1. HTN: Overall blood pressure control is satisfactory in my opinion.  We discussed the fact that the blood pressure will varry a lot due to normal circadian rhythm and that her highest blood pressure will typically be early in the morning.  Having said that, losartan does have a relatively short half-life and may not be providing adequate coverage.  She wants to stop it because of the recalls.  She has never taken an ACE inhibitor.  We discussed the possibility of a cough as an annoying side effect which is not dangerous.  Her sister takes Lotensin and is happy with it.  We will try the same medication.  She needs a low-dose of amlodipine.  Note that she did not tolerate higher doses of amlodipine due to edema.  Appears to be allergic to thiazides.   Medication Adjustments/Labs and Tests Ordered: Current medicines are reviewed at length with the patient today.  Concerns regarding medicines are outlined above.  Orders Placed This Encounter  Procedures  . EKG 12-Lead   Meds ordered this encounter  Medications  . benazepril (LOTENSIN) 5 MG tablet    Sig: Take 1 tablet (5 mg total) by mouth daily.    Dispense:  90 tablet    Refill:  3    Patient Instructions  Medication  Instructions:  Dr Sallyanne Kuster has recommended making the following medication changes: 1. STOP Losartan 2. START Benazepril 5 mg - take 1 tablet daily  If you need a refill on your cardiac medications before your next appointment, please call your pharmacy.   Follow-Up: Dr Sallyanne Kuster recommends that you schedule a follow-up appointment in 3 months with [name], PA.  At [facility], you and your health needs are our priority.  As part of our continuing mission to provide you with exceptional heart care, we have created designated Provider Care Teams.  These Care Teams include your primary Cardiologist (physician) and Advanced Practice Providers (APPs -  Physician Assistants and Nurse Practitioners) who all work together to provide you with the care you need, when you need it. You will need a follow up appointment in 12 months.  Please call our office 2 months in advance to schedule this appointment.  You may see Sanda Klein, MD or one of the following Advanced Practice Providers on your designated Care Team: [place], [place] . Fabian Sharp, PA-C  Any Other Special Instructions Will Be Listed Below (If Applicable).  Your physician has requested that you regularly monitor your blood pressure at home. Please use the same machine to check your blood pressure daily. Keep a record of your blood pressures using the log sheet provided. In 3 weeks, please report your readings back to Dr C. You may use our online patient portal 'MyChart' or you can call the office to speak with a nurse.     Signed, Sanda Klein, MD  09/22/2018 7:05 PM    [place]

## 2018-10-11 ENCOUNTER — Telehealth: Payer: Self-pay | Admitting: Cardiovascular Disease

## 2018-10-11 NOTE — Telephone Encounter (Signed)
  Pt c/o medication issue:  1. Name of Medication: benazepril (LOTENSIN) 5 MG tablet  2. How are you currently taking this medication (dosage and times per day)? Take 1 tablet (5 mg total) by mouth daily  3. Are you having a reaction (difficulty breathing--STAT)?  No  4. What is your medication issue? Patient is not sure this medication is working for her and she has some questions

## 2018-10-11 NOTE — Telephone Encounter (Signed)
Returned call to patient-patient states she saw Dr. Sallyanne Kuster 10/24 and was started on benazepril.   She states she started taking this on 10/26 but she does not believe this is working for her.    She states since she started this medication she wakes up every night with her heart racing and is unable to get comfortable and rest after this.    She has not taken her BP or HR during an episode as she doesn't want to get up during this as she feels dizzy.    She states her BP in the morning is elevated.  It was 169/86 and HR 90 which is elevated for her.  She states she is concerned about the side effects from this medication and is not comfortable increasing her dose.    She is requesting to be seen.   appt scheduled with Arnold Long DNP tomorrow at 1:30 pm.  Patient aware to bring BP readings.

## 2018-10-12 ENCOUNTER — Ambulatory Visit (INDEPENDENT_AMBULATORY_CARE_PROVIDER_SITE_OTHER): Payer: Medicare Other | Admitting: Adult Health

## 2018-10-12 ENCOUNTER — Encounter: Payer: Self-pay | Admitting: Adult Health

## 2018-10-12 VITALS — BP 150/82 | HR 77 | Ht 61.0 in | Wt 113.6 lb

## 2018-10-12 DIAGNOSIS — I10 Essential (primary) hypertension: Secondary | ICD-10-CM

## 2018-10-12 DIAGNOSIS — F418 Other specified anxiety disorders: Secondary | ICD-10-CM

## 2018-10-12 MED ORDER — HYDRALAZINE HCL 25 MG PO TABS: 25.0000 mg | ORAL_TABLET | Freq: Two times a day (BID) | ORAL | 3 refills | Status: DC

## 2018-10-12 NOTE — Patient Instructions (Addendum)
Medication Instructions:  STOP BENAZEPRIL  START HYDRALAZINE 25MG -FOR ONE WEEK TAKE 1/2 TAB TWICE DAILY  TAKE YOUR AMLODIPINE AT [place]  If you need a refill on your cardiac medications before your next appointment, please call your pharmacy.  Special Instructions: CALL us AND LET us KNOW HOW THE MEDICATION IS WORKING IN 1 WEEK   Follow-Up: You will need a follow up appointment in 2 WEEKS.   You may see  Jory Sims, DNP, ANP or one of the following Advanced Practice Providers on your designated Care Team:    At [facility], you and your health needs are our priority.  As part of our continuing mission to provide you with exceptional heart care, we have created designated Provider Care Teams.  These Care Teams include your primary Cardiologist (physician) and Advanced Practice Providers (APPs -  Physician Assistants and Nurse Practitioners) who all work together to provide you with the care you need, when you need it.  Labwork: If you have labs (blood work) drawn today and your tests are completely normal, you will receive your results only by: [name] MyChart Message (if you have MyChart) OR . A paper copy in the mail If you have any lab test that is abnormal or we need to change your treatment, we will call you to review the results.  Thank you for choosing [facility] at [facility]!!

## 2018-10-12 NOTE — Progress Notes (Signed)
Cardiology Office Note   Date:  10/12/2018   ID:  Kara Campos, DOB 12-21-1935, MRN [id number]  PCP:  [name], MD  Cardiologist:  Dr. Sallyanne Kuster.  Chief Complaint  Patient presents with  . Hypertension     History of Present Illness: Kara Campos is a 82 y.o. female who presents for ongoing assessment and management of HTN.She is intolerant to multiple medications.  She no longer wishes to take benazepril because she states that it is causing her to wake up at night with her heart racing This is new since taking this medication. She brings with her a copy of her BP's at home they are higher  Between 163/76 - 170/83 in the am  Get better after taking amlodipine and benazepril by the afternoon, but rise again in the evening to am BP recordings.   She also admits to a lot of anxiety with a grown child who is living with them. She has not talked to her PCP about assistance with her anxiety and remains on antidepressant therapy.      Past Medical History:  Diagnosis Date  . Arthritis   . Blood transfusion   . Cataract   . Diabetes mellitus    borderline  . Hemorrhoids   . Hyperlipidemia   . Osteoporosis     Past Surgical History:  Procedure Laterality Date  . TOTAL HIP ARTHROPLASTY Left 02/13/2017   Procedure: TOTAL HIP ARTHROPLASTY ANTERIOR APPROACH;  Surgeon: Paralee Cancel, MD;  Location: [facility];  Service: Orthopedics;  Laterality: Left;  [name] VAGINAL HYSTERECTOMY  1985     Current Outpatient Medications  Medication Sig Dispense Refill  . amLODipine (NORVASC) 2.5 MG tablet Take 1 tablet (2.5 mg total) by mouth every morning. 30 tablet 6  . co-enzyme Q-10 30 MG capsule Take 50 mg by mouth once a week.    . Cyanocobalamin (VITAMIN B-12 SL) Place 1 tablet under the tongue daily.    . fish oil-omega-3 fatty acids 1000 MG capsule Take 2 g by mouth daily.    . nortriptyline (PAMELOR) 10 MG capsule Take 10 mg by mouth at bedtime.    . simvastatin (ZOCOR) 10 MG tablet Take 1 tablet  by mouth. Once or twice a week    . timolol (BETIMOL) 0.5 % ophthalmic solution Place 2 drops into both eyes daily.     . hydrALAZINE (APRESOLINE) 25 MG tablet Take 1 tablet (25 mg total) by mouth 2 (two) times daily. 60 tablet 3   No current facility-administered medications for this visit.     Allergies:   Codeine; Nitrofurantoin macrocrystal; and Sulfa antibiotics    Social History:  The patient  reports that she has been smoking cigarettes. She has been smoking about 0.50 packs per day. She has never used smokeless tobacco. She reports that she drinks alcohol. She reports that she does not use drugs.   Family History:  The patient's family history includes Ovarian cancer in her maternal grandmother.    ROS: All other systems are reviewed and negative. Unless otherwise mentioned in H&P    PHYSICAL EXAM: VS:  BP (!) 150/82   Pulse 77   Ht 5\' 1"  (1.549 m)   Wt 113 lb 9.6 oz (51.5 kg)   SpO2 98%   BMI 21.46 kg/m  , BMI Body mass index is 21.46 kg/m. GEN: Well nourished, well developed, in no acute distress HEENT: normal Neck: no JVD, carotid bruits, or masses Cardiac: RRR; no murmurs, rubs, or gallops,no  edema  Respiratory:  Clear to auscultation bilaterally, normal work of breathing GI: soft, nontender, nondistended, + BS MS: no deformity or atrophy Skin: warm and dry, no rash Neuro:  Strength and sensation are intact Psych: euthymic mood, full affect   EKG:  NSR rate of  77 bpm.   Recent Labs: 12/07/2017: Hemoglobin 14.1; Platelets 270 07/20/2018: BUN 11; Creatinine, Ser 0.62; Potassium 4.4; Sodium 138    Lipid Panel No results found for: CHOL, TRIG, HDL, CHOLHDL, VLDL, LDLCALC, LDLDIRECT    Wt Readings from Last 3 Encounters:  10/12/18 113 lb 9.6 oz (51.5 kg)  09/22/18 114 lb (51.7 kg)  05/25/18 113 lb (51.3 kg)      Other studies Reviewed: None available   ASSESSMENT AND PLAN:  1. Difficult to control hypertension: She has multiple intolerances to  antihypertensive therapy. I think this is multifactorial with anxiety over medications, anxiety in personal life, and actual hypertension. We have talked extensively about other choices. We will try hydralazine, 25 mg BID. I have asked her to take 12.5 mg BID at first and take the amlodipine at noon. She will keep up with her BP as usual. Then after a week she will increase to 25 mg BID if she is tolerating it and BP is not well controlled/ She will see [place] again for a nurse visit in two weeks for recheck.    2. Anxiety and Depression: I have asked her to talk to her PCP about medications to help her with anxiety.    Current medicines are reviewed at length with the patient today.    Labs/ tests ordered today include:None  Phill Myron. [place] Pugh, ANP, AACC   10/12/2018 2:02 PM    [address] Office [phone] Fax [phone]

## 2018-10-13 NOTE — Progress Notes (Signed)
Agreed. I had a similar impression MCr

## 2018-10-14 DIAGNOSIS — Z961 Presence of intraocular lens: Secondary | ICD-10-CM | POA: Diagnosis not present

## 2018-10-14 DIAGNOSIS — H401132 Primary open-angle glaucoma, bilateral, moderate stage: Secondary | ICD-10-CM | POA: Diagnosis not present

## 2018-10-14 DIAGNOSIS — H04123 Dry eye syndrome of bilateral lacrimal glands: Secondary | ICD-10-CM | POA: Diagnosis not present

## 2018-10-14 DIAGNOSIS — H16223 Keratoconjunctivitis sicca, not specified as Sjogren's, bilateral: Secondary | ICD-10-CM | POA: Diagnosis not present

## 2018-10-19 ENCOUNTER — Telehealth: Payer: Self-pay | Admitting: Adult Health

## 2018-10-19 NOTE — Telephone Encounter (Signed)
Please have her take hydralazine 25 mg BID now as she is tolerating the lower dose of the hydralazine 12.5 mg  Continue to take benazepril and record BP's.

## 2018-10-19 NOTE — Telephone Encounter (Signed)
Spoke to patient. Patient states she was suppose to call back with  Blood pressure reading since stating    Hydralazine 12.5 mg twice a day   blood pressure -- 10-14-18  166/84 -- 86                                3 pm 143/78           10-16-18  141/81 -                            4 pm    136/70            10-17-18   153/72       3 pm    146/70              10-18-18   168/81       6:30 am Patient  States she may have a little stomach distress but nothing like when she took  She thinks the benazepril.  Patient aware will defer to [place]. Patient states can leave a detail message on answer machine.

## 2018-10-19 NOTE — Telephone Encounter (Signed)
New Message        Patient is calling today for a call back concerning medication change

## 2018-10-19 NOTE — Telephone Encounter (Addendum)
PER PATIENT REQUEST,LEFT MESSAGE TO INCREASE  HYDRALAZINE  25 MG  TWICE A DAY CONTINUE TO DO BLOOD PRESSURE READING. Patient is not taking benazepril.  Clarification given Per lawrence, do not restart benazepril

## 2018-10-21 ENCOUNTER — Telehealth: Payer: Self-pay | Admitting: Adult Health

## 2018-10-21 NOTE — Telephone Encounter (Signed)
Patient returned call, advised of note below. Patient verbalized understanding, will keep appointment next week.

## 2018-10-21 NOTE — Telephone Encounter (Signed)
I called patient who states since her increase of hydralazine on 10/19/18 to 25 mg BID. Patient states this morning her BP was 174/85, and since the 20th her BP has been 168/81, 174/79 and then the one for this morning. She states she has felt bad, so she has only been taking 1/2 tablet of hydralazine in the afternoons, and taking 25 mg at bedtime. I advised patient I would speak with PharmD on this, if they believed it to be okay to increase to the 25 mg BID as instructed.   Patient does have appointment on 11/26. Thanks!

## 2018-10-21 NOTE — Telephone Encounter (Signed)
Called patient with recommendations from PharmD.  LMTCB.

## 2018-10-21 NOTE — Telephone Encounter (Signed)
Blood pressure is not significantly changed and definitely above desired goal of 130/80.  She" felt bad" = what are the exact symptoms? Expected to feel little bit tired for few days if BP decreasing as desired but ,body should adapt to lowe blood pressure after 2 weeks.   Recommendations:  1. Resume therapy as recommended by Arnold Long NP. Hydralazine 25mg  twice daily and amlodipine 2.5mg  at noon.  2. Monitor BP and HR daily and keep records  3. Call back if new symptoms noted. If patient unable to tolerate hydralazine 25mg  BID , will need to increase amlodipine to 5mg  daily

## 2018-10-21 NOTE — Telephone Encounter (Signed)
New Message         Pt c/o medication issue:  1. Name of Medication: Hydralazine   2. How are you currently taking this medication (dosage and times per day)? 2x a day  3. Are you having a reaction (difficulty breathing--STAT)? Having rapid heart beat  4. What is your medication issue? Rapid heart beat 174/85 @ 6;00 AM

## 2018-10-23 NOTE — Progress Notes (Signed)
Cardiology Office Note   Date:  10/25/2018   ID:  GABRIELA IRIGOYEN, DOB [birth year], MRN [id number]  PCP:  [name], MD  Cardiologist: Dr. Sallyanne Kuster  Chief Complaint  Patient presents with  . Follow-up    2 weeks.  . Chest Pain    Associated with the Hydralazine patient says.  . Shortness of Breath    Associated with the Hydralazine patient says.     History of Present Illness: ISHITA Campos is a 82 y.o. female who presents for ongoing assessment and management of hypertension, who is intolerant to multiple medications.  She had been on benazepril recently she states that is causing her to stay awake at night and cause her heart to race.  On last office visit she remained hypertensive but it does get better after taking amlodipine and benazepril.  The patient also has a lot of family stress and is suffering from depression.  On last office visit it was felt that her blood pressure was difficult to control due to multifactorial issues concerning anxiety overmedication, personal life struggles, and baseline hypertension which has been difficult to control.  She was started on hydralazine 12.5 mg twice daily and to take amlodipine dose at noon.  If she tolerated the amlodipine at 12.5 and blood pressure was not well controlled she was to increase it to 25 mg twice daily.  She was advised to follow-up with PCP concerning antidepressant and anxiety medications.  She called our office on 10/21/2018 due to elevated blood pressure of 174/85.  She admitted that she was only taking 12.5 mg of hydralazine instead of 25 mg of hydralazine.  She was advised to take medications as directed.  She is here for follow-up for blood pressure response to medications.  Today she comes having changed her medications again on her own. She stopped taking hydralazine because she stated that her BP went up and causes her HR to race at night time. She has had this happen with benazepril in the past. She brings with her a  record of her BP's which she reads to me. She decided to start taking losartan 25 mg at HS, a medication she had been on in the past, but stopped to due concerns about the recall.  She states that her BP got better and she "slept like a baby."   She is  Now concerned about taking the losartan because of the recall and wonders if there is an equivalent that she can take instead.  She is very anxious about medication and her responses to them.   Past Medical History:  Diagnosis Date  . Arthritis   . Blood transfusion   . Cataract   . Diabetes mellitus    borderline  . Hemorrhoids   . Hyperlipidemia   . Osteoporosis     Past Surgical History:  Procedure Laterality Date  . TOTAL HIP ARTHROPLASTY Left 02/13/2017   Procedure: TOTAL HIP ARTHROPLASTY ANTERIOR APPROACH;  Surgeon: Paralee Cancel, MD;  Location: [facility];  Service: Orthopedics;  Laterality: Left;  [name] VAGINAL HYSTERECTOMY  1985     Current Outpatient Medications  Medication Sig Dispense Refill  . amLODipine (NORVASC) 2.5 MG tablet Take 1 tablet (2.5 mg total) by mouth every morning. 30 tablet 6  . co-enzyme Q-10 30 MG capsule Take 50 mg by mouth once a week.    . Cyanocobalamin (VITAMIN B-12 SL) Place 1 tablet under the tongue daily.    . fish oil-omega-3 fatty acids 1000 MG capsule  Take 2 g by mouth daily.    . nortriptyline (PAMELOR) 10 MG capsule Take 10 mg by mouth at bedtime.    . simvastatin (ZOCOR) 10 MG tablet Take 1 tablet by mouth as needed. Once or twice a week    . timolol (BETIMOL) 0.5 % ophthalmic solution Place 2 drops into both eyes daily.     . irbesartan (AVAPRO) 75 MG tablet Take 1 tablet (75 mg total) by mouth at bedtime. 30 tablet 11   No current facility-administered medications for this visit.     Allergies:   Codeine; Nitrofurantoin macrocrystal; and Sulfa antibiotics    Social History:  The patient  reports that she has been smoking cigarettes. She has been smoking about 0.50 packs per day. She has  never used smokeless tobacco. She reports that she drinks alcohol. She reports that she does not use drugs.   Family History:  The patient's family history includes Ovarian cancer in her maternal grandmother.    ROS: All other systems are reviewed and negative. Unless otherwise mentioned in H&P    PHYSICAL EXAM: VS:  BP 136/78 (BP Location: Left Arm, Patient Position: Sitting, Cuff Size: Normal)   Pulse 71   Ht 5\' 1"  (1.549 m)   Wt 114 lb (51.7 kg)   BMI 21.54 kg/m  , BMI Body mass index is 21.54 kg/m. GEN: Well nourished, well developed, in no acute distress HEENT: normal Neck: no JVD, carotid bruits, or masses Cardiac: RRR; no murmurs, rubs, or gallops,no edema  Respiratory:  Clear to auscultation bilaterally, normal work of breathing GI: soft, nontender, nondistended, + BS MS: no deformity or atrophy Skin: warm and dry, no rash Neuro:  Strength and sensation are intact Psych: euthymic mood, full affect   EKG:  Not completed on this office visit.   Recent Labs: 12/07/2017: Hemoglobin 14.1; Platelets 270 07/20/2018: BUN 11; Creatinine, Ser 0.62; Potassium 4.4; Sodium 138    Lipid Panel No results found for: CHOL, TRIG, HDL, CHOLHDL, VLDL, LDLCALC, LDLDIRECT    Wt Readings from Last 3 Encounters:  10/25/18 114 lb (51.7 kg)  10/12/18 113 lb 9.6 oz (51.5 kg)  09/22/18 114 lb (51.7 kg)     ASSESSMENT AND PLAN:  1. Hypertension: She has been manipulating her medications at home with stopping and starting different doses and substituting current medications for different prior medications. She states that her BP has gotten better since re-starting losartan 25 mg at HS and taking amlodipine at am. She continues to have some palpitations at night sometimes.  She wants to stop the losartan due to the recall and try an equivalent. I will start her on irbesartan 75 mg as this is equivalent to 25 mg losartan dose. She will continue amlodipine as directed.   2. Anxiety over health  and medications: I have advised her to see PCP for antianxiety medications, to take especially at [facility] when she notices this most. She has an appointment in December 2019. I have strongly encouraged her to keep this appointment.    Current medicines are reviewed at length with the patient today.    Labs/ tests ordered today include: None   Phill Myron. West Pugh, ANP, [facility]   10/25/2018 10:32 AM    [place] [place] [address] Office [phone] Fax [phone]

## 2018-10-25 ENCOUNTER — Encounter: Payer: Self-pay | Admitting: Adult Health

## 2018-10-25 ENCOUNTER — Ambulatory Visit (INDEPENDENT_AMBULATORY_CARE_PROVIDER_SITE_OTHER): Payer: Medicare Other | Admitting: Adult Health

## 2018-10-25 VITALS — BP 136/78 | HR 71 | Ht 61.0 in | Wt 114.0 lb

## 2018-10-25 DIAGNOSIS — F418 Other specified anxiety disorders: Secondary | ICD-10-CM | POA: Diagnosis not present

## 2018-10-25 DIAGNOSIS — I10 Essential (primary) hypertension: Secondary | ICD-10-CM | POA: Diagnosis not present

## 2018-10-25 MED ORDER — IRBESARTAN 75 MG PO TABS: 75.0000 mg | ORAL_TABLET | Freq: Every day | ORAL | 11 refills | Status: DC

## 2018-10-25 NOTE — Patient Instructions (Signed)
Follow-Up: You will need a follow up appointment [place], PA-C  Medication Instructions:  START IRBESARTAN 75MG  AT BEDTIME  If you need a refill on your cardiac medications before your next appointment, please call your pharmacy.  Labwork: If you have labs (blood work) drawn today and your tests are completely normal, you will receive your results ONLY by: . MyChart Message (if you have MyChart) -OR- . A paper copy in the mail At [facility], you and your health needs are our priority.  As part of our continuing mission to provide you with exceptional heart care, we have created designated Provider Care Teams.  These Care Teams include your primary Cardiologist (physician) and Advanced Practice Providers (APPs -  Physician Assistants and Nurse Practitioners) who all work together to provide you with the care you need, when you need it.  Thank you for choosing [facility] at [facility]!!

## 2018-10-25 NOTE — Progress Notes (Signed)
We may never find something that she is comfortable with, but we need to keep trying.Marland KitchenMarland Kitchen

## 2018-10-31 DIAGNOSIS — R7301 Impaired fasting glucose: Secondary | ICD-10-CM | POA: Diagnosis not present

## 2018-10-31 DIAGNOSIS — I10 Essential (primary) hypertension: Secondary | ICD-10-CM | POA: Diagnosis not present

## 2018-10-31 DIAGNOSIS — E7800 Pure hypercholesterolemia, unspecified: Secondary | ICD-10-CM | POA: Diagnosis not present

## 2018-10-31 DIAGNOSIS — E559 Vitamin D deficiency, unspecified: Secondary | ICD-10-CM | POA: Diagnosis not present

## 2018-11-03 DIAGNOSIS — R29898 Other symptoms and signs involving the musculoskeletal system: Secondary | ICD-10-CM | POA: Diagnosis not present

## 2018-11-03 DIAGNOSIS — E559 Vitamin D deficiency, unspecified: Secondary | ICD-10-CM | POA: Diagnosis not present

## 2018-11-03 DIAGNOSIS — E782 Mixed hyperlipidemia: Secondary | ICD-10-CM | POA: Diagnosis not present

## 2018-11-03 DIAGNOSIS — F419 Anxiety disorder, unspecified: Secondary | ICD-10-CM | POA: Diagnosis not present

## 2018-11-03 DIAGNOSIS — I10 Essential (primary) hypertension: Secondary | ICD-10-CM | POA: Diagnosis not present

## 2018-12-15 DIAGNOSIS — R197 Diarrhea, unspecified: Secondary | ICD-10-CM | POA: Diagnosis not present

## 2018-12-15 DIAGNOSIS — K219 Gastro-esophageal reflux disease without esophagitis: Secondary | ICD-10-CM | POA: Diagnosis not present

## 2018-12-27 NOTE — Progress Notes (Signed)
Cardiology Office Note   Date:  12/30/2018   ID:  Kara Campos, DOB [birth year], MRN [id number]  PCP:  [name], MD Cardiologist:  Sanda Klein, MD 09/22/2018 K. Purcell Nails, DNP  10/25/2018 [name], PA-C   No chief complaint on file.   History of Present Illness: Kara Campos is a 83 y.o. female with a history of HTN w/ some med intolerances including HCTZ, sulfa, benazepril, hydralazine. BBs are listed but she has never been on them. Also w/ DM, HLD, OA.   11/26 office visit, losartan changed to irbesartan 75 mg , continue amlodipine, f/u w/ PCP for anxiety  Kara Campos presents for cardiology follow up.  Her BP is still running high. It is consistently lowest in the afternoon. She feels that may be due to drinking a glass of wine. She is taking the amlodipine in the morning, the irbesartan in the evening.  She takes "Calms" during the day, which is camomile. She has tried valerian root at bedtime, not much help. She wakes after 4 hours, wakes w/ anxiety.  Feels that her heart is beating fast.  She saw a PA or nurse practitioner in her PCPs office, her PCP had retired.  That practitioner was not comfortable prescribing anything for anxiety.  She has never been on a BB, willing to try it.  I reassured her that her eyedrops did not have significant systemic effects and would not interact the beta-blocker. She has not tried Benadryl at bedtime.  Dr Paulita Fujita feels she may have IBS.   She has her BP record with her today. SBP elevated 130s-180s, am and pm, mid-afternoon has been in the 90s, but that is rare.   She never gets chest pain.  She stays busy and does not feel at all limited in her activity by chest pain or shortness of breath.    Past Medical History:  Diagnosis Date  . Arthritis   . Blood transfusion   . Cataract   . Diabetes mellitus    borderline  . Hemorrhoids   . Hyperlipidemia   . Osteoporosis     Past Surgical History:  Procedure  Laterality Date  . TOTAL HIP ARTHROPLASTY Left 02/13/2017   Procedure: TOTAL HIP ARTHROPLASTY ANTERIOR APPROACH;  Surgeon: Paralee Cancel, MD;  Location: [facility];  Service: Orthopedics;  Laterality: Left;  [name] VAGINAL HYSTERECTOMY  1985    Current Outpatient Medications  Medication Sig Dispense Refill  . amLODipine (NORVASC) 2.5 MG tablet Take 1 tablet (2.5 mg total) by mouth every morning. 30 tablet 6  . co-enzyme Q-10 30 MG capsule Take 50 mg by mouth once a week.    . Cyanocobalamin (VITAMIN B-12 SL) Place 1 tablet under the tongue daily.    . fish oil-omega-3 fatty acids 1000 MG capsule Take 2 g by mouth daily.    . irbesartan (AVAPRO) 75 MG tablet Take 1 tablet (75 mg total) by mouth at bedtime. 30 tablet 11  . nortriptyline (PAMELOR) 10 MG capsule Take 10 mg by mouth at bedtime.    . simvastatin (ZOCOR) 10 MG tablet Take 1 tablet by mouth as needed. Once or twice a week    . timolol (BETIMOL) 0.5 % ophthalmic solution Place 2 drops into both eyes daily.      No current facility-administered medications for this visit.     Allergies:   Codeine; Nitrofurantoin macrocrystal; and Sulfa antibiotics    Social History:  The patient  reports that she has been  smoking cigarettes. She has been smoking about 0.50 packs per day. She has never used smokeless tobacco. She reports current alcohol use. She reports that she does not use drugs.   Family History:  The patient's family history includes Ovarian cancer in her maternal grandmother.  She indicated that her mother is deceased. She indicated that her father is deceased. She indicated that the status of her maternal grandmother is unknown.    ROS:  Please see the history of present illness. All other systems are reviewed and negative.    PHYSICAL EXAM: VS:  BP (!) 158/81   Pulse 71   Ht 5\' 1"  (1.549 m)   Wt 111 lb 12.8 oz (50.7 kg)   SpO2 99%   BMI 21.12 kg/m  , BMI Body mass index is 21.12 kg/m. GEN: Well nourished, well developed,  female in no acute distress HEENT: normal for age  Neck: no JVD, no carotid bruit, no masses Cardiac: RRR; soft murmur, no rubs, or gallops Respiratory:  clear to auscultation bilaterally, normal work of breathing GI: soft, nontender, nondistended, + BS MS: no deformity or atrophy; no edema; distal pulses are 2+ in all 4 extremities  Skin: warm and dry, no rash Neuro:  Strength and sensation are intact Psych: euthymic mood, full affect   EKG:  EKG is not ordered today.  Recent Labs: 07/20/2018: BUN 11; Creatinine, Ser 0.62; Potassium 4.4; Sodium 138  CBC    Component Value Date/Time   WBC 8.5 12/07/2017 0104   RBC 4.24 12/07/2017 0104   HGB 14.1 12/07/2017 0104   HCT 41.7 12/07/2017 0104   PLT 270 12/07/2017 0104   MCV 98.3 12/07/2017 0104   MCH 33.3 12/07/2017 0104   MCHC 33.8 12/07/2017 0104   RDW 13.6 12/07/2017 0104   LYMPHSABS 2.0 02/12/2017 1723   MONOABS 0.7 02/12/2017 1723   EOSABS 0.0 02/12/2017 1723   BASOSABS 0.0 02/12/2017 1723   CMP Latest Ref Rng & Units 07/20/2018 12/07/2017 02/15/2017  Glucose 65 - 99 mg/dL 92 123(H) 111(H)  BUN 8 - 27 mg/dL 11 21(H) 13  Creatinine 0.57 - 1.00 mg/dL 0.62 0.70 0.53  Sodium 134 - 144 mmol/L 138 137 141  Potassium 3.5 - 5.2 mmol/L 4.4 3.9 3.2(L)  Chloride 96 - 106 mmol/L 100 104 107  CO2 20 - 29 mmol/L 21 24 28   Calcium 8.7 - 10.3 mg/dL 9.6 9.3 8.5(L)  Total Protein 6.5 - 8.1 g/dL - - -  Total Bilirubin 0.3 - 1.2 mg/dL - - -  Alkaline Phos 38 - 126 U/L - - -  AST 15 - 41 U/L - - -  ALT 14 - 54 U/L - - -     Lipid Panel No results found for: CHOL, HDL, LDLCALC, LDLDIRECT, TRIG, CHOLHDL    Wt Readings from Last 3 Encounters:  12/30/18 111 lb 12.8 oz (50.7 kg)  10/25/18 114 lb (51.7 kg)  10/12/18 113 lb 9.6 oz (51.5 kg)     Other studies Reviewed: Additional studies/ records that were reviewed today include: Office notes and testing.  ASSESSMENT AND PLAN:  1.  Hypertension: Her blood pressure is elevated in the  morning and in the evening, but in the middle of the day is okay.  She feels this is because she drinks a glass of wine in the middle of the day. - Add metoprolol 25 mg one half tab initially then 1 tab if tolerated well.  Take in the evening -We discussed increasing the irbesartan to  twice daily, but she is reluctant to do this. -Continue to track blood pressure and heart rate and bring the records to follow-up appointments. - Follow-up with Dr. Sallyanne Kuster, discuss response to the medications and decide if the best plan is permissive hypertension with good control during the day or if further medication adjustments are needed  2.  Anxiety: Follow-up with PCP for this. -However, it is okay to take low-dose Benadryl as needed (6.25-12.5 mg) for sleep to see if this helps.  She is worried about the interaction with her nortriptyline, but I explained that she is on a very low dose of the nortriptyline and I did not feel Benadryl would be a problem in low doses.   Current medicines are reviewed at length with the patient today.  The patient has concerns regarding medicines.  Concerns were addressed  The following changes have been made: Add low-dose beta-blocker, okay to try low-dose Benadryl  Labs/ tests ordered today include:  No orders of the defined types were placed in this encounter.    Disposition:   FU with Sanda Klein, MD  Signed, [name], PA-C  12/30/2018 2:51 PM    [place] Phone: [phone]; Fax: [phone]

## 2018-12-30 ENCOUNTER — Ambulatory Visit (INDEPENDENT_AMBULATORY_CARE_PROVIDER_SITE_OTHER): Payer: Medicare Other | Admitting: Physician Assistant

## 2018-12-30 ENCOUNTER — Encounter: Payer: Self-pay | Admitting: Physician Assistant

## 2018-12-30 VITALS — BP 158/81 | HR 71 | Ht 61.0 in | Wt 111.8 lb

## 2018-12-30 DIAGNOSIS — F418 Other specified anxiety disorders: Secondary | ICD-10-CM | POA: Diagnosis not present

## 2018-12-30 DIAGNOSIS — I10 Essential (primary) hypertension: Secondary | ICD-10-CM

## 2018-12-30 MED ORDER — METOPROLOL SUCCINATE ER 25 MG PO TB24: 12.5000 mg | ORAL_TABLET | Freq: Every day | ORAL | 3 refills | Status: DC

## 2018-12-30 NOTE — Patient Instructions (Signed)
Medication Instructions:  START METOPROLOL XL 12.5MG  (1/2 TAB) X3 DAYS-INCREASE TO 1 TAB IF TOLERATED. CALL IF ANY ISSUES If you need a refill on your cardiac medications before your next appointment, please call your pharmacy.  Labwork: NONE ORDERED  When you have your labs (blood work) drawn today and your tests are completely normal, you will receive your results only by MyChart Message (if you have MyChart) -OR-  A paper copy in the mail.  If you have any lab test that is abnormal or we need to change your treatment, we will call you to review these results.  Special Instructions: ASK YOUR PHARMACIST IF OK FOR YOU TO TAKE 6.25 - 12.5MG  OF BENADRYL FOR SLEEP  Follow-Up: You will need a follow up appointment in [place].  You may see Sanda Klein, MD [name], PA-C or one of the following Advanced Practice Providers on your designated Care Team:  Almyra Deforest, [place]  Fabian Sharp, PA-C   At [facility], you and your health needs are our priority.  As part of our continuing mission to provide you with exceptional heart care, we have created designated Provider Care Teams.  These Care Teams include your primary Cardiologist (physician) and Advanced Practice Providers (APPs -  Physician Assistants and Nurse Practitioners) who all work together to provide you with the care you need, when you need it.  Thank you for choosing [facility] at [facility]!!

## 2019-01-01 NOTE — Progress Notes (Signed)
Beta blocker a good idea, if she tolerates it. MCr

## 2019-01-24 ENCOUNTER — Other Ambulatory Visit: Payer: Self-pay | Admitting: Physician Assistant

## 2019-01-24 NOTE — Telephone Encounter (Signed)
This is Dr. Croitoru's pt 

## 2019-02-02 IMAGING — CR DG CHEST 2V
2 series · 2 of 2 positions shown · non-contrast
Comparison: 02/12/2017 chest radiograph

CLINICAL DATA: 81 y/o  F; hypertension and chest pain.

EXAM:
CHEST  2 VIEW

[w chest pa]
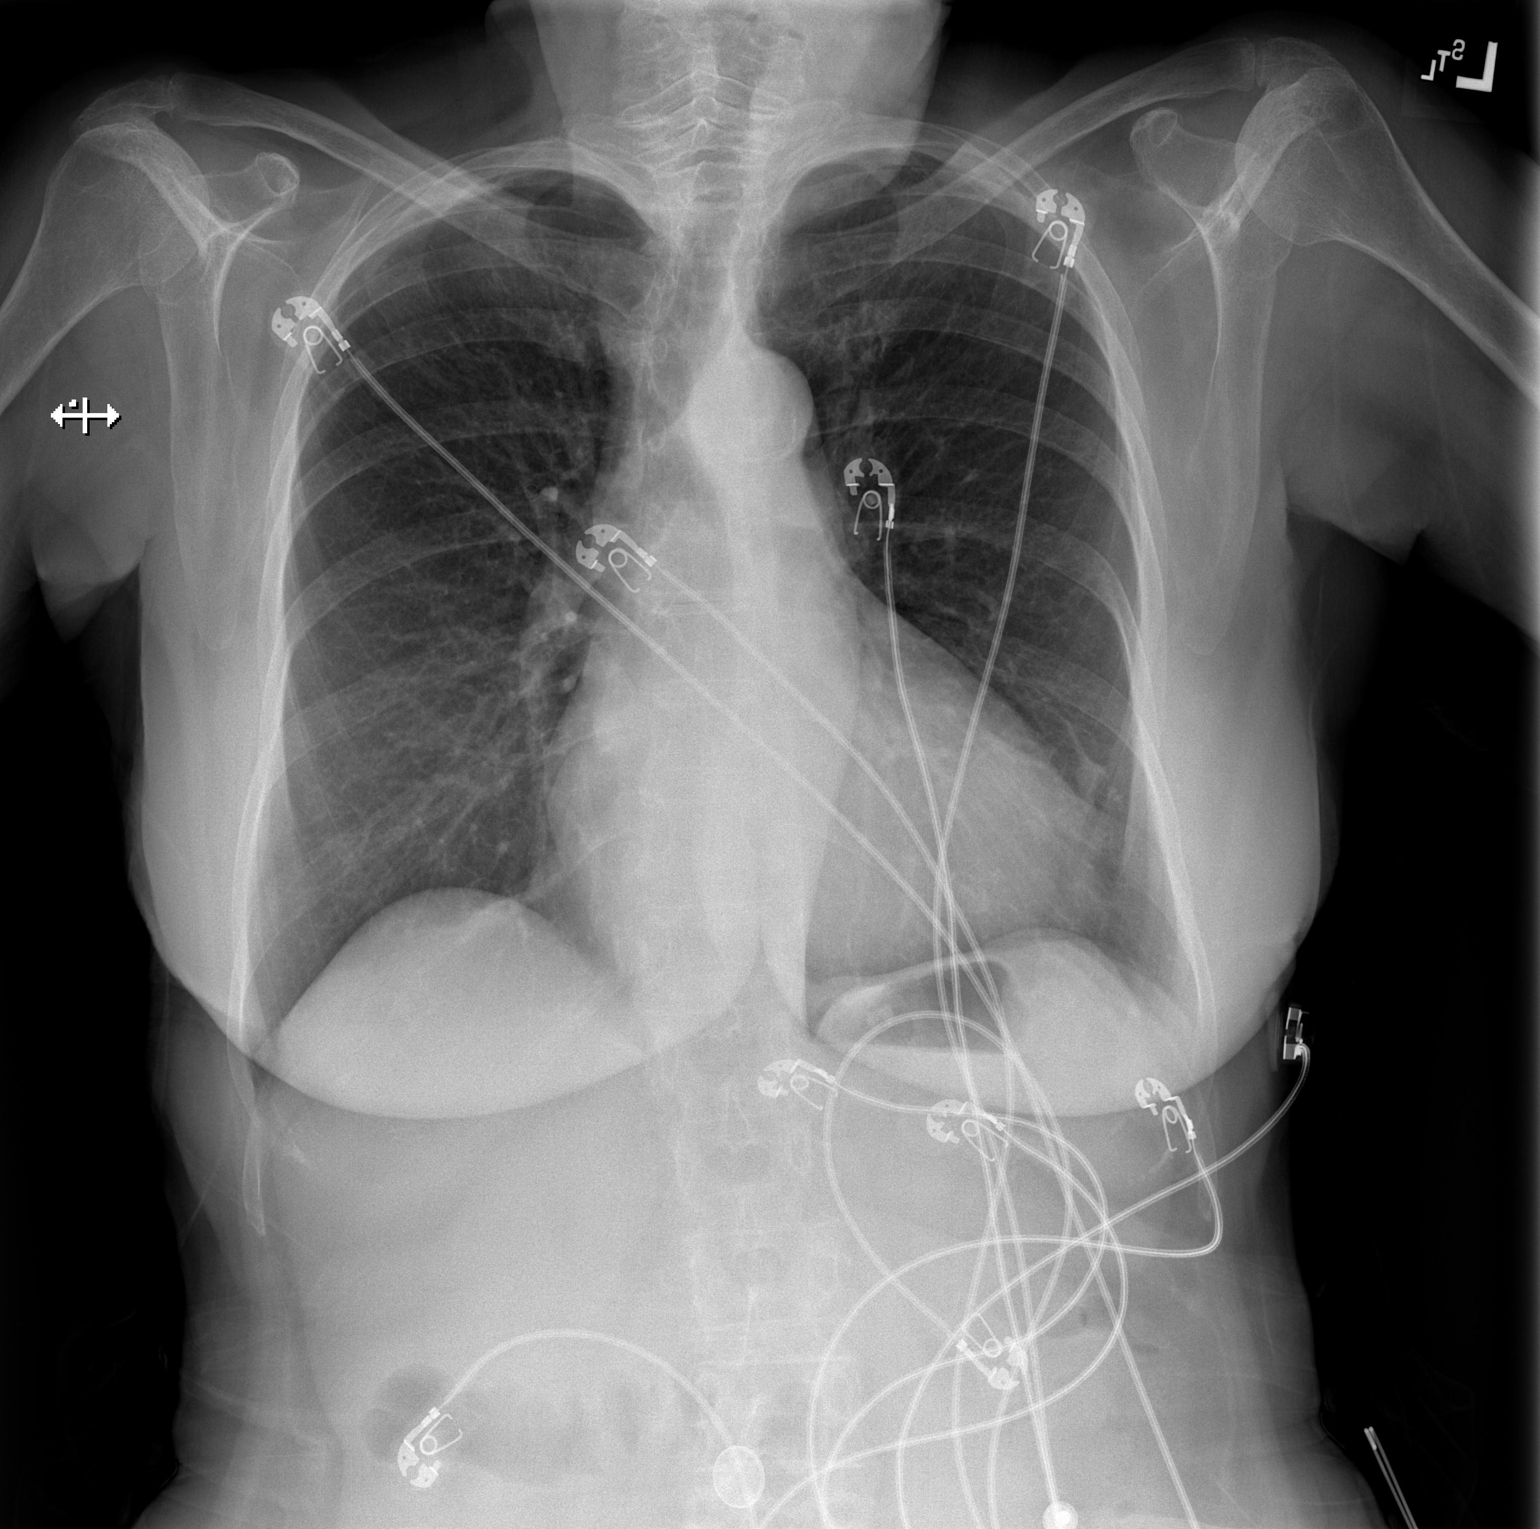

[w chest lat]
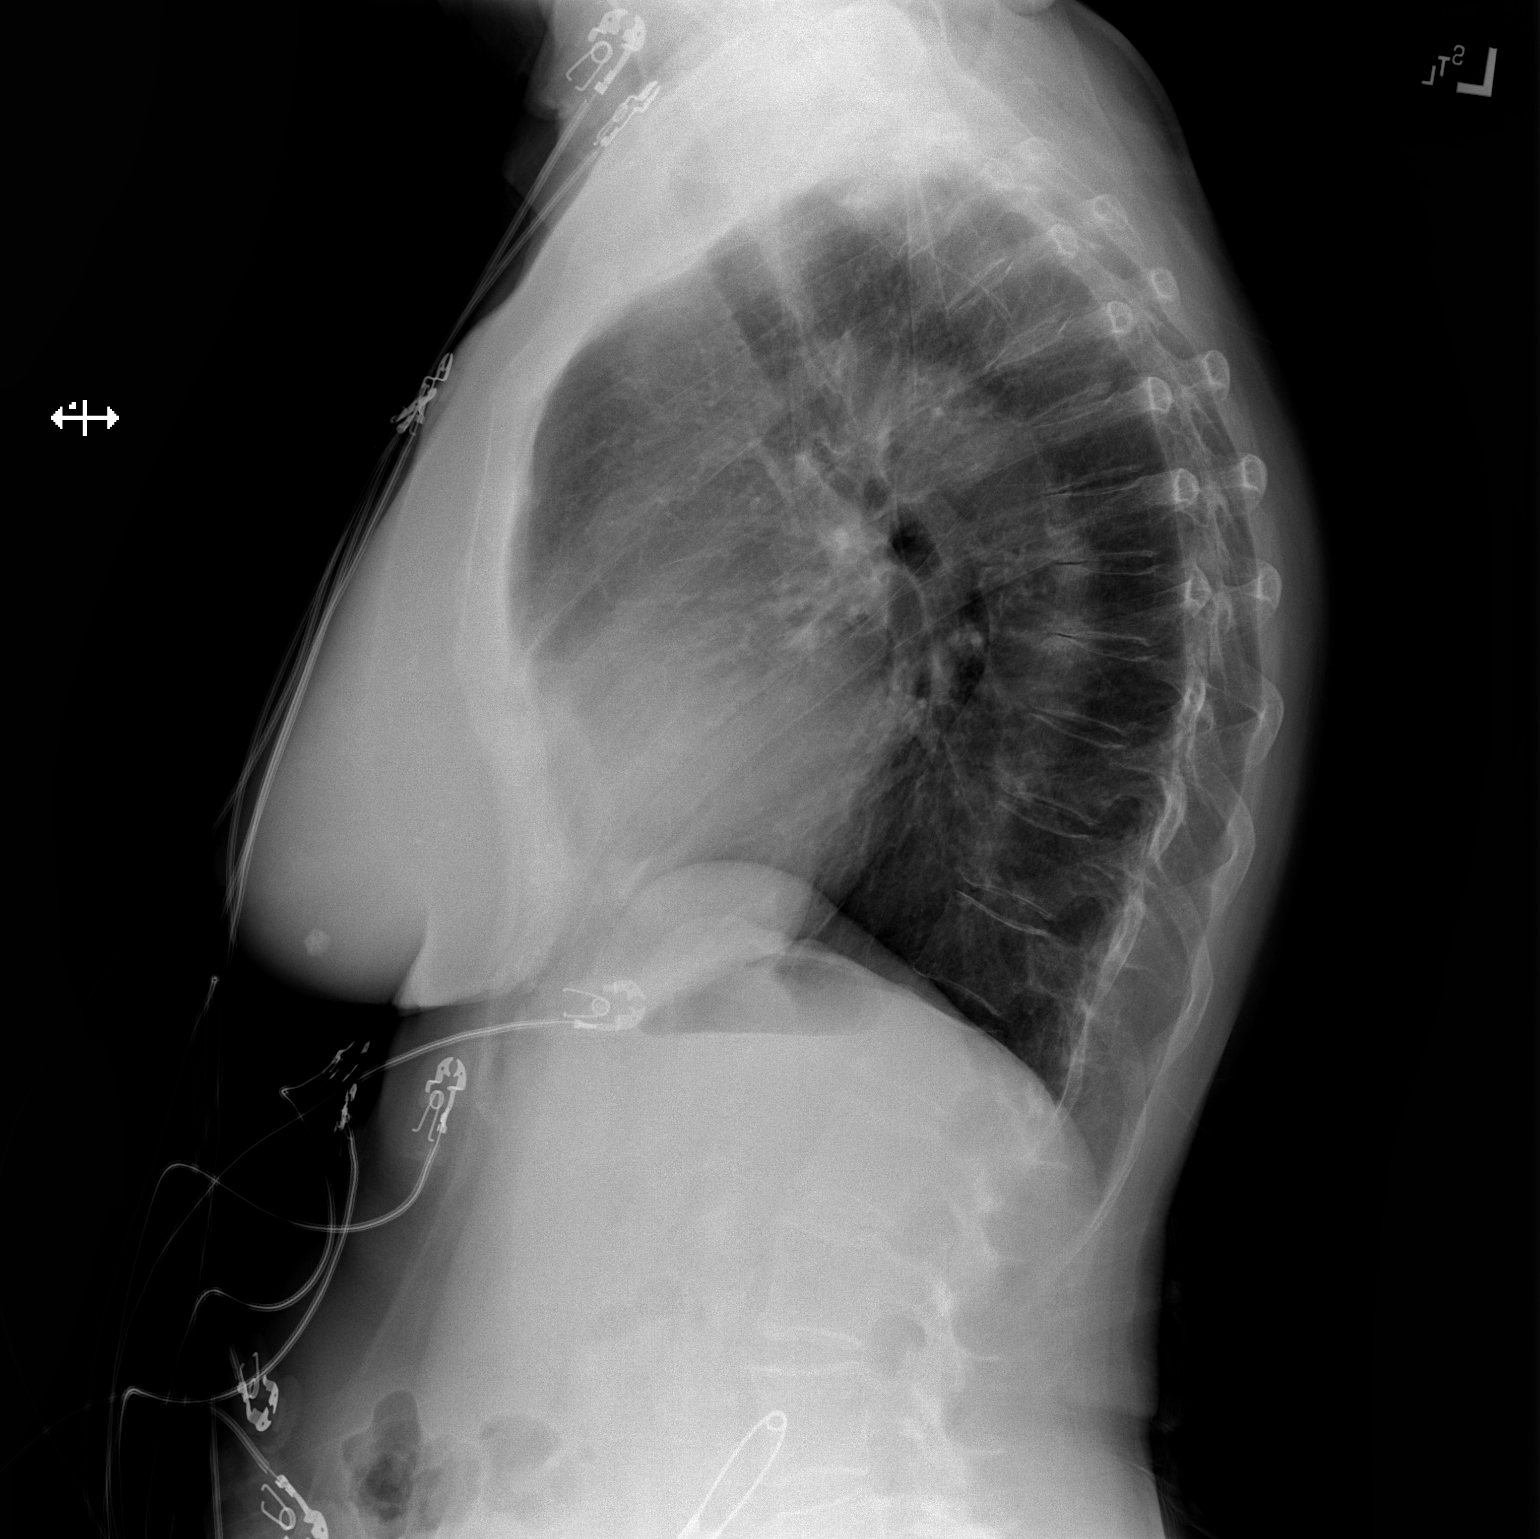

[2 of 2 positions shown; findings below may reference images not displayed]

FINDINGS: Stable cardiomegaly given projection and technique. Aortic
atherosclerosis with calcification. Clear lungs. No pleural effusion
or pneumothorax. No acute osseous abnormality is evident.
IMPRESSION: Stable mild cardiomegaly and aortic atherosclerosis. No acute
pulmonary process identified.

By: Djilalli Eco M.D.

## 2019-04-06 ENCOUNTER — Telehealth: Payer: Self-pay | Admitting: Cardiovascular Disease

## 2019-04-06 NOTE — Telephone Encounter (Signed)
Returned call to patient she stated she would like appointment with Dr.Croitoru to discuss her medications.Stated she feels like she needs to reduce her medications.She still has sob occasionally.Virtual visit scheduled with Dr.Croitoru 04/11/19 at 12:00 noon.

## 2019-04-06 NOTE — Telephone Encounter (Signed)
New Message    Pt c/o medication issue:  1. Name of Medication: Metoprolol   2. How are you currently taking this medication (dosage and times per day)? 1x daily 25mg    3. Are you having a reaction (difficulty breathing--STAT)? No   4. What is your medication issue? Some SOB and she doesn't know if it is this medication causing the issue or another one she is taking

## 2019-04-10 ENCOUNTER — Telehealth: Payer: Self-pay | Admitting: Cardiovascular Disease

## 2019-04-11 ENCOUNTER — Telehealth (INDEPENDENT_AMBULATORY_CARE_PROVIDER_SITE_OTHER): Payer: Medicare Other | Admitting: Cardiovascular Disease

## 2019-04-11 ENCOUNTER — Encounter: Payer: Self-pay | Admitting: Cardiovascular Disease

## 2019-04-11 DIAGNOSIS — R002 Palpitations: Secondary | ICD-10-CM

## 2019-04-11 DIAGNOSIS — I10 Essential (primary) hypertension: Secondary | ICD-10-CM

## 2019-04-11 MED ORDER — METOPROLOL SUCCINATE ER 25 MG PO TB24: 12.5000 mg | ORAL_TABLET | Freq: Every day | ORAL | 3 refills | Status: DC

## 2019-04-11 MED ORDER — AMLODIPINE BESYLATE 5 MG PO TABS: 5.0000 mg | ORAL_TABLET | Freq: Every day | ORAL | 3 refills | Status: DC

## 2019-04-11 NOTE — Progress Notes (Signed)
Virtual Visit via Telephone Note   This visit type was conducted due to national recommendations for restrictions regarding the COVID-19 Pandemic (e.g. social distancing) in an effort to limit this patient's exposure and mitigate transmission in our community.  Due to her co-morbid illnesses, this patient is at least at moderate risk for complications without adequate follow up.  This format is felt to be most appropriate for this patient at this time.  The patient did not have access to video technology/had technical difficulties with video requiring transitioning to audio format only (telephone).  All issues noted in this document were discussed and addressed.  No physical exam could be performed with this format.  Please refer to the patient's chart for her  consent to telehealth for [facility].   Date:  04/11/2019   ID:  Kara Campos, DOB [birth year], MRN [id number]  Patient Location: Home Provider Location: Home  PCP:  Kara Gravel, MD  Cardiologist:  Kara Klein, MD  Electrophysiologist:  None   Evaluation Performed:  Follow-Up Visit  Chief Complaint:  HTN  History of Present Illness:    Kara Campos is a 83 y.o. female with longstanding mild essential hypertension, that has been difficult to treat due to numerous side effects, with frequent adjustments in her medical regimen.  She now reports that the metoprolol in a full 25 mg dose makes her feel "lifeless" and mildly short of breath with activity.  She does not have any leg edema on the current low dose of amlodipine.  She reports that her blood pressure is consistently elevated in the mornings, but around 5 PM it will be normal around 120/65.  Occasionally has a heart rapid heart rates that awakes her at night, but she believes that the symptoms that she attributes to the metoprolol are worse than the benefit.  Denies dizziness, lightheadedness, syncope, chest pain at rest or with activity, focal neurological complaints  or intermittent claudication.  The patient does not have symptoms concerning for COVID-19 infection (fever, chills, cough, or new shortness of breath).    Past Medical History:  Diagnosis Date  . Arthritis   . Blood transfusion   . Cataract   . Diabetes mellitus    borderline  . Hemorrhoids   . Hyperlipidemia   . Osteoporosis    Past Surgical History:  Procedure Laterality Date  . TOTAL HIP ARTHROPLASTY Left 02/13/2017   Procedure: TOTAL HIP ARTHROPLASTY ANTERIOR APPROACH;  Surgeon: Paralee Cancel, MD;  Location: [facility];  Service: Orthopedics;  Laterality: Left;  [name] VAGINAL HYSTERECTOMY  1985     Current Meds  Medication Sig  . amLODipine (NORVASC) 5 MG tablet Take 1 tablet (5 mg total) by mouth daily.  [name] co-enzyme Q-10 30 MG capsule Take 50 mg by mouth once a week.  . Cyanocobalamin (VITAMIN B-12 SL) Place 1 tablet under the tongue daily.  . fish oil-omega-3 fatty acids 1000 MG capsule Take 2 g by mouth as directed.   . irbesartan (AVAPRO) 75 MG tablet Take 1 tablet (75 mg total) by mouth at bedtime.  . Lactobacillus (PROBIOTIC ACIDOPHILUS PO) Take by mouth.  . metoprolol succinate (TOPROL XL) 25 MG 24 hr tablet Take 0.5 tablets (12.5 mg total) by mouth at bedtime.  . nortriptyline (PAMELOR) 10 MG capsule Take 10 mg by mouth at bedtime.  . simvastatin (ZOCOR) 10 MG tablet Take 1 tablet by mouth as needed. Once or twice a week  . timolol (BETIMOL) 0.5 % ophthalmic solution Place 2  drops into both eyes daily.   . [DISCONTINUED] amLODipine (NORVASC) 2.5 MG tablet TAKE 1 TABLET BY MOUTH EVERY MORNING  . [DISCONTINUED] metoprolol succinate (TOPROL XL) 25 MG 24 hr tablet Take 0.5 tablets (12.5 mg total) by mouth at bedtime. 1/2 TAB X3 DAYS-IF TOLERATED INCREASE TO 1 TAB     Allergies:   Codeine; Nitrofurantoin macrocrystal; and Sulfa antibiotics   Social History   Tobacco Use  . Smoking status: Current Every Day Smoker    Packs/day: 0.50    Types: Cigarettes  . Smokeless  tobacco: Never Used  Substance Use Topics  . Alcohol use: Yes    Comment: Occas  . Drug use: No     Family Hx: The patient's family history includes Ovarian cancer in her maternal grandmother.  ROS:   Please see the history of present illness.     All other systems reviewed and are negative.   Prior CV studies:   The following studies were reviewed today:    Labs/Other Tests and Data Reviewed:    EKG:  An ECG dated 10/12/2018 was personally reviewed today and demonstrated:  Sinus rhythm with slightly early RS transition in V2 otherwise normal tracing  Recent Labs: 07/20/2018: BUN 11; Creatinine, Ser 0.62; Potassium 4.4; Sodium 138   Recent Lipid Panel No results found for: CHOL, TRIG, HDL, CHOLHDL, LDLCALC, LDLDIRECT  Wt Readings from Last 3 Encounters:  04/11/19 110 lb (49.9 kg)  12/30/18 111 lb 12.8 oz (50.7 kg)  10/25/18 114 lb (51.7 kg)     Objective:    Vital Signs:  BP (!) 175/83   Pulse 77   Ht 5\' 1"  (1.549 m)   Wt 110 lb (49.9 kg)   BMI 20.78 kg/m    VITAL SIGNS:  reviewed Unable to examine  ASSESSMENT & PLAN:    1. HTN: As we have previously discussed, there is some degree of normal circadian rhythm variation in her blood pressure that is unavoidable.  It may make it challenging to always get her blood pressure in types normal constraints.  She is very slender build and low doses of medicines appear to have a increased impact on her.  She wants to decrease the metoprolol succinate to the previous 12.5 mg daily dose.  She understands that this may lead to more palpitations, rather than fewer.  I suggested increasing the amlodipine to 5 mg once daily and waiting for a couple of weeks before reevaluating that additional adjustments.  At my previous encounter with her she wanted to try benazepril, which "worked well for her sister", but did not feel well by taking this medication.  She is reportedly allergic to thiazide diuretics.  COVID-19 Education: The  signs and symptoms of COVID-19 were discussed with the patient and how to seek care for testing (follow up with PCP or arrange E-visit).  The importance of social distancing was discussed today.  Time:   Today, I have spent 14 minutes with the patient with telehealth technology discussing the above problems.     Medication Adjustments/Labs and Tests Ordered: Current medicines are reviewed at length with the patient today.  Concerns regarding medicines are outlined above.   Tests Ordered: No orders of the defined types were placed in this encounter.   Medication Changes: Meds ordered this encounter  Medications  . amLODipine (NORVASC) 5 MG tablet    Sig: Take 1 tablet (5 mg total) by mouth daily.    Dispense:  30 tablet    Refill:  3  .  metoprolol succinate (TOPROL XL) 25 MG 24 hr tablet    Sig: Take 0.5 tablets (12.5 mg total) by mouth at bedtime.    Dispense:  30 tablet    Refill:  3    Disposition:  Follow up 3 months  Signed, Kara Klein, MD  04/11/2019 3:48 PM    [place]

## 2019-04-11 NOTE — Patient Instructions (Signed)
Medication Instructions:  Increase Amlodipine to 5 mg daily.  Decrease Metoprolol Succinate to 12.5 mg (1/2 tab) daily.  If you need a refill on your cardiac medications before your next appointment, please call your pharmacy.   Follow-Up: At [facility], you and your health needs are our priority.  As part of our continuing mission to provide you with exceptional heart care, we have created designated Provider Care Teams.  These Care Teams include your primary Cardiologist (physician) and Advanced Practice Providers (APPs -  Physician Assistants and Nurse Practitioners) who all work together to provide you with the care you need, when you need it. You will need a follow up appointment in 3 months.  Please call our office 2 months in advance to schedule this appointment.  You may see Sanda Klein, MD or one of the following Advanced Practice Providers on your designated Care Team: . [name], PA  Any Other Special Instructions Will Be Listed Below (If Applicable). Your physician has requested that you regularly monitor and record your blood pressure readings at home. Please use the same machine at the same time of day to check your readings and record them and send them to Dr. Sallyanne Kuster in 2-3 weeks.

## 2019-04-13 ENCOUNTER — Ambulatory Visit: Payer: Medicare Other | Admitting: Cardiovascular Disease

## 2019-05-12 DIAGNOSIS — E782 Mixed hyperlipidemia: Secondary | ICD-10-CM | POA: Diagnosis not present

## 2019-05-12 DIAGNOSIS — I10 Essential (primary) hypertension: Secondary | ICD-10-CM | POA: Diagnosis not present

## 2019-05-12 DIAGNOSIS — R7301 Impaired fasting glucose: Secondary | ICD-10-CM | POA: Diagnosis not present

## 2019-05-12 DIAGNOSIS — E559 Vitamin D deficiency, unspecified: Secondary | ICD-10-CM | POA: Diagnosis not present

## 2019-05-19 DIAGNOSIS — E7800 Pure hypercholesterolemia, unspecified: Secondary | ICD-10-CM | POA: Diagnosis not present

## 2019-05-19 DIAGNOSIS — R21 Rash and other nonspecific skin eruption: Secondary | ICD-10-CM | POA: Diagnosis not present

## 2019-05-19 DIAGNOSIS — E559 Vitamin D deficiency, unspecified: Secondary | ICD-10-CM | POA: Diagnosis not present

## 2019-05-19 DIAGNOSIS — I10 Essential (primary) hypertension: Secondary | ICD-10-CM | POA: Diagnosis not present

## 2019-05-19 DIAGNOSIS — F419 Anxiety disorder, unspecified: Secondary | ICD-10-CM | POA: Diagnosis not present

## 2019-05-30 NOTE — Telephone Encounter (Signed)
Opened in error

## 2019-06-08 DIAGNOSIS — H16223 Keratoconjunctivitis sicca, not specified as Sjogren's, bilateral: Secondary | ICD-10-CM | POA: Diagnosis not present

## 2019-06-08 DIAGNOSIS — H04123 Dry eye syndrome of bilateral lacrimal glands: Secondary | ICD-10-CM | POA: Diagnosis not present

## 2019-06-08 DIAGNOSIS — H401132 Primary open-angle glaucoma, bilateral, moderate stage: Secondary | ICD-10-CM | POA: Diagnosis not present

## 2019-06-08 DIAGNOSIS — Z961 Presence of intraocular lens: Secondary | ICD-10-CM | POA: Diagnosis not present

## 2019-06-29 ENCOUNTER — Other Ambulatory Visit: Payer: Self-pay

## 2019-07-22 NOTE — Progress Notes (Signed)
Cardiology Office Note   Date:  07/24/2019   ID:  Kara Campos, DOB Jan 27, 1936, MRN [id number]  PCP:  [name], MD  Cardiologist: Dr.Croitoru  CC: Follow up Hypertension    History of Present Illness: Kara Campos is a 83 y.o. female who presents for ongoing assessment and management of difficult to control mild essential hypertension.  She has had multiple medication changes.  She is intolerant of metoprolol in the full 25 mg dose because it made her feel "lifeless" and short of breath with activity.  She was tolerating a low-dose amlodipine.  She also complained of occasional episodes of rapid heart rate which awakened her at night.  She was evaluated last via virtual visit on 04/11/2019. Dr.Croituro noted that "there is some degree of normal circadian rhythm variation in her blood pressure that is unavoidable."  He recommended increasing amlodipine to 5 mg daily.  The patient wanted to try benazepril which had worked well for her sister, however the patient did not tolerate this medication, she is also reportedly allergic to thiazide diuretics.  She comes today with complaints of fatigue and nausea on higher dose of amlodipine. She decreased it to 2.5 mg and has felt better.She also sometimes takes metoprolol 12.5 mg and at other times takes 25 mg, depending on how she feels. She has stopped taking the simvastatin as she states it given her myalgia pain. She unfortunately continues to smoke.    Past Medical History:  Diagnosis Date  . Arthritis   . Blood transfusion   . Cataract   . Diabetes mellitus    borderline  . Hemorrhoids   . Hyperlipidemia   . Osteoporosis     Past Surgical History:  Procedure Laterality Date  . TOTAL HIP ARTHROPLASTY Left 02/13/2017   Procedure: TOTAL HIP ARTHROPLASTY ANTERIOR APPROACH;  Surgeon: Paralee Cancel, MD;  Location: [facility];  Service: Orthopedics;  Laterality: Left;  [name] VAGINAL HYSTERECTOMY  1985     Current Outpatient Medications   Medication Sig Dispense Refill  . amLODipine (NORVASC) 2.5 MG tablet Take 1 tablet (2.5 mg total) by mouth daily. 90 tablet 3  . co-enzyme Q-10 30 MG capsule Take 50 mg by mouth once a week.    . Cyanocobalamin (VITAMIN B-12 SL) Place 1 tablet under the tongue daily.    . fish oil-omega-3 fatty acids 1000 MG capsule Take 2 g by mouth as directed.     . irbesartan (AVAPRO) 75 MG tablet Take 1 tablet (75 mg total) by mouth at bedtime. 90 tablet 3  . Lactobacillus (PROBIOTIC ACIDOPHILUS PO) Take by mouth.    . metoprolol succinate (TOPROL-XL) 25 MG 24 hr tablet Take 1 tablet (25 mg total) by mouth daily. 90 tablet 3  . nortriptyline (PAMELOR) 10 MG capsule Take 10 mg by mouth at bedtime.    . timolol (BETIMOL) 0.5 % ophthalmic solution Place 2 drops into both eyes daily.      No current facility-administered medications for this visit.     Allergies:   Codeine, Nitrofurantoin macrocrystal, and Sulfa antibiotics    Social History:  The patient  reports that she has been smoking cigarettes. She has been smoking about 0.50 packs per day. She has never used smokeless tobacco. She reports current alcohol use. She reports that she does not use drugs.   Family History:  The patient's family history includes Ovarian cancer in her maternal grandmother.    ROS: All other systems are reviewed and negative. Unless otherwise mentioned  in H&P    PHYSICAL EXAM: VS:  BP 138/72   Pulse 89   Ht 5\' 1"  (1.549 m)   Wt 105 lb (47.6 kg)   SpO2 96%   BMI 19.84 kg/m  , BMI Body mass index is 19.84 kg/m. GEN: Well nourished, well developed, in no acute distress HEENT: normal Neck: no JVD, carotid bruits, or masses Cardiac: RRR; soft 1/6 systolic  murmurs, rubs, or gallops,no edema  Respiratory:  Clear to auscultation bilaterally, normal work of breathing GI: soft, nontender, nondistended, + BS MS: no deformity or atrophy Skin: warm and dry, no rash Neuro:  Strength and sensation are intact Psych:  euthymic mood, full affect   EKG: Not completed this office visit.   Recent Labs: No results found for requested labs within last 8760 hours.    Lipid Panel No results found for: CHOL, TRIG, HDL, CHOLHDL, VLDL, LDLCALC, LDLDIRECT    Wt Readings from Last 3 Encounters:  07/24/19 105 lb (47.6 kg)  04/11/19 110 lb (49.9 kg)  12/30/18 111 lb 12.8 oz (50.7 kg)      ASSESSMENT AND PLAN:  1. Hypertension: BP has been difficult to control, for multifactorial reasons.   She manipulates her medications depending on how she feels and what her BP is doing at the time. She does not tolerate higher doses of amlodipine. I have reviewed the process of BP recording, sitting for 5 minutes, and not smoking around the time of the BP check. Will keep her on current regimen as she is tolerating it.  See [place] in 6 months.   2. Hypercholesterolemia: She is not tolerating statin therapy. Is not interested in alternative therapies.   3. Ongoing tobacco abuse: She is given education about increased risk factors with smoking. She verbalizes understanding.   Current medicines are reviewed at length with the patient today.    Labs/ tests ordered today include: None   Phill Myron. [place] Pugh, ANP, AACC   07/24/2019 1:33 PM    [facility] [place] [address] Office [phone] Fax [phone]

## 2019-07-24 ENCOUNTER — Other Ambulatory Visit: Payer: Self-pay

## 2019-07-24 ENCOUNTER — Ambulatory Visit (INDEPENDENT_AMBULATORY_CARE_PROVIDER_SITE_OTHER): Payer: Medicare Other | Admitting: Adult Health

## 2019-07-24 ENCOUNTER — Encounter: Payer: Self-pay | Admitting: Adult Health

## 2019-07-24 VITALS — BP 138/72 | HR 89 | Ht 61.0 in | Wt 105.0 lb

## 2019-07-24 DIAGNOSIS — I10 Essential (primary) hypertension: Secondary | ICD-10-CM

## 2019-07-24 DIAGNOSIS — E7800 Pure hypercholesterolemia, unspecified: Secondary | ICD-10-CM

## 2019-07-24 MED ORDER — IRBESARTAN 75 MG PO TABS: 75.0000 mg | ORAL_TABLET | Freq: Every day | ORAL | 3 refills | Status: DC

## 2019-07-24 MED ORDER — METOPROLOL SUCCINATE ER 25 MG PO TB24: 25.0000 mg | ORAL_TABLET | Freq: Every day | ORAL | 3 refills | Status: DC

## 2019-07-24 MED ORDER — AMLODIPINE BESYLATE 2.5 MG PO TABS: 2.5000 mg | ORAL_TABLET | Freq: Every day | ORAL | 3 refills | Status: DC

## 2019-07-24 NOTE — Patient Instructions (Signed)
Medication Instructions:  DECREASE- Amlodipine 2.5 mg by mouth daily  If you need a refill on your cardiac medications before your next appointment, please call your pharmacy.  Labwork: None Ordered   Testing/Procedures: None Ordered  Follow-Up: You will need a follow up appointment in 6 months.  Please call our office 2 months in advance to schedule this appointment.  You may see Sanda Klein, MD or one of the following Advanced Practice Providers on your designated Care Team: [place], [place] . Fabian Sharp, PA-C     At [facility], you and your health needs are our priority.  As part of our continuing mission to provide you with exceptional heart care, we have created designated Provider Care Teams.  These Care Teams include your primary Cardiologist (physician) and Advanced Practice Providers (APPs -  Physician Assistants and Nurse Practitioners) who all work together to provide you with the care you need, when you need it.  Thank you for choosing [facility]!!

## 2019-08-24 DIAGNOSIS — Z23 Encounter for immunization: Secondary | ICD-10-CM | POA: Diagnosis not present

## 2019-09-01 ENCOUNTER — Other Ambulatory Visit: Payer: Self-pay

## 2019-09-01 DIAGNOSIS — Z20822 Contact with and (suspected) exposure to covid-19: Secondary | ICD-10-CM

## 2019-09-02 LAB — NOVEL CORONAVIRUS, NAA: SARS-CoV-2, NAA: NOT DETECTED

## 2019-09-07 ENCOUNTER — Encounter: Payer: Self-pay | Admitting: Gynecology

## 2019-10-06 DIAGNOSIS — H16223 Keratoconjunctivitis sicca, not specified as Sjogren's, bilateral: Secondary | ICD-10-CM | POA: Diagnosis not present

## 2019-10-06 DIAGNOSIS — H04123 Dry eye syndrome of bilateral lacrimal glands: Secondary | ICD-10-CM | POA: Diagnosis not present

## 2019-10-06 DIAGNOSIS — Z961 Presence of intraocular lens: Secondary | ICD-10-CM | POA: Diagnosis not present

## 2019-10-06 DIAGNOSIS — H401132 Primary open-angle glaucoma, bilateral, moderate stage: Secondary | ICD-10-CM | POA: Diagnosis not present

## 2020-01-15 DIAGNOSIS — R300 Dysuria: Secondary | ICD-10-CM | POA: Diagnosis not present

## 2020-01-15 DIAGNOSIS — N390 Urinary tract infection, site not specified: Secondary | ICD-10-CM | POA: Diagnosis not present

## 2020-01-24 ENCOUNTER — Ambulatory Visit: Payer: Medicare Other | Admitting: Cardiovascular Disease

## 2020-01-24 DIAGNOSIS — R300 Dysuria: Secondary | ICD-10-CM | POA: Diagnosis not present

## 2020-01-24 DIAGNOSIS — N281 Cyst of kidney, acquired: Secondary | ICD-10-CM | POA: Diagnosis not present

## 2020-01-24 DIAGNOSIS — R350 Frequency of micturition: Secondary | ICD-10-CM | POA: Diagnosis not present

## 2020-02-01 ENCOUNTER — Encounter: Payer: Self-pay | Admitting: Cardiovascular Disease

## 2020-02-01 ENCOUNTER — Telehealth: Payer: Self-pay | Admitting: *Deleted

## 2020-02-01 ENCOUNTER — Telehealth (INDEPENDENT_AMBULATORY_CARE_PROVIDER_SITE_OTHER): Payer: Medicare Other | Admitting: Cardiovascular Disease

## 2020-02-01 DIAGNOSIS — I10 Essential (primary) hypertension: Secondary | ICD-10-CM | POA: Diagnosis not present

## 2020-02-01 MED ORDER — METOPROLOL SUCCINATE ER 25 MG PO TB24: 25.0000 mg | ORAL_TABLET | Freq: Every day | ORAL | 3 refills | Status: DC

## 2020-02-01 NOTE — Addendum Note (Signed)
Addended by: Ricci Barker on: 02/01/2020 09:35 AM   Modules accepted: Orders

## 2020-02-01 NOTE — Patient Instructions (Signed)
Medication Instructions:  INCREASE the Metoprolol Succinate to 25 mg once daily *If you need a refill on your cardiac medications before your next appointment, please call your pharmacy*   Lab Work: None ordered If you have labs (blood work) drawn today and your tests are completely normal, you will receive your results only by: [name] MyChart Message (if you have MyChart) OR . A paper copy in the mail If you have any lab test that is abnormal or we need to change your treatment, we will call you to review the results.   Testing/Procedures: None ordered   Follow-Up: At [facility], you and your health needs are our priority.  As part of our continuing mission to provide you with exceptional heart care, we have created designated Provider Care Teams.  These Care Teams include your primary Cardiologist (physician) and Advanced Practice Providers (APPs -  Physician Assistants and Nurse Practitioners) who all work together to provide you with the care you need, when you need it.  We recommend signing up for the patient portal called "MyChart".  Sign up information is provided on this After Visit Summary.  MyChart is used to connect with patients for Virtual Visits (Telemedicine).  Patients are able to view lab/test results, encounter notes, upcoming appointments, etc.  Non-urgent messages can be sent to your provider as well.   To learn more about what you can do with MyChart, go to [email or web address].    Your next appointment:   12 month(s)  The format for your next appointment:   In Person  Provider:   You may see Sanda Klein, MD or one of the following Advanced Practice Providers on your designated Care Team:    Almyra Deforest, PA-C  Fabian Sharp, PA-C or   Roby [place], [place]

## 2020-02-01 NOTE — Progress Notes (Signed)
Virtual Visit via Telephone Note   This visit type was conducted due to national recommendations for restrictions regarding the COVID-19 Pandemic (e.g. social distancing) in an effort to limit this patient's exposure and mitigate transmission in our community.  Due to her co-morbid illnesses, this patient is at least at moderate risk for complications without adequate follow up.  This format is felt to be most appropriate for this patient at this time.  The patient did not have access to video technology/had technical difficulties with video requiring transitioning to audio format only (telephone).  All issues noted in this document were discussed and addressed.  No physical exam could be performed with this format.  Please refer to the patient's chart for her  consent to telehealth for [facility].   Date:  02/01/2020   ID:  Kara Campos, DOB Mar 31, 1936, MRN [id number]  Patient Location: Home Provider Location: Home  PCP:  [name], MD  Cardiologist:  Sanda Klein, MD  Electrophysiologist:  None   Evaluation Performed:  Follow-Up Visit  Chief Complaint:  HTN  History of Present Illness:    Kara Campos is a 84 y.o. female with longstanding mild essential hypertension, that has been difficult to treat due to numerous side effects, with frequent adjustments in her medical regimen.  We have reduced her metoprolol to 12.5 mg daily due to lack of energy and reduced exercise tolerance, and her blood pressure was reasonably well controlled.  However recently her husband has undergone abdominal aortic aneurysm repair with Dr. Trula Slade and she has had her own urological issues.  This is led to more anxiety and worry and she has had higher systolic blood pressures and more frequent palpitations at night.  Her morning blood pressure is a little higher than the afternoon, but it is quite normal.  She denies chest pain, lower extremity edema, syncope, dizziness, intermittent claudication or  any focal neurological complaints.  She has not had any falls or bleeding.  She does not have orthopnea, PND or true exertional dyspnea.  She just gets tired easily.    She did not tolerate higher doses of amlodipine due to edema, fatigue and nausea.  She tried benazepril but it did not work well for her.  She is allergic to thiazide diuretics.  She has stopped taking the statin, due to side effects even when she took coenzyme Q 10.  She inquires whether she should still take the co-Q10.  The patient does not have symptoms concerning for COVID-19 infection (fever, chills, cough, or new shortness of breath).    Past Medical History:  Diagnosis Date  . Arthritis   . Blood transfusion   . Cataract   . Diabetes mellitus    borderline  . Hemorrhoids   . Hyperlipidemia   . Osteoporosis    Past Surgical History:  Procedure Laterality Date  . TOTAL HIP ARTHROPLASTY Left 02/13/2017   Procedure: TOTAL HIP ARTHROPLASTY ANTERIOR APPROACH;  Surgeon: Paralee Cancel, MD;  Location: [facility];  Service: Orthopedics;  Laterality: Left;  [name] VAGINAL HYSTERECTOMY  1985     Current Meds  Medication Sig  . amLODipine (NORVASC) 2.5 MG tablet Take 1 tablet (2.5 mg total) by mouth daily.  . Cyanocobalamin (VITAMIN B-12 SL) Place 1 tablet under the tongue daily.  . fish oil-omega-3 fatty acids 1000 MG capsule Take 2 g by mouth as directed.   . irbesartan (AVAPRO) 75 MG tablet Take 1 tablet (75 mg total) by mouth at bedtime.  [name]  Lactobacillus (PROBIOTIC ACIDOPHILUS PO) Take by mouth.  . metoprolol succinate (TOPROL-XL) 25 MG 24 hr tablet Take 1 tablet (25 mg total) by mouth daily. (Patient taking differently: Take 12.5 mg by mouth daily. )  . nortriptyline (PAMELOR) 10 MG capsule Take 10 mg by mouth at bedtime.  . timolol (BETIMOL) 0.5 % ophthalmic solution Place 2 drops into both eyes daily.      Allergies:   Codeine, Nitrofurantoin macrocrystal, and Sulfa antibiotics   Social History   Tobacco Use  .  Smoking status: Current Every Day Smoker    Packs/day: 0.50    Types: Cigarettes  . Smokeless tobacco: Never Used  Substance Use Topics  . Alcohol use: Yes    Comment: Occas  . Drug use: No     Family Hx: The patient's family history includes Ovarian cancer in her maternal grandmother.  ROS:   Please see the history of present illness.     All other systems reviewed and are negative.   Prior CV studies:   The following studies were reviewed today:    Labs/Other Tests and Data Reviewed:    EKG:  An ECG dated 10/12/2018 was personally reviewed today and demonstrated:  Sinus rhythm with slightly early RS transition in V2 otherwise normal tracing.  No new tracing since then  Recent Labs: No results found for requested labs within last 8760 hours.  07/20/2018 Creatinine 0.62, potassium 4.4 11/10/2019 TSH 2.22 Recent Lipid Panel No results found for: CHOL, TRIG, HDL, CHOLHDL, LDLCALC, LDLDIRECT 11/10/2019 HDL 80, triglycerides 205 Wt Readings from Last 3 Encounters:  02/01/20 106 lb (48.1 kg)  07/24/19 105 lb (47.6 kg)  04/11/19 110 lb (49.9 kg)     Objective:    Vital Signs:  BP (!) 160/67   Pulse 82   Ht 5\' 1"  (1.549 m)   Wt 106 lb (48.1 kg)   BMI 20.03 kg/m    VITAL SIGNS:  reviewed Unable to examine  ASSESSMENT & PLAN:    1. HTN: Blood pressure elevation is relatively mild and inconsistent.  I think taking an extra 12.5 mg of metoprolol during this period of extra anxiety is a great solution.  We will increase the metoprolol succinate to 25 mg once daily, but I am sure she will decrease it back to 12.5 mg once daily when she feels she is under less stress. 2. HLP: Does not want to try any other lipid-lowering agents.  She has had side effects with multiple medications.  COVID-19 Education: The signs and symptoms of COVID-19 were discussed with the patient and how to seek care for testing (follow up with PCP or arrange E-visit).  The importance of social  distancing was discussed today.  Time:   Today, I have spent 14 minutes with the patient with telehealth technology discussing the above problems.     Medication Adjustments/Labs and Tests Ordered: Current medicines are reviewed at length with the patient today.  Concerns regarding medicines are outlined above.   Tests Ordered: No orders of the defined types were placed in this encounter.   Medication Changes: No orders of the defined types were placed in this encounter.   Disposition:  Follow up 3 months  Signed, Sanda Klein, MD  02/01/2020 9:09 AM    [place]

## 2020-02-01 NOTE — Telephone Encounter (Signed)

## 2020-02-05 DIAGNOSIS — R300 Dysuria: Secondary | ICD-10-CM | POA: Diagnosis not present

## 2020-02-05 DIAGNOSIS — N3020 Other chronic cystitis without hematuria: Secondary | ICD-10-CM | POA: Diagnosis not present

## 2020-02-22 DIAGNOSIS — K5730 Diverticulosis of large intestine without perforation or abscess without bleeding: Secondary | ICD-10-CM | POA: Diagnosis not present

## 2020-02-22 DIAGNOSIS — R3129 Other microscopic hematuria: Secondary | ICD-10-CM | POA: Diagnosis not present

## 2020-02-28 DIAGNOSIS — Z961 Presence of intraocular lens: Secondary | ICD-10-CM | POA: Diagnosis not present

## 2020-02-28 DIAGNOSIS — H04123 Dry eye syndrome of bilateral lacrimal glands: Secondary | ICD-10-CM | POA: Diagnosis not present

## 2020-02-28 DIAGNOSIS — H401132 Primary open-angle glaucoma, bilateral, moderate stage: Secondary | ICD-10-CM | POA: Diagnosis not present

## 2020-02-28 DIAGNOSIS — H16223 Keratoconjunctivitis sicca, not specified as Sjogren's, bilateral: Secondary | ICD-10-CM | POA: Diagnosis not present

## 2020-03-13 DIAGNOSIS — N3020 Other chronic cystitis without hematuria: Secondary | ICD-10-CM | POA: Diagnosis not present

## 2020-03-13 DIAGNOSIS — R300 Dysuria: Secondary | ICD-10-CM | POA: Diagnosis not present

## 2020-04-15 DIAGNOSIS — H04123 Dry eye syndrome of bilateral lacrimal glands: Secondary | ICD-10-CM | POA: Diagnosis not present

## 2020-04-15 DIAGNOSIS — H401132 Primary open-angle glaucoma, bilateral, moderate stage: Secondary | ICD-10-CM | POA: Diagnosis not present

## 2020-04-15 DIAGNOSIS — H16223 Keratoconjunctivitis sicca, not specified as Sjogren's, bilateral: Secondary | ICD-10-CM | POA: Diagnosis not present

## 2020-04-15 DIAGNOSIS — R519 Headache, unspecified: Secondary | ICD-10-CM | POA: Diagnosis not present

## 2020-04-16 DIAGNOSIS — M316 Other giant cell arteritis: Secondary | ICD-10-CM | POA: Diagnosis not present

## 2020-04-18 DIAGNOSIS — H66002 Acute suppurative otitis media without spontaneous rupture of ear drum, left ear: Secondary | ICD-10-CM | POA: Diagnosis not present

## 2020-04-18 DIAGNOSIS — M316 Other giant cell arteritis: Secondary | ICD-10-CM | POA: Diagnosis not present

## 2020-04-18 DIAGNOSIS — J019 Acute sinusitis, unspecified: Secondary | ICD-10-CM | POA: Diagnosis not present

## 2020-05-14 DIAGNOSIS — E559 Vitamin D deficiency, unspecified: Secondary | ICD-10-CM | POA: Diagnosis not present

## 2020-05-14 DIAGNOSIS — R7301 Impaired fasting glucose: Secondary | ICD-10-CM | POA: Diagnosis not present

## 2020-05-14 DIAGNOSIS — I10 Essential (primary) hypertension: Secondary | ICD-10-CM | POA: Diagnosis not present

## 2020-05-21 DIAGNOSIS — Z0000 Encounter for general adult medical examination without abnormal findings: Secondary | ICD-10-CM | POA: Diagnosis not present

## 2020-05-21 DIAGNOSIS — Z720 Tobacco use: Secondary | ICD-10-CM | POA: Diagnosis not present

## 2020-07-15 ENCOUNTER — Other Ambulatory Visit: Payer: Self-pay | Admitting: Adult Health

## 2020-07-31 DIAGNOSIS — H04123 Dry eye syndrome of bilateral lacrimal glands: Secondary | ICD-10-CM | POA: Diagnosis not present

## 2020-07-31 DIAGNOSIS — R519 Headache, unspecified: Secondary | ICD-10-CM | POA: Diagnosis not present

## 2020-07-31 DIAGNOSIS — H401132 Primary open-angle glaucoma, bilateral, moderate stage: Secondary | ICD-10-CM | POA: Diagnosis not present

## 2020-08-19 DIAGNOSIS — Z23 Encounter for immunization: Secondary | ICD-10-CM | POA: Diagnosis not present

## 2020-09-27 DIAGNOSIS — M545 Low back pain, unspecified: Secondary | ICD-10-CM | POA: Diagnosis not present

## 2020-09-27 DIAGNOSIS — Z96642 Presence of left artificial hip joint: Secondary | ICD-10-CM | POA: Diagnosis not present

## 2020-09-27 DIAGNOSIS — M5416 Radiculopathy, lumbar region: Secondary | ICD-10-CM | POA: Diagnosis not present

## 2020-11-13 DIAGNOSIS — Z0000 Encounter for general adult medical examination without abnormal findings: Secondary | ICD-10-CM | POA: Diagnosis not present

## 2020-11-13 DIAGNOSIS — R350 Frequency of micturition: Secondary | ICD-10-CM | POA: Diagnosis not present

## 2020-11-13 DIAGNOSIS — R739 Hyperglycemia, unspecified: Secondary | ICD-10-CM | POA: Diagnosis not present

## 2020-11-13 DIAGNOSIS — E782 Mixed hyperlipidemia: Secondary | ICD-10-CM | POA: Diagnosis not present

## 2020-11-13 DIAGNOSIS — I10 Essential (primary) hypertension: Secondary | ICD-10-CM | POA: Diagnosis not present

## 2020-11-13 DIAGNOSIS — E559 Vitamin D deficiency, unspecified: Secondary | ICD-10-CM | POA: Diagnosis not present

## 2020-11-20 DIAGNOSIS — E782 Mixed hyperlipidemia: Secondary | ICD-10-CM | POA: Diagnosis not present

## 2020-11-20 DIAGNOSIS — R739 Hyperglycemia, unspecified: Secondary | ICD-10-CM | POA: Diagnosis not present

## 2020-11-20 DIAGNOSIS — I10 Essential (primary) hypertension: Secondary | ICD-10-CM | POA: Diagnosis not present

## 2020-12-04 DIAGNOSIS — H04123 Dry eye syndrome of bilateral lacrimal glands: Secondary | ICD-10-CM | POA: Diagnosis not present

## 2020-12-04 DIAGNOSIS — H401132 Primary open-angle glaucoma, bilateral, moderate stage: Secondary | ICD-10-CM | POA: Diagnosis not present

## 2020-12-04 DIAGNOSIS — R519 Headache, unspecified: Secondary | ICD-10-CM | POA: Diagnosis not present

## 2020-12-04 DIAGNOSIS — H16223 Keratoconjunctivitis sicca, not specified as Sjogren's, bilateral: Secondary | ICD-10-CM | POA: Diagnosis not present

## 2021-02-26 ENCOUNTER — Other Ambulatory Visit: Payer: Self-pay

## 2021-02-26 ENCOUNTER — Ambulatory Visit: Payer: Medicare Other | Admitting: Cardiovascular Disease

## 2021-02-26 ENCOUNTER — Encounter: Payer: Self-pay | Admitting: Cardiovascular Disease

## 2021-02-26 VITALS — BP 150/72 | HR 61 | Ht 61.0 in | Wt 107.8 lb

## 2021-02-26 DIAGNOSIS — E782 Mixed hyperlipidemia: Secondary | ICD-10-CM | POA: Diagnosis not present

## 2021-02-26 DIAGNOSIS — I10 Essential (primary) hypertension: Secondary | ICD-10-CM | POA: Diagnosis not present

## 2021-02-26 MED ORDER — METOPROLOL SUCCINATE ER 25 MG PO TB24: 25.0000 mg | ORAL_TABLET | Freq: Every day | ORAL | 3 refills | Status: DC

## 2021-02-26 MED ORDER — AMLODIPINE BESYLATE 2.5 MG PO TABS: 2.5000 mg | ORAL_TABLET | Freq: Every day | ORAL | 3 refills | Status: DC

## 2021-02-26 MED ORDER — IRBESARTAN 75 MG PO TABS: 75.0000 mg | ORAL_TABLET | Freq: Every day | ORAL | 3 refills | Status: DC

## 2021-02-26 NOTE — Patient Instructions (Addendum)
Medication Instructions:  DECREASE the Amlodipine to 2.5 mg once daily  *If you need a refill on your cardiac medications before your next appointment, please call your pharmacy*   Lab Work: None ordered If you have labs (blood work) drawn today and your tests are completely normal, you will receive your results only by: [name] MyChart Message (if you have MyChart) OR . A paper copy in the mail If you have any lab test that is abnormal or we need to change your treatment, we will call you to review the results.   Testing/Procedures: None ordered   Follow-Up: At [facility], you and your health needs are our priority.  As part of our continuing mission to provide you with exceptional heart care, we have created designated Provider Care Teams.  These Care Teams include your primary Cardiologist (physician) and Advanced Practice Providers (APPs -  Physician Assistants and Nurse Practitioners) who all work together to provide you with the care you need, when you need it.  We recommend signing up for the patient portal called "MyChart".  Sign up information is provided on this After Visit Summary.  MyChart is used to connect with patients for Virtual Visits (Telemedicine).  Patients are able to view lab/test results, encounter notes, upcoming appointments, etc.  Non-urgent messages can be sent to your provider as well.   To learn more about what you can do with MyChart, go to [email or web address].    Your next appointment:   12 month(s)  The format for your next appointment:   In Person  Provider:   You may see Sanda Klein, MD or one of the following Advanced Practice Providers on your designated Care Team:    Almyra Deforest, PA-C  Fabian Sharp, PA-C or   Roby Lofts, [place]

## 2021-02-26 NOTE — Progress Notes (Signed)
Cardiology office Note    Date:  02/27/2021   ID:  Kara Campos, DOB 09/17/1936, MRN [id number]  PCP:  Kara Gravel, MD  Cardiologist:  Sanda Klein, MD  Electrophysiologist:  None   Evaluation Performed:  Follow-Up Visit  Chief Complaint:  HTN  History of Present Illness:    Kara Campos is a 85 y.o. female with longstanding mild essential hypertension, that has been difficult to treat due to numerous side effects, with frequent adjustments in her medical regimen.  She is generally doing well, but recently has been under emotional stress after her husband's diagnosis of lung cancer.  Many times for blood pressure is very well controlled and that he December 22 office visit her blood pressure was 112/70.  Today her blood pressure is a little high and this still happens erratically.  She is only tolerated low doses of beta-blocker due to fatigue.  Had trouble with higher doses of amlodipine due to edema and nausea, so she has already cut it back to 2.5 mg once daily..  She has a thiazide allergy.  Tried benazepril but it did not really help.  She cannot take statins due to muscle side effects, even when she takes them with coq.10.   Past Medical History:  Diagnosis Date  . Arthritis   . Blood transfusion   . Cataract   . Diabetes mellitus    borderline  . Hemorrhoids   . Hyperlipidemia   . Osteoporosis    Past Surgical History:  Procedure Laterality Date  . TOTAL HIP ARTHROPLASTY Left 02/13/2017   Procedure: TOTAL HIP ARTHROPLASTY ANTERIOR APPROACH;  Surgeon: Paralee Cancel, MD;  Location: [facility];  Service: Orthopedics;  Laterality: Left;  [name] VAGINAL HYSTERECTOMY  1985     Current Meds  Medication Sig  . Cyanocobalamin (VITAMIN B-12 SL) Place 1 tablet under the tongue daily.  . fish oil-omega-3 fatty acids 1000 MG capsule Take 2 g by mouth as directed.   . Lactobacillus (PROBIOTIC ACIDOPHILUS PO) Take by mouth.  . nortriptyline (PAMELOR) 10 MG capsule Take 10 mg by  mouth at bedtime.  . timolol (BETIMOL) 0.5 % ophthalmic solution Place 2 drops into both eyes daily.   . [DISCONTINUED] amLODipine (NORVASC) 2.5 MG tablet TAKE ONE TABLET BY MOUTH ONE TIME DAILY  . [DISCONTINUED] irbesartan (AVAPRO) 75 MG tablet TAKE ONE TABLET BY MOUTH DAILY AT BEDTIME  . [DISCONTINUED] metoprolol succinate (TOPROL-XL) 25 MG 24 hr tablet Take 1 tablet (25 mg total) by mouth daily.     Allergies:   Codeine, Nitrofurantoin macrocrystal, and Sulfa antibiotics   Social History   Tobacco Use  . Smoking status: Current Every Day Smoker    Packs/day: 0.50    Types: Cigarettes  . Smokeless tobacco: Never Used  Vaping Use  . Vaping Use: Never used  Substance Use Topics  . Alcohol use: Yes    Comment: Occas  . Drug use: No     Family Hx: The patient's family history includes Ovarian cancer in her maternal grandmother.  ROS:   Please see the history of present illness.     All other systems reviewed and are negative.   Prior CV studies:   The following studies were reviewed today:    Labs/Other Tests and Data Reviewed:    EKG:   Recent Labs: No results found for requested labs within last 8760 hours.  07/20/2018  Creatinine 0.62, potassium 4.4 11/10/2019  TSH 2.22 11/13/2020 TSH 2.89, creatinine 0.6, hemoglobin A1c 5.9%  Recent Lipid Panel No results found for: CHOL, TRIG, HDL, CHOLHDL, LDLCALC, LDLDIRECT 11/10/2019 HDL 80, triglycerides 205 11/13/2020 cholesterol 249, HDL 82, LDL 130, triglycerides 213  Wt Readings from Last 3 Encounters:  02/26/21 107 lb 12.8 oz (48.9 kg)  02/01/20 106 lb (48.1 kg)  07/24/19 105 lb (47.6 kg)     Objective:    Vital Signs:  BP (!) 150/72 (BP Location: Left Arm, Patient Position: Sitting)   Pulse 61   Ht 5\' 1"  (1.549 m)   Wt 107 lb 12.8 oz (48.9 kg)   SpO2 98%   BMI 20.37 kg/m     General: Alert, oriented x3, no distress, very lean Head: no evidence of trauma, PERRL, EOMI, no exophtalmos or lid lag, no  myxedema, no xanthelasma; normal ears, nose and oropharynx Neck: normal jugular venous pulsations and no hepatojugular reflux; brisk carotid pulses without delay and no carotid bruits Chest: clear to auscultation, no signs of consolidation by percussion or palpation, normal fremitus, symmetrical and full respiratory excursions Cardiovascular: normal position and quality of the apical impulse, regular rhythm, normal first and second heart sounds, no murmurs, rubs or gallops Abdomen: no tenderness or distention, no masses by palpation, no abnormal pulsatility or arterial bruits, normal bowel sounds, no hepatosplenomegaly Extremities: no clubbing, cyanosis or edema; 2+ radial, ulnar and brachial pulses bilaterally; 2+ right femoral, posterior tibial and dorsalis pedis pulses; 2+ left femoral, posterior tibial and dorsalis pedis pulses; no subclavian or femoral bruits Neurological: grossly nonfocal Psych: Normal mood and affect   ASSESSMENT & PLAN:    1. HTN: Think we can continue with a general plan of metoprolol succinate 12.5 mg daily, with the ability to take a double dose on days when she is under additional emotional stress. 2. HLP: She is already very lean and eats a healthy diet.  BMI is only 20.  Does not want to try any other lipid-lowering agents.  She has had side effects with multiple medications.  Medication Changes: Meds ordered this encounter  Medications  . amLODipine (NORVASC) 2.5 MG tablet    Sig: Take 1 tablet (2.5 mg total) by mouth daily.    Dispense:  90 tablet    Refill:  3  . irbesartan (AVAPRO) 75 MG tablet    Sig: Take 1 tablet (75 mg total) by mouth at bedtime.    Dispense:  90 tablet    Refill:  3  . metoprolol succinate (TOPROL-XL) 25 MG 24 hr tablet    Sig: Take 1 tablet (25 mg total) by mouth daily.    Dispense:  90 tablet    Refill:  3   Patient Instructions  Medication Instructions:  DECREASE the Amlodipine to 2.5 mg once daily  *If you need a refill  on your cardiac medications before your next appointment, please call your pharmacy*   Lab Work: None ordered If you have labs (blood work) drawn today and your tests are completely normal, you will receive your results only by: [name] MyChart Message (if you have MyChart) OR . A paper copy in the mail If you have any lab test that is abnormal or we need to change your treatment, we will call you to review the results.   Testing/Procedures: None ordered   Follow-Up: At [facility], you and your health needs are our priority.  As part of our continuing mission to provide you with exceptional heart care, we have created designated Provider Care Teams.  These Care Teams include your primary Cardiologist (physician)  and Advanced Practice Providers (APPs -  Physician Assistants and Nurse Practitioners) who all work together to provide you with the care you need, when you need it.  We recommend signing up for the patient portal called "MyChart".  Sign up information is provided on this After Visit Summary.  MyChart is used to connect with patients for Virtual Visits (Telemedicine).  Patients are able to view lab/test results, encounter notes, upcoming appointments, etc.  Non-urgent messages can be sent to your provider as well.   To learn more about what you can do with MyChart, go to [email or web address].    Your next appointment:   12 month(s)  The format for your next appointment:   In Person  Provider:   You may see Sanda Klein, MD or one of the following Advanced Practice Providers on your designated Care Team:    Almyra Deforest, PA-C  Fabian Sharp, [place] or   Roby Lofts, PA-C     Signed, Sanda Klein, MD  02/27/2021 5:03 PM    [place]

## 2021-02-27 ENCOUNTER — Encounter: Payer: Self-pay | Admitting: Cardiovascular Disease

## 2021-04-07 DIAGNOSIS — G43809 Other migraine, not intractable, without status migrainosus: Secondary | ICD-10-CM | POA: Diagnosis not present

## 2021-04-07 DIAGNOSIS — Z961 Presence of intraocular lens: Secondary | ICD-10-CM | POA: Diagnosis not present

## 2021-04-07 DIAGNOSIS — H16223 Keratoconjunctivitis sicca, not specified as Sjogren's, bilateral: Secondary | ICD-10-CM | POA: Diagnosis not present

## 2021-04-07 DIAGNOSIS — H401132 Primary open-angle glaucoma, bilateral, moderate stage: Secondary | ICD-10-CM | POA: Diagnosis not present

## 2021-05-20 DIAGNOSIS — R739 Hyperglycemia, unspecified: Secondary | ICD-10-CM | POA: Diagnosis not present

## 2021-05-20 DIAGNOSIS — I10 Essential (primary) hypertension: Secondary | ICD-10-CM | POA: Diagnosis not present

## 2021-05-20 DIAGNOSIS — E7800 Pure hypercholesterolemia, unspecified: Secondary | ICD-10-CM | POA: Diagnosis not present

## 2021-05-27 DIAGNOSIS — E559 Vitamin D deficiency, unspecified: Secondary | ICD-10-CM | POA: Diagnosis not present

## 2021-05-27 DIAGNOSIS — I10 Essential (primary) hypertension: Secondary | ICD-10-CM | POA: Diagnosis not present

## 2021-05-27 DIAGNOSIS — R7303 Prediabetes: Secondary | ICD-10-CM | POA: Diagnosis not present

## 2021-05-27 DIAGNOSIS — E782 Mixed hyperlipidemia: Secondary | ICD-10-CM | POA: Diagnosis not present

## 2021-05-27 DIAGNOSIS — M5136 Other intervertebral disc degeneration, lumbar region: Secondary | ICD-10-CM | POA: Diagnosis not present

## 2021-05-27 DIAGNOSIS — H409 Unspecified glaucoma: Secondary | ICD-10-CM | POA: Diagnosis not present

## 2021-05-27 DIAGNOSIS — Z0000 Encounter for general adult medical examination without abnormal findings: Secondary | ICD-10-CM | POA: Diagnosis not present

## 2021-05-27 DIAGNOSIS — L309 Dermatitis, unspecified: Secondary | ICD-10-CM | POA: Diagnosis not present

## 2021-05-27 DIAGNOSIS — M797 Fibromyalgia: Secondary | ICD-10-CM | POA: Diagnosis not present

## 2021-05-27 DIAGNOSIS — F17200 Nicotine dependence, unspecified, uncomplicated: Secondary | ICD-10-CM | POA: Diagnosis not present

## 2021-09-05 DIAGNOSIS — Z23 Encounter for immunization: Secondary | ICD-10-CM | POA: Diagnosis not present

## 2021-10-01 DIAGNOSIS — H04123 Dry eye syndrome of bilateral lacrimal glands: Secondary | ICD-10-CM | POA: Diagnosis not present

## 2021-10-01 DIAGNOSIS — Z961 Presence of intraocular lens: Secondary | ICD-10-CM | POA: Diagnosis not present

## 2021-10-01 DIAGNOSIS — H401132 Primary open-angle glaucoma, bilateral, moderate stage: Secondary | ICD-10-CM | POA: Diagnosis not present

## 2021-10-09 DIAGNOSIS — M7062 Trochanteric bursitis, left hip: Secondary | ICD-10-CM | POA: Diagnosis not present

## 2021-10-09 DIAGNOSIS — M25552 Pain in left hip: Secondary | ICD-10-CM | POA: Diagnosis not present

## 2021-11-26 DIAGNOSIS — I10 Essential (primary) hypertension: Secondary | ICD-10-CM | POA: Diagnosis not present

## 2021-11-26 DIAGNOSIS — E559 Vitamin D deficiency, unspecified: Secondary | ICD-10-CM | POA: Diagnosis not present

## 2022-03-24 ENCOUNTER — Ambulatory Visit: Payer: Medicare Other | Admitting: Cardiovascular Disease

## 2022-03-24 ENCOUNTER — Encounter: Payer: Self-pay | Admitting: Cardiovascular Disease

## 2022-03-24 VITALS — BP 132/79 | HR 71 | Ht 61.0 in | Wt 100.0 lb

## 2022-03-24 DIAGNOSIS — I10 Essential (primary) hypertension: Secondary | ICD-10-CM

## 2022-03-24 MED ORDER — METOPROLOL SUCCINATE ER 25 MG PO TB24: 25.0000 mg | ORAL_TABLET | Freq: Every day | ORAL | 3 refills | Status: DC

## 2022-03-24 MED ORDER — AMLODIPINE BESYLATE 2.5 MG PO TABS: 2.5000 mg | ORAL_TABLET | Freq: Every day | ORAL | 3 refills | Status: DC

## 2022-03-24 MED ORDER — IRBESARTAN 75 MG PO TABS: 75.0000 mg | ORAL_TABLET | Freq: Every day | ORAL | 3 refills | Status: DC

## 2022-03-24 NOTE — Progress Notes (Signed)
? ?Cardiology office Note  ? ? ?Date:  03/26/2022  ? ?ID:  Kara Campos, DOB [birth year], MRN [id number] ? ?PCP:  [name], MD  ?Cardiologist:  Sanda Klein, MD  ?Electrophysiologist:  None  ? ?Evaluation Performed:  Follow-Up Visit ? ?Chief Complaint:  HTN ? ?History of Present Illness:   ? ?Kara Campos is a 86 y.o. female with longstanding mild essential hypertension, that has been difficult to treat due to numerous side effects, with frequent adjustments in her medical regimen. ? ?She has not had serious health problems, but has been under emotional stress since her husband passed away in 2021/08/13 from lung cancer.  She is still tearful.  She is finally getting a handle on managing his estate. ? ?She has lost another 6 or 7 pounds.  She is very lean/borderline underweight with a BMI just under 19. ? ?She has had some episodes of dizziness when she stands up abruptly consistent with orthostatic hypotension.  Her blood pressure was quite high when she checked into the office today, but when I rechecked it it was in desirable range.  In January at her PCP appointment her blood pressure is 122/64. ? ?The patient specifically denies any chest pain at rest exertion, dyspnea at rest or with exertion, orthopnea, paroxysmal nocturnal dyspnea, syncope, palpitations, focal neurological deficits, intermittent claudication, lower extremity edema, unexplained weight gain, cough, hemoptysis or wheezing. ? ?She has only tolerated low doses of beta-blocker due to fatigue.  She only takes the metoprolol succinate 12.5 mg "sometimes" when her blood pressure is elevated had trouble with higher doses of amlodipine due to edema and nausea, so taking only 2.5 mg once daily..  She has a thiazide allergy.  Tried benazepril but it did not really help. ? ?She cannot take statins due to muscle side effects, even when she takes them with coq.10. ? ? ?Past Medical History:  ?Diagnosis Date  ? Arthritis   ? Blood transfusion   ?  Cataract   ? Diabetes mellitus   ? borderline  ? Hemorrhoids   ? Hyperlipidemia   ? Osteoporosis   ? ?Past Surgical History:  ?Procedure Laterality Date  ? TOTAL HIP ARTHROPLASTY Left 02/13/2017  ? Procedure: TOTAL HIP ARTHROPLASTY ANTERIOR APPROACH;  Surgeon: Paralee Cancel, MD;  Location: [facility];  Service: Orthopedics;  Laterality: Left;  ? VAGINAL HYSTERECTOMY  1985  ?  ? ?Current Meds  ?Medication Sig  ? Cyanocobalamin (VITAMIN B-12 SL) Place 1 tablet under the tongue daily.  ? fish oil-omega-3 fatty acids 1000 MG capsule Take 2 g by mouth as directed.   ? Lactobacillus (PROBIOTIC ACIDOPHILUS PO) Take by mouth.  ? nortriptyline (PAMELOR) 10 MG capsule Take 10 mg by mouth at bedtime.  ? timolol (BETIMOL) 0.5 % ophthalmic solution Place 2 drops into both eyes daily.   ? [DISCONTINUED] amLODipine (NORVASC) 2.5 MG tablet Take 1 tablet (2.5 mg total) by mouth daily.  ? [DISCONTINUED] irbesartan (AVAPRO) 75 MG tablet Take 1 tablet (75 mg total) by mouth at bedtime.  ? [DISCONTINUED] metoprolol succinate (TOPROL-XL) 25 MG 24 hr tablet Take 1 tablet (25 mg total) by mouth daily.  ?  ? ?Allergies:   Codeine, Nitrofurantoin macrocrystal, and Sulfa antibiotics  ? ?Social History  ? ?Tobacco Use  ? Smoking status: Every Day  ?  Packs/day: 0.50  ?  Types: Cigarettes  ? Smokeless tobacco: Never  ?Vaping Use  ? Vaping Use: Never used  ?Substance Use Topics  ? Alcohol use: Yes  ?  Comment: Occas  ? Drug use: No  ?  ? ?Family Hx: ?The patient's family history includes Ovarian cancer in her maternal grandmother. ? ?ROS:   ?Please see the history of present illness.    ? ?All other systems reviewed and are negative. ? ? ?Prior CV studies:   ?The following studies were reviewed today: ? ? ? ?Labs/Other Tests and Data Reviewed:   ? ?EKG:   ?Recent Labs: ?No results found for requested labs within last 8760 hours.  ?07/20/2018  Creatinine 0.62, potassium 4.4 ?11/10/2019  TSH 2.22 ?11/13/2020 TSH 2.89, creatinine 0.6, hemoglobin A1c  5.9% ?Recent Lipid Panel ?No results found for: CHOL, TRIG, HDL, CHOLHDL, LDLCALC, LDLDIRECT ?11/10/2019 HDL 80, triglycerides 205 ?11/13/2020 cholesterol 249, HDL 82, LDL 130, triglycerides 213 ?05/20/2021 cholesterol 225, HDL 73, triglycerides 166 ? ?Wt Readings from Last 3 Encounters:  ?03/24/22 100 lb (45.4 kg)  ?02/26/21 107 lb 12.8 oz (48.9 kg)  ?02/01/20 106 lb (48.1 kg)  ?  ? ?Objective:   ? ?Vital Signs:  BP 132/79   Pulse 71   Ht '5\' 1"'$  (1.549 m)   Wt 100 lb (45.4 kg)   SpO2 97%   BMI 18.89 kg/m?   ? ? ?General: Alert, oriented x3, no distress, very lean ?Head: no evidence of trauma, PERRL, EOMI, no exophtalmos or lid lag, no myxedema, no xanthelasma; normal ears, nose and oropharynx ?Neck: normal jugular venous pulsations and no hepatojugular reflux; brisk carotid pulses without delay and no carotid bruits ?Chest: clear to auscultation, no signs of consolidation by percussion or palpation, normal fremitus, symmetrical and full respiratory excursions ?Cardiovascular: normal position and quality of the apical impulse, regular rhythm, normal first and second heart sounds, no murmurs, rubs or gallops ?Abdomen: no tenderness or distention, no masses by palpation, no abnormal pulsatility or arterial bruits, normal bowel sounds, no hepatosplenomegaly ?Extremities: no clubbing, cyanosis or edema; 2+ radial, ulnar and brachial pulses bilaterally; 2+ right femoral, posterior tibial and dorsalis pedis pulses; 2+ left femoral, posterior tibial and dorsalis pedis pulses; no subclavian or femoral bruits ?Neurological: grossly nonfocal ?Psych: Normal mood and affect ? ? ? ?ASSESSMENT & PLAN:   ? ?1. HTN: It seems that her blood pressure is usually well controlled and she can take the metoprolol when she is under extra emotional stress and her blood pressure is high.  She is describing some orthostatic hypotension so I do not think we should increase her daily dose of medications. ?2. HLP: She is lost additional  weight.  She does not have known CAD or PAD.  Has an excellent HDL.  She does not want to try statins again due to side effects with multiple medications. ?3. Grief: Understandable, but now a little more than 6 months since her husband passed away.  We discussed joining a bereavement group or seeking counseling.  She reports good support from family and friends and is not isolating herself. ? ?Medication Changes: ?Meds ordered this encounter  ?Medications  ? amLODipine (NORVASC) 2.5 MG tablet  ?  Sig: Take 1 tablet (2.5 mg total) by mouth daily.  ?  Dispense:  90 tablet  ?  Refill:  3  ? irbesartan (AVAPRO) 75 MG tablet  ?  Sig: Take 1 tablet (75 mg total) by mouth at bedtime.  ?  Dispense:  90 tablet  ?  Refill:  3  ? metoprolol succinate (TOPROL-XL) 25 MG 24 hr tablet  ?  Sig: Take 1 tablet (25 mg total) by mouth daily.  ?  Dispense:  90 tablet  ?  Refill:  3  ? ?Patient Instructions  ?Medication Instructions:  ?No changes ?*If you need a refill on your cardiac medications before your next appointment, please call your pharmacy* ? ? ?Lab Work: ?None ordered ?If you have labs (blood work) drawn today and your tests are completely normal, you will receive your results only by: ?MyChart Message (if you have MyChart) OR ?A paper copy in the mail ?If you have any lab test that is abnormal or we need to change your treatment, we will call you to review the results. ? ? ?Testing/Procedures: ?None ordered ? ? ?Follow-Up: ?At [facility], you and your health needs are our priority.  As part of our continuing mission to provide you with exceptional heart care, we have created designated Provider Care Teams.  These Care Teams include your primary Cardiologist (physician) and Advanced Practice Providers (APPs -  Physician Assistants and Nurse Practitioners) who all work together to provide you with the care you need, when you need it. ? ?We recommend signing up for the patient portal called "MyChart".  Sign up information  is provided on this After Visit Summary.  MyChart is used to connect with patients for Virtual Visits (Telemedicine).  Patients are able to view lab/test results, encounter notes, upcoming appointments, etc.  Non-urgen

## 2022-03-24 NOTE — Patient Instructions (Signed)
Medication Instructions:  ?No changes ?*If you need a refill on your cardiac medications before your next appointment, please call your pharmacy* ? ? ?Lab Work: ?None ordered ?If you have labs (blood work) drawn today and your tests are completely normal, you will receive your results only by: ?MyChart Message (if you have MyChart) OR ?A paper copy in the mail ?If you have any lab test that is abnormal or we need to change your treatment, we will call you to review the results. ? ? ?Testing/Procedures: ?None ordered ? ? ?Follow-Up: ?At [facility], you and your health needs are our priority.  As part of our continuing mission to provide you with exceptional heart care, we have created designated Provider Care Teams.  These Care Teams include your primary Cardiologist (physician) and Advanced Practice Providers (APPs -  Physician Assistants and Nurse Practitioners) who all work together to provide you with the care you need, when you need it. ? ?We recommend signing up for the patient portal called "MyChart".  Sign up information is provided on this After Visit Summary.  MyChart is used to connect with patients for Virtual Visits (Telemedicine).  Patients are able to view lab/test results, encounter notes, upcoming appointments, etc.  Non-urgent messages can be sent to your provider as well.   ?To learn more about what you can do with MyChart, go to [email or web address].   ? ?Your next appointment:   ?12 month(s) ? ?The format for your next appointment:   ?In Person ? ?Provider:   ?Sanda Klein, MD { ? ? ?Important Information About Sugar ? ? ? ? ? ?e ?

## 2022-03-26 ENCOUNTER — Encounter: Payer: Self-pay | Admitting: Cardiovascular Disease

## 2022-08-11 DIAGNOSIS — M545 Low back pain, unspecified: Secondary | ICD-10-CM | POA: Diagnosis not present

## 2022-12-01 DIAGNOSIS — H401132 Primary open-angle glaucoma, bilateral, moderate stage: Secondary | ICD-10-CM | POA: Diagnosis not present

## 2022-12-01 DIAGNOSIS — Z961 Presence of intraocular lens: Secondary | ICD-10-CM | POA: Diagnosis not present

## 2022-12-01 DIAGNOSIS — G43019 Migraine without aura, intractable, without status migrainosus: Secondary | ICD-10-CM | POA: Diagnosis not present

## 2022-12-01 DIAGNOSIS — H04123 Dry eye syndrome of bilateral lacrimal glands: Secondary | ICD-10-CM | POA: Diagnosis not present

## 2022-12-16 DIAGNOSIS — R7303 Prediabetes: Secondary | ICD-10-CM | POA: Diagnosis not present

## 2022-12-16 DIAGNOSIS — E782 Mixed hyperlipidemia: Secondary | ICD-10-CM | POA: Diagnosis not present

## 2022-12-16 DIAGNOSIS — K219 Gastro-esophageal reflux disease without esophagitis: Secondary | ICD-10-CM | POA: Diagnosis not present

## 2022-12-16 DIAGNOSIS — R42 Dizziness and giddiness: Secondary | ICD-10-CM | POA: Diagnosis not present

## 2022-12-16 DIAGNOSIS — I10 Essential (primary) hypertension: Secondary | ICD-10-CM | POA: Diagnosis not present

## 2022-12-23 DIAGNOSIS — I7300 Raynaud's syndrome without gangrene: Secondary | ICD-10-CM | POA: Diagnosis not present

## 2022-12-23 DIAGNOSIS — K589 Irritable bowel syndrome without diarrhea: Secondary | ICD-10-CM | POA: Diagnosis not present

## 2022-12-23 DIAGNOSIS — L309 Dermatitis, unspecified: Secondary | ICD-10-CM | POA: Diagnosis not present

## 2022-12-23 DIAGNOSIS — E782 Mixed hyperlipidemia: Secondary | ICD-10-CM | POA: Diagnosis not present

## 2022-12-23 DIAGNOSIS — K219 Gastro-esophageal reflux disease without esophagitis: Secondary | ICD-10-CM | POA: Diagnosis not present

## 2022-12-23 DIAGNOSIS — I10 Essential (primary) hypertension: Secondary | ICD-10-CM | POA: Diagnosis not present

## 2022-12-23 DIAGNOSIS — R7303 Prediabetes: Secondary | ICD-10-CM | POA: Diagnosis not present

## 2022-12-23 DIAGNOSIS — R7989 Other specified abnormal findings of blood chemistry: Secondary | ICD-10-CM | POA: Diagnosis not present

## 2022-12-23 DIAGNOSIS — H409 Unspecified glaucoma: Secondary | ICD-10-CM | POA: Diagnosis not present

## 2022-12-23 DIAGNOSIS — E559 Vitamin D deficiency, unspecified: Secondary | ICD-10-CM | POA: Diagnosis not present

## 2023-01-06 ENCOUNTER — Other Ambulatory Visit (HOSPITAL_COMMUNITY): Payer: Self-pay | Admitting: Family Medicine

## 2023-01-06 DIAGNOSIS — Z122 Encounter for screening for malignant neoplasm of respiratory organs: Secondary | ICD-10-CM

## 2023-01-22 DIAGNOSIS — R21 Rash and other nonspecific skin eruption: Secondary | ICD-10-CM | POA: Diagnosis not present

## 2023-01-29 ENCOUNTER — Other Ambulatory Visit: Payer: Self-pay | Admitting: Internal Medicine

## 2023-01-29 DIAGNOSIS — F17200 Nicotine dependence, unspecified, uncomplicated: Secondary | ICD-10-CM

## 2023-01-29 DIAGNOSIS — Z87891 Personal history of nicotine dependence: Secondary | ICD-10-CM

## 2023-03-17 ENCOUNTER — Other Ambulatory Visit: Payer: Self-pay | Admitting: Cardiovascular Disease

## 2023-03-24 NOTE — Progress Notes (Signed)
Cardiology Clinic Note   Patient Name: Kara Campos Date of Encounter: 03/26/2023  Primary Care Provider:  Irena Reichmann, DO Primary Cardiologist:  Thurmon Fair, MD  Patient Profile    87 year old female with history of longstanding essential hypertension which has been difficult to control due to numerous side effects to medications, and frequent medication adjustments.  She is unable to tolerate beta-blockers due to significant fatigue however she was taking metoprolol 12.5 mg on occasion when her blood pressure was elevated.  She was unable to take amlodipine due to edema and nausea at higher doses and therefore was on the lowest dose of 2.5 mg daily.  She also complained of symptoms of orthostatic hypotension and therefore titration of medications also was difficult.  She continues to have some emotional issues grieving the loss of her husband in 2022 from lung cancer.  Other history includes hyperlipidemia and Type II Diabetes.  Past Medical History    Past Medical History:  Diagnosis Date   Arthritis    Blood transfusion    Cataract    Diabetes mellitus    borderline   Hemorrhoids    Hyperlipidemia    Osteoporosis    Past Surgical History:  Procedure Laterality Date   TOTAL HIP ARTHROPLASTY Left 02/13/2017   Procedure: TOTAL HIP ARTHROPLASTY ANTERIOR APPROACH;  Surgeon: Durene Romans, MD;  Location: [facility];  Service: Orthopedics;  Laterality: Left;   VAGINAL HYSTERECTOMY  1985    Allergies  Allergies  Allergen Reactions   Codeine Nausea Only   Nitrofurantoin Macrocrystal Nausea Only   Sulfa Antibiotics Nausea And Vomiting    History of Present Illness    Mrs. Sayed presents today for ongoing assessment and management of hypertension and hyperlipidemia.  Last seen in the office by Dr. Royann Shivers on 03/26/2022.  Unable to titrate medications due to side effects, and symptoms of orthostatic hypotension.  Blood pressure on last office visit was 132/79.  Today she  complains of elevated blood pressure but related to home stress.  She has hired someone still a large amount of money from her recently, as well as some other stressors which are keeping her very anxious and contributing to hypertension.  She denies any chest pain, dyspnea on her surgeon, or dizziness.  She is medically compliant.  Home Medications    Current Outpatient Medications  Medication Sig Dispense Refill   co-enzyme Q-10 30 MG capsule Take 50 mg by mouth once a week.     Cyanocobalamin (VITAMIN B-12 SL) Place 1 tablet under the tongue daily.     fish oil-omega-3 fatty acids 1000 MG capsule Take 2 g by mouth as directed.      Lactobacillus (PROBIOTIC ACIDOPHILUS PO) Take by mouth.     magnesium gluconate (MAGONATE) 500 MG tablet Take 500 mg by mouth daily.     nortriptyline (PAMELOR) 10 MG capsule Take 10 mg by mouth at bedtime.     timolol (BETIMOL) 0.5 % ophthalmic solution Place 2 drops into both eyes daily.      amLODipine (NORVASC) 2.5 MG tablet Take 1 tablet (2.5 mg total) by mouth daily. 90 tablet 3   irbesartan (AVAPRO) 75 MG tablet Take 1 tablet (75 mg total) by mouth at bedtime. 90 tablet 3   metoprolol succinate (TOPROL-XL) 25 MG 24 hr tablet Take 1 tablet (25 mg total) by mouth daily. 90 tablet 3   No current facility-administered medications for this visit.     Family History    Family History  Problem Relation Age of Onset   Ovarian cancer Maternal Grandmother    She indicated that her mother is deceased. She indicated that her father is deceased. She indicated that the status of her maternal grandmother is unknown.  Social History    Social History   Socioeconomic History   Marital status: Married    Spouse name: Not on file   Number of children: Not on file   Years of education: Not on file   Highest education level: Not on file  Occupational History   Not on file  Tobacco Use   Smoking status: Every Day    Packs/day: .5    Types: Cigarettes    Smokeless tobacco: Never  Vaping Use   Vaping Use: Never used  Substance and Sexual Activity   Alcohol use: Yes    Comment: Occas   Drug use: No   Sexual activity: Yes    Birth control/protection: Post-menopausal, Surgical    Comment: HYST-1st intercourse 64 yo-1 partner  Other Topics Concern   Not on file  Social History Narrative   Not on file   Social Determinants of Health   Financial Resource Strain: Not on file  Food Insecurity: Not on file  Transportation Needs: Not on file  Physical Activity: Not on file  Stress: Not on file  Social Connections: Not on file  Intimate Partner Violence: Not on file     Review of Systems    General:  No chills, fever, night sweats or weight changes.  Cardiovascular:  No chest pain, dyspnea on exertion, edema, orthopnea, palpitations, paroxysmal nocturnal dyspnea. Dermatological: No rash, lesions/masses Respiratory: No cough, dyspnea Urologic: No hematuria, dysuria Abdominal:   No nausea, vomiting, diarrhea, bright red blood per rectum, melena, or hematemesis Neurologic:  No visual changes, wkns, changes in mental status.  Admits to increased anxiety All other systems reviewed and are otherwise negative except as noted above.     Physical Exam    VS:  BP 132/62   Pulse 66   Ht 5' (1.524 m)   Wt 100 lb 6.4 oz (45.5 kg)   SpO2 99%   BMI 19.61 kg/m  , BMI Body mass index is 19.61 kg/m.     GEN: Well nourished, well developed, in no acute distress. HEENT: normal. Neck: Supple, no JVD, carotid bruits, or masses. Cardiac: RRR, no murmurs, rubs, or gallops. No clubbing, cyanosis, edema.  Radials/DP/PT 2+ and equal bilaterally.  Respiratory:  Respirations regular and unlabored, clear to auscultation bilaterally. GI: Soft, nontender, nondistended, BS + x 4. MS: no deformity or atrophy. Skin: warm and dry, no rash. Neuro:  Strength and sensation are intact. Psych: Normal affect.  Accessory Clinical Findings    ECG personally  reviewed by me today-normal sinus rhythm, heart rate 66 bpm.  Lab Results  Component Value Date   WBC 8.5 12/07/2017   HGB 14.1 12/07/2017   HCT 41.7 12/07/2017   MCV 98.3 12/07/2017   PLT 270 12/07/2017   Lab Results  Component Value Date   CREATININE 0.62 07/20/2018   BUN 11 07/20/2018   NA 138 07/20/2018   K 4.4 07/20/2018   CL 100 07/20/2018   CO2 21 07/20/2018   Lab Results  Component Value Date   ALT 17 02/13/2017   AST 24 02/13/2017   ALKPHOS 34 (L) 02/13/2017   BILITOT 1.0 02/13/2017   No results found for: "CHOL", "HDL", "LDLCALC", "LDLDIRECT", "TRIG", "CHOLHDL"  No results found for: "HGBA1C"  Review of Prior Studies:  Assessment & Plan   1.  Hypertension: Initially on arrival blood pressure was elevated at 140/90, however after a few minutes in the exam room I rechecked it and had had decreased to 132/62.  I will not make any changes on her medication regimen and she will continue on amlodipine 2.5 mg daily, irbesartan 75 mg daily and metoprolol 25 mg at at bedtime.  She states that she was only taking 12.5 mg at first because she was unable to tolerate it but now with elevated blood pressure at home she decided to go up on the dose to the full 25 mg as prescribed by Dr. Royann Shivers.  She is tolerating this well.  Continue this regimen  2.  Situational stress and anxiety: Under significant stress currently.  I have recommended psychological counseling and/or grief counseling as an option to help her through these issues.  She said she is interested but right now she does not have the time to devote to it.  I have advised her if she would like a referral to let us know or to speak with her PCP.     3.  Hyperlipidemia: Currently diet controlled and on omega-3 fatty acid capsules only.  Labs are followed by PCP.  Signed, Bettey Mare. Liborio Nixon, ANP, AACC   03/26/2023 3:32 PM      Office [phone] Fax [phone]  Notice: This dictation was prepared with  Dragon dictation along with smaller phrase technology. Any transcriptional errors that result from this process are unintentional and may not be corrected upon review.

## 2023-03-26 ENCOUNTER — Ambulatory Visit: Payer: Medicare Other | Attending: Adult Health | Admitting: Adult Health

## 2023-03-26 ENCOUNTER — Encounter: Payer: Self-pay | Admitting: Adult Health

## 2023-03-26 VITALS — BP 132/62 | HR 66 | Ht 60.0 in | Wt 100.4 lb

## 2023-03-26 DIAGNOSIS — F418 Other specified anxiety disorders: Secondary | ICD-10-CM

## 2023-03-26 DIAGNOSIS — I10 Essential (primary) hypertension: Secondary | ICD-10-CM | POA: Diagnosis not present

## 2023-03-26 DIAGNOSIS — E7800 Pure hypercholesterolemia, unspecified: Secondary | ICD-10-CM | POA: Diagnosis not present

## 2023-03-26 MED ORDER — IRBESARTAN 75 MG PO TABS: 75.0000 mg | ORAL_TABLET | Freq: Every day | ORAL | 3 refills | Status: DC

## 2023-03-26 MED ORDER — METOPROLOL SUCCINATE ER 25 MG PO TB24: 25.0000 mg | ORAL_TABLET | Freq: Every day | ORAL | 3 refills | Status: DC

## 2023-03-26 MED ORDER — AMLODIPINE BESYLATE 2.5 MG PO TABS: 2.5000 mg | ORAL_TABLET | Freq: Every day | ORAL | 3 refills | Status: DC

## 2023-03-26 NOTE — Patient Instructions (Signed)
Medication Instructions:  No Changes *If you need a refill on your cardiac medications before your next appointment, please call your pharmacy*   Lab Work: No Labs If you have labs (blood work) drawn today and your tests are completely normal, you will receive your results only by: MyChart Message (if you have MyChart) OR A paper copy in the mail If you have any lab test that is abnormal or we need to change your treatment, we will call you to review the results.   Testing/Procedures: No Testing   Follow-Up: At [facility], you and your health needs are our priority.  As part of our continuing mission to provide you with exceptional heart care, we have created designated Provider Care Teams.  These Care Teams include your primary Cardiologist (physician) and Advanced Practice Providers (APPs -  Physician Assistants and Nurse Practitioners) who all work together to provide you with the care you need, when you need it.  We recommend signing up for the patient portal called "MyChart".  Sign up information is provided on this After Visit Summary.  MyChart is used to connect with patients for Virtual Visits (Telemedicine).  Patients are able to view lab/test results, encounter notes, upcoming appointments, etc.  Non-urgent messages can be sent to your provider as well.   To learn more about what you can do with MyChart, go to [email or web address].    Your next appointment:   6 month(s)  Provider:   Mihai Croitoru, MD   

## 2023-03-31 DIAGNOSIS — B355 Tinea imbricata: Secondary | ICD-10-CM | POA: Diagnosis not present

## 2023-03-31 DIAGNOSIS — L400 Psoriasis vulgaris: Secondary | ICD-10-CM | POA: Diagnosis not present

## 2023-05-24 DIAGNOSIS — L400 Psoriasis vulgaris: Secondary | ICD-10-CM | POA: Diagnosis not present

## 2023-06-07 DIAGNOSIS — H401132 Primary open-angle glaucoma, bilateral, moderate stage: Secondary | ICD-10-CM | POA: Diagnosis not present

## 2023-06-21 DIAGNOSIS — L400 Psoriasis vulgaris: Secondary | ICD-10-CM | POA: Diagnosis not present

## 2023-06-23 DIAGNOSIS — R7989 Other specified abnormal findings of blood chemistry: Secondary | ICD-10-CM | POA: Diagnosis not present

## 2023-06-23 DIAGNOSIS — Z79899 Other long term (current) drug therapy: Secondary | ICD-10-CM | POA: Diagnosis not present

## 2023-06-23 DIAGNOSIS — E782 Mixed hyperlipidemia: Secondary | ICD-10-CM | POA: Diagnosis not present

## 2023-06-23 DIAGNOSIS — E559 Vitamin D deficiency, unspecified: Secondary | ICD-10-CM | POA: Diagnosis not present

## 2023-06-23 DIAGNOSIS — I10 Essential (primary) hypertension: Secondary | ICD-10-CM | POA: Diagnosis not present

## 2023-06-23 DIAGNOSIS — K589 Irritable bowel syndrome without diarrhea: Secondary | ICD-10-CM | POA: Diagnosis not present

## 2023-06-30 DIAGNOSIS — K589 Irritable bowel syndrome without diarrhea: Secondary | ICD-10-CM | POA: Diagnosis not present

## 2023-06-30 DIAGNOSIS — E782 Mixed hyperlipidemia: Secondary | ICD-10-CM | POA: Diagnosis not present

## 2023-06-30 DIAGNOSIS — M5136 Other intervertebral disc degeneration, lumbar region: Secondary | ICD-10-CM | POA: Diagnosis not present

## 2023-06-30 DIAGNOSIS — H409 Unspecified glaucoma: Secondary | ICD-10-CM | POA: Diagnosis not present

## 2023-06-30 DIAGNOSIS — Z0000 Encounter for general adult medical examination without abnormal findings: Secondary | ICD-10-CM | POA: Diagnosis not present

## 2023-07-13 ENCOUNTER — Telehealth: Payer: Self-pay | Admitting: Cardiovascular Disease

## 2023-07-13 NOTE — Telephone Encounter (Signed)
Pt c/o BP issue: STAT if pt c/o blurred vision, one-sided weakness or slurred speech  1. What are your last 5 BP readings? 152/78 HR 78; 132/72 HR 65  2. Are you having any other symptoms (ex. Dizziness, headache, blurred vision, passed out)? Dizziness, nausea   3. What is your BP issue?   Calling to see if she needs to take her bp medicine

## 2023-07-13 NOTE — Telephone Encounter (Signed)
Those blood pressure and heart rate readings are near normal and should not cause any dizziness.  It is possible that she is having some transient side effects from the RSV vaccine.  If anything prednisone increases the blood pressure slightly and she should continue taking her blood pressure medications.

## 2023-07-13 NOTE — Telephone Encounter (Signed)
Returned pt call. Pt states she did not take her BP medication this morning due to her BP's being 152/78 HR 78 and 132/72 HR 65. She took the RSV vaccine and has had some issues like dizziness and her family doctor put her on prednisone. After taking the Norvasc she gets dizzy. She will take her pulse with her pulse ox and her HR runs in the 50's at times. She wants to know if her BP's are related to these other medications? She is trying to rule out other meds. She takes 3 different medications and she is very concerned. She take Timolol as well every morning. She really would like clarification and advice on taking all these medications. This has gone on since July after the vaccine.

## 2023-07-13 NOTE — Telephone Encounter (Signed)
Returned pt call. Advised pt per Dr. Royann Shivers recommendation.   Those blood pressure and heart rate readings are near normal and should not cause any dizziness. It is possible that she is having some transient side effects from the RSV vaccine. If anything prednisone increases the blood pressure slightly and she should continue taking her blood pressure medications.   Pt verbalized understanding.

## 2023-08-07 ENCOUNTER — Encounter (HOSPITAL_COMMUNITY): Payer: Self-pay | Admitting: Emergency Medicine

## 2023-08-07 ENCOUNTER — Emergency Department (HOSPITAL_COMMUNITY): Payer: Medicare Other

## 2023-08-07 ENCOUNTER — Emergency Department (HOSPITAL_COMMUNITY)
Admission: EM | Admit: 2023-08-07 | Discharge: 2023-08-07 | Disposition: A | Discharge status: Home / Self Care | Payer: Medicare Other | Attending: Emergency Medicine | Admitting: Emergency Medicine

## 2023-08-07 ENCOUNTER — Other Ambulatory Visit: Payer: Self-pay

## 2023-08-07 DIAGNOSIS — E119 Type 2 diabetes mellitus without complications: Secondary | ICD-10-CM | POA: Insufficient documentation

## 2023-08-07 DIAGNOSIS — R55 Syncope and collapse: Secondary | ICD-10-CM | POA: Diagnosis not present

## 2023-08-07 DIAGNOSIS — R42 Dizziness and giddiness: Secondary | ICD-10-CM | POA: Diagnosis not present

## 2023-08-07 DIAGNOSIS — I6622 Occlusion and stenosis of left posterior cerebral artery: Secondary | ICD-10-CM | POA: Diagnosis not present

## 2023-08-07 DIAGNOSIS — R918 Other nonspecific abnormal finding of lung field: Secondary | ICD-10-CM | POA: Diagnosis not present

## 2023-08-07 LAB — COMPREHENSIVE METABOLIC PANEL
ALT: 18 U/L (ref 0–44)
AST: 22 U/L (ref 15–41)
Albumin: 4.3 g/dL (ref 3.5–5.0)
Alkaline Phosphatase: 39 U/L (ref 38–126)
Anion gap: 10 (ref 5–15)
BUN: 16 mg/dL (ref 8–23)
CO2: 27 mmol/L (ref 22–32)
Calcium: 9.7 mg/dL (ref 8.9–10.3)
Chloride: 102 mmol/L (ref 98–111)
Creatinine, Ser: 0.57 mg/dL (ref 0.44–1.00)
GFR, Estimated: >60 mL/min (ref >60)
Glucose, Bld: 107 mg/dL — ABNORMAL HIGH (ref 70–99)
Potassium: 3.5 mmol/L (ref 3.5–5.1)
Sodium: 139 mmol/L (ref 135–145)
Total Bilirubin: 0.7 mg/dL (ref 0.3–1.2)
Total Protein: 7.5 g/dL (ref 6.5–8.1)

## 2023-08-07 LAB — URINALYSIS, ROUTINE W REFLEX MICROSCOPIC
Bacteria, UA: NONE SEEN
Bilirubin Urine: NEGATIVE
Glucose, UA: NEGATIVE mg/dL
Hgb urine dipstick: NEGATIVE
Ketones, ur: NEGATIVE mg/dL
Nitrite: NEGATIVE
Protein, ur: NEGATIVE mg/dL
Specific Gravity, Urine: 1.009 (ref 1.005–1.030)
pH: 6 (ref 5.0–8.0)

## 2023-08-07 LAB — CBC WITH DIFFERENTIAL/PLATELET
Abs Immature Granulocytes: 0.04 10*3/uL (ref 0.00–0.07)
Basophils Absolute: 0.1 10*3/uL (ref 0.0–0.1)
Basophils Relative: 1 %
Eosinophils Absolute: 0.1 10*3/uL (ref 0.0–0.5)
Eosinophils Relative: 1 %
HCT: 45.4 % (ref 36.0–46.0)
Hemoglobin: 14.9 g/dL (ref 12.0–15.0)
Immature Granulocytes: 0 %
Lymphocytes Relative: 30 %
Lymphs Abs: 2.7 10*3/uL (ref 0.7–4.0)
MCH: 32.4 pg (ref 26.0–34.0)
MCHC: 32.8 g/dL (ref 30.0–36.0)
MCV: 98.7 fL (ref 80.0–100.0)
Monocytes Absolute: 0.6 10*3/uL (ref 0.1–1.0)
Monocytes Relative: 7 %
Neutro Abs: 5.6 10*3/uL (ref 1.7–7.7)
Neutrophils Relative %: 61 %
Platelets: 270 10*3/uL (ref 150–400)
RBC: 4.6 MIL/uL (ref 3.87–5.11)
RDW: 13.6 % (ref 11.5–15.5)
WBC: 9.2 10*3/uL (ref 4.0–10.5)
nRBC: 0 % (ref 0.0–0.2)

## 2023-08-07 MED ORDER — LACTATED RINGERS IV BOLUS
500.0000 mL | Freq: Once | INTRAVENOUS | Status: AC
  Administered 2023-08-07: 500 mL via INTRAVENOUS

## 2023-08-07 MED ORDER — IOHEXOL 350 MG/ML SOLN
100.0000 mL | Freq: Once | INTRAVENOUS | Status: AC | PRN
  Administered 2023-08-07: 100 mL via INTRAVENOUS

## 2023-08-07 NOTE — Discharge Instructions (Signed)
Follow-up with Dr. Jenne Pane of the ENT or one of his colleagues in the next week or 2 for your congestion and dizziness.  Follow-up with [facility] in the next few weeks so they can evaluate your mildly swollen blood vessel

## 2023-08-07 NOTE — ED Provider Notes (Signed)
[place] EMERGENCY DEPARTMENT AT [facility] Provider Note   CSN: [id number] Arrival date & time: 08/07/23  1101     History  Chief Complaint  Patient presents with   Dizziness    Kara Campos is a 87 y.o. female.   Dizziness 87 year old female history of diabetes, hyperlipidemia presenting for dizziness.  Patient is here with her daughter.  Patient states 6 weeks ago she received the RSV vaccine.  Since then she has felt very dizzy.  Dizziness is described as feeling unbalanced.  It occurs when she sits up from laying down or stands up.  It is better after she rests.  It is not worse with head movement.  She does not have any palpitations, chest pain, shortness of breath.  No spinning sensation.  No pain in her ears or hearing changes.  No fevers or chills.  Is been slightly worse over the last couple days show she presents for evaluation.  Her PCP is tried her on meclizine, Dramamine, Valium which has not helped.  She is concerned it could be related to her amlodipine.  She feels like it slightly worse after she takes amlodipine in the morning.  This is not new medication.     Home Medications Prior to Admission medications   Medication Sig Start Date End Date Taking? Authorizing Provider  amLODipine (NORVASC) 2.5 MG tablet Take 1 tablet (2.5 mg total) by mouth daily. 03/26/23   Jodelle Gross, NP  co-enzyme Q-10 30 MG capsule Take 50 mg by mouth once a week.    [provider]  Cyanocobalamin (VITAMIN B-12 SL) Place 1 tablet under the tongue daily.    [provider]  fish oil-omega-3 fatty acids 1000 MG capsule Take 2 g by mouth as directed.     [provider]  irbesartan (AVAPRO) 75 MG tablet Take 1 tablet (75 mg total) by mouth at bedtime. 03/26/23   Jodelle Gross, NP  Lactobacillus (PROBIOTIC ACIDOPHILUS PO) Take by mouth.    [provider]  magnesium gluconate (MAGONATE) 500 MG tablet Take 500 mg by mouth daily.     [provider]  metoprolol succinate (TOPROL-XL) 25 MG 24 hr tablet Take 1 tablet (25 mg total) by mouth daily. 03/26/23   Jodelle Gross, NP  nortriptyline (PAMELOR) 10 MG capsule Take 10 mg by mouth at bedtime.    [provider]  timolol (BETIMOL) 0.5 % ophthalmic solution Place 2 drops into both eyes daily.     [provider]      Allergies    Codeine, Nitrofurantoin macrocrystal, and Sulfa antibiotics    Review of Systems   Review of Systems  Neurological:  Positive for dizziness.  Review of systems completed and notable as per HPI.  ROS otherwise negative.   Physical Exam Updated Vital Signs BP (!) 162/61 Comment: nurse notified  Pulse 62   Temp 98.1 F (36.7 C) (Oral)   Resp 18   SpO2 98%  Physical Exam Vitals and nursing note reviewed.  Constitutional:      General: She is not in acute distress.    Appearance: She is well-developed.  HENT:     Head: Normocephalic and atraumatic.     Nose: Nose normal.     Mouth/Throat:     Mouth: Mucous membranes are moist.     Pharynx: Oropharynx is clear.  Eyes:     Extraocular Movements: Extraocular movements intact.     Conjunctiva/sclera: Conjunctivae normal.  Pupils: Pupils are equal, round, and reactive to light.  Neck:     Vascular: No carotid bruit.  Cardiovascular:     Rate and Rhythm: Normal rate and regular rhythm.     Pulses: Normal pulses.     Heart sounds: Normal heart sounds. No murmur heard. Pulmonary:     Effort: Pulmonary effort is normal. No respiratory distress.     Breath sounds: Normal breath sounds.  Abdominal:     Palpations: Abdomen is soft.     Tenderness: There is no abdominal tenderness. There is no guarding or rebound.  Musculoskeletal:        General: No swelling.     Cervical back: Normal range of motion and neck supple. No rigidity or tenderness.     Right lower leg: No edema.     Left lower leg: No edema.  Skin:    General: Skin is warm and dry.      Capillary Refill: Capillary refill takes less than 2 seconds.  Neurological:     General: No focal deficit present.     Mental Status: She is alert and oriented to person, place, and time. Mental status is at baseline.     Cranial Nerves: No cranial nerve deficit.     Sensory: No sensory deficit.     Motor: No weakness.     Coordination: Coordination normal.     Gait: Gait normal.     Comments: Negative Dix-Hallpike bilaterally  Psychiatric:        Mood and Affect: Mood normal.     ED Results / Procedures / Treatments   Labs (all labs ordered are listed, but only abnormal results are displayed) Labs Reviewed  COMPREHENSIVE METABOLIC PANEL - Abnormal; Notable for the following components:      Result Value   Glucose, Bld 107 (*)    All other components within normal limits  URINALYSIS, ROUTINE W REFLEX MICROSCOPIC - Abnormal; Notable for the following components:   Color, Urine STRAW (*)    Leukocytes,Ua TRACE (*)    All other components within normal limits  CBC WITH DIFFERENTIAL/PLATELET    EKG EKG Interpretation Date/Time:  Saturday August 07 2023 12:23:07 EDT Ventricular Rate:  57 PR Interval:  159 QRS Duration:  88 QT Interval:  467 QTC Calculation: 455 R Axis:   -3  Text Interpretation: Sinus rhythm Borderline T abnormalities, anterior leads Confirmed by Fulton Reek [phone]) on 08/07/2023 12:55:39 PM  Radiology No results found.  Procedures Procedures    Medications Ordered in ED Medications  lactated ringers bolus 500 mL (0 mLs Intravenous Stopped 08/07/23 1339)  iohexol (OMNIPAQUE) 350 MG/ML injection 100 mL (100 mLs Intravenous Contrast Given 08/07/23 1318)    ED Course/ Medical Decision Making/ A&P                                 Medical Decision Making Amount and/or Complexity of Data Reviewed Labs: ordered. Radiology: ordered.  Risk Prescription drug management.   Medical Decision Making:   Kara Campos is a 87 y.o. female who  presented to the ED today with dizziness.  Vital signs reviewed notable for hypertension.  On exam patient is currently asymptomatic.  When I stand her up she does have recurrence of symptoms.  I suspect this is more likely presyncope, although patient has somewhat of a hard time describing the symptoms.  She has normal neurologic exam, however given persistence will  obtain CTA to evaluate for significant stenosis which could cause vertebrobasilar insufficiency as well as other central cause although seems less consistent with vertigo.  Her Dix-Hallpike is negative, no signs of peripheral vertigo, and she has not had any improvement with the medication she is already tried.  EKG does not show any signs of arrhythmia or ischemia.  No chest pain either, low concern for ACS.  Could be presyncope and orthostasis in setting of medications.  I reviewed her prior cardiology note which noted she has had some orthostatic symptoms before.   Additional history discussed with patient's family/caregivers.  Patient placed on continuous vitals and telemetry monitoring while in ED which was reviewed periodically.  Reviewed and confirmed nursing documentation for past medical history, family history, social history.  Reassessment and Plan:   After fluid she is feeling better.  No additional dizziness, urinating without difficulty.  Lab work here is reassuring.  Chest x-ray, CTA head and neck pending.  Handoff given to Dr. Estell Harpin at 3:30 PM with plan to follow-up chest x-ray and CTA.  Anticipate discharge home if these are stable.  Daughter is able to stay with patient for next couple days to help her, recommend close PCP follow-up.   Patient's presentation is most consistent with acute complicated illness / injury requiring diagnostic workup.           Final Clinical Impression(s) / ED Diagnoses Final diagnoses:  Dizziness    Rx / DC Orders ED Discharge Orders     None         Laurence Spates,  MD 08/07/23 [phone]

## 2023-08-07 NOTE — ED Triage Notes (Signed)
Pt reports dizziness and sinus pressure that started after she received her RSV Vaccine approximately 6 weeks ago. Pt reports she has seen her PCP and tried dramamine and meclizine with no relief.

## 2023-08-16 DIAGNOSIS — I671 Cerebral aneurysm, nonruptured: Secondary | ICD-10-CM | POA: Diagnosis not present

## 2023-08-31 DIAGNOSIS — R42 Dizziness and giddiness: Secondary | ICD-10-CM | POA: Diagnosis not present

## 2023-09-02 ENCOUNTER — Telehealth: Payer: Self-pay

## 2023-09-02 NOTE — Telephone Encounter (Signed)
Transition Care Management Unsuccessful Follow-up Telephone Call  Date of discharge and from where:  08/07/2023 [facility]  Attempts:  1st Attempt  Reason for unsuccessful TCM follow-up call:  Left voice message  Jaydah Stahle [facility]  [facility], [facility] Resource Care Guide Direct Dial: [phone]  Website: [email or web address]

## 2023-09-03 ENCOUNTER — Telehealth: Payer: Self-pay

## 2023-09-03 NOTE — Telephone Encounter (Signed)
Transition Care Management Unsuccessful Follow-up Telephone Call  Date of discharge and from where:  08/07/2023 [facility]  Attempts:  2nd Attempt  Reason for unsuccessful TCM follow-up call:  Left voice message  Sharan Mcenaney [facility]  [facility], [facility] Resource Care Guide Direct Dial: [phone]  Website: [email or web address]

## 2023-09-08 ENCOUNTER — Emergency Department (HOSPITAL_COMMUNITY)
Admission: EM | Admit: 2023-09-08 | Discharge: 2023-09-08 | Disposition: A | Discharge status: Home / Self Care | Payer: Medicare Other | Attending: Emergency Medicine | Admitting: Emergency Medicine

## 2023-09-08 ENCOUNTER — Encounter (HOSPITAL_COMMUNITY): Payer: Self-pay

## 2023-09-08 ENCOUNTER — Other Ambulatory Visit: Payer: Self-pay

## 2023-09-08 DIAGNOSIS — E119 Type 2 diabetes mellitus without complications: Secondary | ICD-10-CM | POA: Insufficient documentation

## 2023-09-08 DIAGNOSIS — I10 Essential (primary) hypertension: Secondary | ICD-10-CM | POA: Insufficient documentation

## 2023-09-08 DIAGNOSIS — Z79899 Other long term (current) drug therapy: Secondary | ICD-10-CM | POA: Diagnosis not present

## 2023-09-08 DIAGNOSIS — R42 Dizziness and giddiness: Secondary | ICD-10-CM | POA: Diagnosis present

## 2023-09-08 LAB — CBC
HCT: 43.7 % (ref 36.0–46.0)
Hemoglobin: 14.6 g/dL (ref 12.0–15.0)
MCH: 32.8 pg (ref 26.0–34.0)
MCHC: 33.4 g/dL (ref 30.0–36.0)
MCV: 98.2 fL (ref 80.0–100.0)
Platelets: 254 10*3/uL (ref 150–400)
RBC: 4.45 MIL/uL (ref 3.87–5.11)
RDW: 13.1 % (ref 11.5–15.5)
WBC: 9.2 10*3/uL (ref 4.0–10.5)
nRBC: 0 % (ref 0.0–0.2)

## 2023-09-08 LAB — MAGNESIUM: Magnesium: 2.1 mg/dL (ref 1.7–2.4)

## 2023-09-08 LAB — COMPREHENSIVE METABOLIC PANEL
ALT: 18 U/L (ref 0–44)
AST: 22 U/L (ref 15–41)
Albumin: 4.3 g/dL (ref 3.5–5.0)
Alkaline Phosphatase: 37 U/L — ABNORMAL LOW (ref 38–126)
Anion gap: 11 (ref 5–15)
BUN: 19 mg/dL (ref 8–23)
CO2: 21 mmol/L — ABNORMAL LOW (ref 22–32)
Calcium: 9.4 mg/dL (ref 8.9–10.3)
Chloride: 105 mmol/L (ref 98–111)
Creatinine, Ser: 0.51 mg/dL (ref 0.44–1.00)
GFR, Estimated: >60 mL/min (ref >60)
Glucose, Bld: 126 mg/dL — ABNORMAL HIGH (ref 70–99)
Potassium: 3.6 mmol/L (ref 3.5–5.1)
Sodium: 137 mmol/L (ref 135–145)
Total Bilirubin: 1 mg/dL (ref 0.3–1.2)
Total Protein: 7.6 g/dL (ref 6.5–8.1)

## 2023-09-08 LAB — CBG MONITORING, ED: Glucose-Capillary: 108 mg/dL — ABNORMAL HIGH (ref 70–99)

## 2023-09-08 LAB — TROPONIN I (HIGH SENSITIVITY): Troponin I (High Sensitivity): 18 ng/L — ABNORMAL HIGH (ref <18)

## 2023-09-08 NOTE — ED Triage Notes (Addendum)
Pt complaining of hypertension, pressure of 214/90. Pt has been taking her blood pressure medication, but reports that it only temporally controls pressure. Pt endorses dizziness, and nausea. Pt took an extra amlodipine, and metoprolol today. Pt also has hx of aneurysm.

## 2023-09-08 NOTE — ED Provider Triage Note (Signed)
Emergency Medicine Provider Triage Evaluation Note  VIVIANNE CARLES , a 87 y.o. female  was evaluated in triage.  Pt complains of elevated blood pressure is earlier today in the 190s to 200s over 90s.  States that usually is not this high and so she took an extra amlodipine 2.5 mg and an extra metoprolol 25 mg.  She also reports a headache yesterday and today.  Also reports dizziness that has been ongoing for couple weeks.  Denies any shortness of breath, chest pain.   Review of Systems  Positive: As above Negative: As above  Physical Exam  BP (!) 174/72 (BP Location: Left Arm)   Pulse 65   Temp 98.3 F (36.8 C) (Oral)   Resp 18   Ht 5' (1.524 m)   Wt 45.4 kg   SpO2 96%   BMI 19.53 kg/m  Gen:   Awake, no distress   Resp:  Normal effort  MSK:   Moves extremities without difficulty   Regular rate and rhythm on cardiac auscultation, lungs clear to auscultation bilaterally   Medical Decision Making  Medically screening exam initiated at 4:15 PM.  Appropriate orders placed.  KHAMRYN CALDERONE was informed that the remainder of the evaluation will be completed by another provider, this initial triage assessment does not replace that evaluation, and the importance of remaining in the ED until their evaluation is complete.  Blood pressure currently at 174/72, patient well-appearing.   Arabella Merles, PA-C 09/08/23 1617

## 2023-09-08 NOTE — ED Provider Triage Note (Signed)
Emergency Medicine Provider Triage Evaluation Note  Kara Campos , a 87 y.o. female  was evaluated in triage.  Pt complains of elevated blood pressure readings today.  Reports readings in the systolic of 190s to low 200s. Took 5mg  amlodipine and and extra metoprolol prior to arrival.  Reports some feelings of dizziness.  Denies any chest pain, shortness of breath, nausea or vomiting.  Review of Systems  Positive: As above Negative: As above  Physical Exam  BP (!) 174/72 (BP Location: Left Arm)   Pulse 65   Temp 98.3 F (36.8 C) (Oral)   Resp 18   Ht 5' (1.524 m)   Wt 45.4 kg   SpO2 96%   BMI 19.53 kg/m  Gen:   Awake, no distress   Resp:  Normal effort  MSK:   Moves extremities without difficulty    Medical Decision Making  Medically screening exam initiated at 4:12 PM.  Appropriate orders placed.  Kara Campos was informed that the remainder of the evaluation will be completed by another provider, this initial triage assessment does not replace that evaluation, and the importance of remaining in the ED until their evaluation is complete.     Arabella Merles, PA-C 09/09/23 0015

## 2023-09-08 NOTE — Discharge Instructions (Addendum)
Your symptoms are consistent with uncontrolled hypertension but improved in the emergency department after you had at home taken your extra doses of home blood pressure medicine.  Follow-up with your primary care provider and cardiologist for continued medication management.  Your cardiac troponin was only mildly elevated at 18 however with your improvement in symptoms and lack of chest  discomfort, you are safe to follow-up with your PCP.

## 2023-09-08 NOTE — ED Provider Notes (Signed)
[place] EMERGENCY DEPARTMENT AT [facility] Provider Note   CSN: [id number] Arrival date & time: 09/08/23  1532     History  Chief Complaint  Patient presents with   Hypertension    Kara Campos is a 87 y.o. female.   Hypertension     87 year old female with medical history significant for DM, HLD, HTN, presenting to the emergency department with a chief complaint of uncontrolled blood pressure.  She endorses lightheadedness with associated nausea.  She underwent CT angiogram of the head and neck on 08/07/2023 which demonstrated a 3 x 2 mm posteriorly projecting outpouching of the supraclinoid ICA on the right suspicious for saccular aneurysm, followed up outpatient with ENT for longstanding symptoms of lightheaded, lower suspicion for vestibular source per ENT notes.  Today, she is primarily concerned with uncontrolled blood pressure which has not been managed with her home medications.  She denies any chest pain or shortness of breath.  Endorses mild lightheadedness and mild nausea which has improved somewhat since waiting in the emergency department.  She took an extra dose of her amlodipine and metoprolol today and her blood pressure has since come down appropriately.  She denies any severe headache, neck stiffness, fevers or chills, no cough, chest pain, shortness of breath.  No vomiting.  No syncope. She currently has no vertiginous symptoms.  Home Medications Prior to Admission medications   Medication Sig Start Date End Date Taking? Authorizing Provider  ADVIL 200 MG CAPS Take 200-400 mg by mouth every 6 (six) hours as needed (for headaches).   Yes [provider]  amLODipine (NORVASC) 2.5 MG tablet Take 1 tablet (2.5 mg total) by mouth daily. 03/26/23  Yes Jodelle Gross, NP  Cholecalciferol (VITAMIN D3) 50 MCG (2000 UT) TABS Take 2,000 Units by mouth every other day.   Yes [provider]  CLARITIN 10 MG tablet Take 10 mg by mouth daily as  needed for allergies or rhinitis.   Yes [provider]  Cyanocobalamin (VITAMIN B-12 SL) Place 1 tablet under the tongue daily.   Yes [provider]  fish oil-omega-3 fatty acids 1000 MG capsule Take 1 g by mouth every other day.   Yes [provider]  fluticasone (FLONASE) 50 MCG/ACT nasal spray Place 1 spray into both nostrils 2 (two) times daily as needed for allergies or rhinitis.   Yes [provider]  irbesartan (AVAPRO) 75 MG tablet Take 1 tablet (75 mg total) by mouth at bedtime. 03/26/23  Yes Jodelle Gross, NP  ketoconazole (NIZORAL) 2 % cream Apply 1 Application topically daily as needed for irritation.   Yes [provider]  Lactobacillus (PROBIOTIC ACIDOPHILUS PO) Take 1 capsule by mouth at bedtime.   Yes [provider]  magnesium gluconate (MAGONATE) 500 MG tablet Take 500 mg by mouth every other day.   Yes [provider]  metoprolol succinate (TOPROL-XL) 25 MG 24 hr tablet Take 1 tablet (25 mg total) by mouth daily. 03/26/23  Yes Jodelle Gross, NP  NON FORMULARY Take 1 tablet by mouth See admin instructions. [employer] Tablets- Take 1 tablet by mouth at bedtime as needed for sleep   Yes [provider]  sodium chloride (OCEAN) 0.65 % SOLN nasal spray Place 1 spray into both nostrils as needed for congestion.   Yes [provider]  timolol (BETIMOL) 0.5 % ophthalmic solution Place 1 drop into both eyes 2 (two) times daily.   Yes [provider]  Allergies    Codeine, Nitrofurantoin macrocrystal, Sulfa antibiotics, and Tape    Review of Systems   Review of Systems  Neurological:  Positive for light-headedness.  All other systems reviewed and are negative.   Physical Exam Updated Vital Signs BP (!) 168/75   Pulse 61   Temp 98.3 F (36.8 C) (Oral)   Resp 12   Ht 5' (1.524 m)   Wt 45.4 kg   SpO2 97%   BMI 19.53 kg/m  Physical Exam Vitals and nursing note  reviewed.  Constitutional:      General: She is not in acute distress.    Appearance: She is well-developed.  HENT:     Head: Normocephalic and atraumatic.  Eyes:     Conjunctiva/sclera: Conjunctivae normal.  Cardiovascular:     Rate and Rhythm: Normal rate and regular rhythm.  Pulmonary:     Effort: Pulmonary effort is normal. No respiratory distress.     Breath sounds: Normal breath sounds.  Abdominal:     Palpations: Abdomen is soft.     Tenderness: There is no abdominal tenderness.  Musculoskeletal:        General: No swelling.     Cervical back: Neck supple.  Skin:    General: Skin is warm and dry.     Capillary Refill: Capillary refill takes less than 2 seconds.  Neurological:     Mental Status: She is alert.     Comments: MENTAL STATUS EXAM:    Orientation: Alert and oriented to person, place and time.  Memory: Cooperative, follows commands well.  Language: Speech is clear and language is normal.   CRANIAL NERVES:    CN 2 (Optic): Visual fields intact to confrontation.  CN 3,4,6 (EOM): Pupils equal and reactive to light. Full extraocular eye movement without nystagmus.  CN 5 (Trigeminal): Facial sensation is normal, no weakness of masticatory muscles.  CN 7 (Facial): No facial weakness or asymmetry.  CN 8 (Auditory): Auditory acuity grossly normal.  CN 9,10 (Glossophar): The uvula is midline, the palate elevates symmetrically.  CN 11 (spinal access): Normal sternocleidomastoid and trapezius strength.  CN 12 (Hypoglossal): The tongue is midline. No atrophy or fasciculations.[name]   MOTOR:  Muscle Strength: 5/5RUE, 5/5LUE, 5/5RLE, 5/5LLE.   COORDINATION:   No tremor.   SENSATION:   Intact to light touch all four extremities.   Psychiatric:        Mood and Affect: Mood normal.     ED Results / Procedures / Treatments   Labs (all labs ordered are listed, but only abnormal results are displayed) Labs Reviewed  COMPREHENSIVE METABOLIC PANEL - Abnormal; Notable for  the following components:      Result Value   CO2 21 (*)    Glucose, Bld 126 (*)    Alkaline Phosphatase 37 (*)    All other components within normal limits  CBG MONITORING, ED - Abnormal; Notable for the following components:   Glucose-Capillary 108 (*)    All other components within normal limits  TROPONIN I (HIGH SENSITIVITY) - Abnormal; Notable for the following components:   Troponin I (High Sensitivity) 18 (*)    All other components within normal limits  CBC  MAGNESIUM    EKG EKG Interpretation Date/Time:  Wednesday September 08 2023 17:22:52 EDT Ventricular Rate:  59 PR Interval:  154 QRS Duration:  78 QT Interval:  437 QTC Calculation: 433 R Axis:   -4  Text Interpretation: Sinus bradycardia Probable left atrial enlargement Confirmed by Ernie Avena ([id number])  on 09/08/2023 6:00:07 PM  Radiology No results found.  Procedures Procedures    Medications Ordered in ED Medications - No data to display  ED Course/ Medical Decision Making/ A&P                                 Medical Decision Making    87 year old female with medical history significant for DM, HLD, HTN, presenting to the emergency department with a chief complaint of uncontrolled blood pressure.  She endorses lightheadedness with associated nausea.  She underwent CT angiogram of the head and neck on 08/07/2023 which demonstrated a 3 x 2 mm posteriorly projecting outpouching of the supraclinoid ICA on the right suspicious for saccular aneurysm, followed up outpatient with ENT for longstanding symptoms of lightheaded, lower suspicion for vestibular source per ENT notes.  Today, she is primarily concerned with uncontrolled blood pressure which has not been managed with her home medications.  She denies any chest pain or shortness of breath.  Endorses mild lightheadedness and mild nausea which has improved somewhat since waiting in the emergency department.  She took an extra dose of her amlodipine and metoprolol  today and her blood pressure has since come down appropriately.  She denies any severe headache, neck stiffness, fevers or chills, no cough, chest pain, shortness of breath.  No vomiting.  No syncope. She currently has no vertiginous symptoms.  On arrival, the patient was afebrile, vitally stable, initial blood pressure 174/72, continued to downtrend to 168/75 without intervention, saturating 96% on room air.  Patient presenting with relatively asymptomatic hypertension, initially mild lightheadedness and nausea has since improved and the patient is currently asymptomatic on my evaluation with a reassuring neurologic exam.  Blood pressure appears to be downtrending appropriately after an extra dose of her home medications.  Screening EKG revealed sinus bradycardia, ventricular rate 59, no acute ischemic changes, CBG 108, CMP unremarkable with mild acidosis of 21, normal anion gap of 11, magnesium normal, CBC without a leukocytosis or anemia, initial cardiac troponin was 18.  I discussed with the patient her cardiac troponin, normal threshold being less than 18, she is currently chest pain-free and asymptomatic.  Her blood pressure is appropriately downtrending.  Discussed definition of hypertensive emergency and at this time with improving blood pressures, lack of symptoms, considered admission for observation versus discharge, the patient would strongly prefer discharge at this time with a plan for outpatient follow-up for further titration of her medications.  Low concern for ACS, PE, other intrathoracic abnormality.  EKG is without ischemic changes.  Patient without chest pain symptoms today, troponin of 18 in the setting of disease no symptoms of chest pain and likely very mild demand in the setting of the patient's hypertension which appears to be improving. No vision changes, neurologically intact, asymptomatic at this time.  No evidence of AKI.  Patient at this time is stable for outpatient management,  plan for outpatient follow-up with her cardiologist.   Final Clinical Impression(s) / ED Diagnoses Final diagnoses:  Hypertension, unspecified type    Rx / DC Orders ED Discharge Orders     None         Ernie Avena, MD 09/13/23 [phone]

## 2023-09-09 ENCOUNTER — Telehealth: Payer: Self-pay | Admitting: Cardiovascular Disease

## 2023-09-09 NOTE — Telephone Encounter (Signed)
Pt returning nurse's call. Please advise

## 2023-09-09 NOTE — Telephone Encounter (Signed)
Called and LM to call office

## 2023-09-09 NOTE — Telephone Encounter (Signed)
Pt called to reports that she was in the ER yesterday. She has a follow up appt 10/17. Pt has concerns about her Troponin level which was 18 in the ER. Per the patient the ER physician fells her problem may be coming from her cardiac medication.

## 2023-09-09 NOTE — Progress Notes (Unsigned)
Cardiology Office Note:    Date:  09/16/2023   ID:  Kara Campos, DOB 1936/11/22, MRN [id number]  PCP:  Irena Reichmann, DO  Cardiologist:  Thurmon Fair, MD     Referring MD: Irena Reichmann, DO   Chief Complaint: ED follow-up of hypertension  History of Present Illness:    Kara Campos is a 87 y.o. female with a history of hypertension, hyperlipidemia, and type 2 diabetes who is followed by Dr. Royann Shivers and presents today for ED follow-up of hypertension.   Patient is followed by Dr. Royann Shivers for hypertension. She has a long history of hypertension that has been difficult to control due to numerous side effects and some orthostatic symptoms in the past. She has had frequent adjustments in her medical regiment. She has only been able to tolerate low doses of beta-blockers in the past due to fatigue and has only been able to tolerate 2.5mg  daily of Amlodipine given edema and nausea with higher doses. She also has had a lot of stress/ anxiety over the last couple of years has been felt to be contributing to her higher BP.   She was last seen by Joni Reining, NP, in 4/204 at which time she continued to complain of elevated BP at home but related it to home stress which was keeping her very anxious. However, she was asymptomatic from a cardiac standpoint. Her BP was initially 140/90 in the office but then improved to 132/62 on recheck. Therefore, no medication changes were made. Psychological counseling and/ or grief counseling was also recommended but patient stated she did not have time to devote to counseling at that time. Current medication regimen includes Amlodipine 2.5mg  daily, Irbesartan 75mg  daily, and Toprol-XL 25mg  daily.   She was seen in the ED on 08/07/2023 for dizziness for the past 6 weeks after getting the RSV vaccine. It was worse with position changes. Dix-Hallpike maneuver was negative. Head/ neck CTA moderate narrowing of the P1 segment of the left PCA and a 3x2 mm  posteriorly projecting outpouching of the supraclinoid ICA on the right suspicious for a saccular aneurysm but no acute intracranial abnormality, intracranial large vessel occlusion, or hemodynamically significant stenosis in the neck. I cannot tell if orthostatics were actually checked during that visit but she was treated with IV fluids with improvement and discharged.   She was seen back in the ED on 09/08/2023 for elevated BP as high as 214/90 with an associated headache and dizziness. EKG showed no acute ischemic changes and high-sensitivity troponin was 18. EKG showed no acute ischemic changed. She denied any chest pain or anginal symptoms. Borderline troponin felt to be due to demand ischemia in setting of markedly elevated BP. Her BP improved and she was asymptomatic so was felt to be stable for discharge.  Patient presents today for follow-up. She is here with her sister.  She reports continued dizziness which she has had since she got an RSV vaccine in July.  The dizziness is not constant.  It is intermittent and seems to be be related to when her BP is high especially early in the day.  However, she also thinks that Her amlodipine causes her dizziness to be worse.  She is only on 2.5 mg daily of Amlodipine and has been unable to tolerate higher doses due to edema.  She states her BP is usually very high in the morning with systolic BP often in the 170s (but occasionally as high as the 200s).  Her BP responds  well to this and normally comes down to the 140s/70s during but then will continue to go up and down (suspect this is due to anxiety). She then usually drinks some wine in the afternoon and her systolic BP is usually in the 161W in the evening before she even takes her evening BP medications which are Irbesartan 75mg  and Toprol-XL 25mg  daily.  She has struggled with significant anxiety/ stress since her husband died a couple of years ago and admits that she is basically anxious all the time. She  checks her BP multiple times throughout the day. She reports some atypical chest pain that only lasts a couple of seconds and goes a way when she drinks a cold water or takes a deep breath. She states this is related to when she is very anxious. She also reports some shortness of breath and palpitations when she is anxious. Her PCP prescribed her Valium but she is worried about taking this. Per chart review, it looks like anxiety has been a problem for several years.   She seems stable from a cardiac standpoint. She is not having any chest pain that sounds cardiac in nature (atypical chest pain described above sounds secondary to anxiety). No significant shortness of breath. No orthopnea, PND, edema, or syncope. She reports some dizziness in the office today. BP is 140/60 and HR is 65. EKG shows normal sinus rhythm with no acute changes.  In regards to her dizziness, she has been evaluated by ENT at [facility] and work-up was unremarkable. Orthostatics were negative. Vestibular testing was offered (although there was not a strong suspicion of this) but patient declined. No vestibular suppressants were recommended. 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 I cannot see the full notes from this with the plan but patient states plan is just for routine monitoring but this is not felt to be causing any issues. She states she was told that she "will likely die with the saccular aneurysm but NOT from it."  EKGs/Labs/Other Studies Reviewed:    The following studies were reviewed:  N/A  EKG:  EKG not ordered today.  Recent Labs: 09/08/2023: ALT 18; BUN 19; Creatinine, Ser 0.51; Hemoglobin 14.6; Magnesium 2.1; Platelets 254; Potassium 3.6; Sodium 137  Recent Lipid Panel No results found for: "CHOL", "TRIG", "HDL", "CHOLHDL", "VLDL", "LDLCALC", "LDLDIRECT"  Physical Exam:    Vital Signs: BP (!) 140/60 (BP Location: Left Arm,  Patient Position: Sitting, Cuff Size: Normal)   Pulse 65   Ht 5' (1.524 m)   Wt 102 lb (46.3 kg)   SpO2 97%   BMI 19.92 kg/m     Wt Readings from Last 3 Encounters:  09/16/23 102 lb (46.3 kg)  09/08/23 100 lb (45.4 kg)  03/26/23 100 lb 6.4 oz (45.5 kg)     General: 87 y.o. Caucasian female in no acute distress. HEENT: Normocephalic and atraumatic. Sclera clear.  Neck: Supple. No JVD. Heart: RRR. Distinct S1 and S2. No murmurs, gallops, or rubs.  Lungs: No increased work of breathing. Clear to ausculation bilaterally. No wheezes, rhonchi, or rales.  Abdomen: Soft, non-distended, and non-tender to palpation.  Extremities: No lower extremity edema.  Skin: Warm and dry. Neuro: No focal deficits. Psych: Normal affect. Responds appropriately.  Assessment:    1. Essential hypertension   2. Dizziness   3. Atypical chest pain   4. Hyperlipidemia, unspecified hyperlipidemia type   5. Type 2 diabetes mellitus  without obesity (HCC)   6. Tobacco abuse   7. Anxiety     Plan:    Hypertension Patient has a long history of hypertension that has been difficult to control due to numerous side effects and some orthostatic symptoms in the past. Anxiety also felt to be playing a role in the past. She was recently seen in the ED last week with BP as high as 214/90. High-sensitivity troponin borderline at 18. EKG showed no acute ischemic changes. Troponin elevation was felt to be due to demand ischemia in setting of marked hypertensin. BP trended down on its own and she was discharged. - BP 140/60 in the office today which is reasonable given her advanced age. However, she reports high BP reading first thing in the morning and then intermittently throughout the day with systolic BP in the 160s to 170s (occasionally as high as the 200s). Suspect this is largely due to anxiety.  - Current medications: Amlodipine 2.5mg  daily, Irbesartan 75mg  daily, and Toprol-XL 25mg  daily.  - Will stop Amlodipine.  She thinks this causes her dizziness to be worse. I don't think that is the cause but I also don't think 2.5mg  is probably helping her BP that much so we can see how she does off this.  - Will stop Toprol-XL and start Coreg 3.125mg  twice daily. She has previously been unable to tolerate higher doses of beta-blockers due to fatigue but it does not look like she has ever tried Coreg and she is willing to try this.  - Asked patient to keep a BP/ HR log for 2 weeks. Depending on what BP readings show, may need to increase Irbesartan. Of note, she is currently checking her BP multiple times a day. Advised patient to try to only check her BP twice a day (once first thing in the morning and then again 2-3 hours after she takes her medications). Advised patient to only check her BP more than that if she is having significant symptoms but encouraged her not to check her BP if she is feeling significantly anxious as her BP is likely to be elevated at that time.   Dizziness Patient reports intermittent dizziness since getting an RSV vaccine in 05/2023. It seems to be related to when her BP is high but she also thinks it is worse after she takes Amlodipine. She has been seen by ENT and it is not felt to vestibular in nature. Vestibular testing was offered but patient declined.  - She reports some dizziness in the office. Vitals stable. EKG showed normal sinus rhythm with no acute ischemic changes.  - I do not think her dizziness is cardiac in nature. Suspect it is due to BP spikes and anxiety I did offer a 1 week Zio monitor but patient declined at this time. She states she will think about it and see how she does with the above medication changes.   Atypical Chest Pain Patient describes very atypical chest pain as described in HPI.  - EKG today shows no acute ischemic changes.  - This does not sound cardiac in nature. Strongly suspect this is due to anxiety. She did have a borderline elevated troponin during recent  ED visit on 09/08/2023. However, this was in the setting of a markedly elevated BP as high as 214/90. This was felt to be due to demand ischemia and I agree with this. Patient did ask about rechecking a troponin today. I offered to do this but explained that if it comes back  elevated, she will need to go back to the ED. She states she does not want to go back to the ED and declined. I do not think this was due to ACS so I feel comfortably wit this decision.   Hyperlipidemia Last lipid panel in 05/2023 (per [facility]): Total Cholesterol 239, Triglycerides 158, HDL 83, LDL 129. - She has declined to try statins in the past due to side effects with multiple medications.  - Did not discuss today.  Type 2 Diabetes Mellitus  Hemoglobin A1c 6.1%. - Management per PCP.  Tobacco Abuse Patient continues to smoke but is interested in quitting.  - Will provide Nicotine patches at patient's request.   Anxiety Patient has severe anxiety which I think is the root of most of her symptoms as well as her hypertension. Her PCP recently prescribed Valium but she has concerns about this. I do think she would greatly benefit from medications for her anxiety but I will leave treatment of this to her PCP. I did offer referral to Psychiatry (which some of my colleagues have offered in the past) but she also declined this for now. She would like to think about it some more.    **I spent at least 55 minutes seeing patient today, reviewing recent ED visits and charts in Care Everywhere, counseling patient on her hypertension and anxiety, answering questions about medications concerns, and documenting in the chart.**   Disposition: Follow up in 2 weeks for follow-up of BP.   Signed, Corrin Parker, PA-C  09/16/2023 7:27 PM    [facility]

## 2023-09-09 NOTE — Telephone Encounter (Signed)
New Message:      Patient says she would like for [facility] nurse to please give her a call. She said she had to go to [facility] ER yesterday . She says she have some questions and concerns she wants to discuss with the nurse. She made an appointment for next Thursday to see Callie.

## 2023-09-16 ENCOUNTER — Encounter: Payer: Self-pay | Admitting: Student

## 2023-09-16 ENCOUNTER — Ambulatory Visit: Payer: Medicare Other | Attending: Student | Admitting: Student

## 2023-09-16 VITALS — BP 140/60 | HR 65 | Ht 60.0 in | Wt 102.0 lb

## 2023-09-16 DIAGNOSIS — Z720 Tobacco use: Secondary | ICD-10-CM | POA: Diagnosis not present

## 2023-09-16 DIAGNOSIS — F419 Anxiety disorder, unspecified: Secondary | ICD-10-CM

## 2023-09-16 DIAGNOSIS — I10 Essential (primary) hypertension: Secondary | ICD-10-CM | POA: Diagnosis not present

## 2023-09-16 DIAGNOSIS — E119 Type 2 diabetes mellitus without complications: Secondary | ICD-10-CM | POA: Diagnosis not present

## 2023-09-16 DIAGNOSIS — R0789 Other chest pain: Secondary | ICD-10-CM

## 2023-09-16 DIAGNOSIS — E785 Hyperlipidemia, unspecified: Secondary | ICD-10-CM

## 2023-09-16 DIAGNOSIS — R42 Dizziness and giddiness: Secondary | ICD-10-CM

## 2023-09-16 MED ORDER — CARVEDILOL 3.125 MG PO TABS: 3.1250 mg | ORAL_TABLET | Freq: Two times a day (BID) | ORAL | 3 refills | Status: DC

## 2023-09-16 MED ORDER — NICOTINE 7 MG/24HR TD PT24: 7.0000 mg | MEDICATED_PATCH | Freq: Every day | TRANSDERMAL | 0 refills | Status: DC

## 2023-09-16 NOTE — Patient Instructions (Signed)
Medication Instructions:  STOP THE AMLODIPINE AND METOPROLOL  START CARVEDILOL 3.125MG . TAKE THIS MEDICATION TWO TIMES A DAY  CHECK YOUR BLOOD PRESSURE IN THE MORNING AND TWO HOURS AFTER YOU TAKE YOUR BLOOD PRESSURE MEDICATION ONLY.   *If you need a refill on your cardiac medications before your next appointment, please call your pharmacy*   Lab Work: NONE If you have labs (blood work) drawn today and your tests are completely normal, you will receive your results only by: MyChart Message (if you have MyChart) OR A paper copy in the mail If you have any lab test that is abnormal or we need to change your treatment, we will call you to review the results.   Testing/Procedures: NONE   Follow-Up: At [facility], you and your health needs are our priority.  As part of our continuing mission to provide you with exceptional heart care, we have created designated Provider Care Teams.  These Care Teams include your primary Cardiologist (physician) and Advanced Practice Providers (APPs -  Physician Assistants and Nurse Practitioners) who all work together to provide you with the care you need, when you need it.  We recommend signing up for the patient portal called "MyChart".  Sign up information is provided on this After Visit Summary.  MyChart is used to connect with patients for Virtual Visits (Telemedicine).  Patients are able to view lab/test results, encounter notes, upcoming appointments, etc.  Non-urgent messages can be sent to your provider as well.   To learn more about what you can do with MyChart, go to [email or web address].    Your next appointment:   2 week(s)  Provider:   Marjie Skiff, PA-C

## 2023-09-17 ENCOUNTER — Telehealth: Payer: Self-pay | Admitting: Cardiovascular Disease

## 2023-09-17 NOTE — Telephone Encounter (Signed)
Spoke to patient she stated she saw PA yesterday and her medications were changed.She stopped Metoprolol and Amlodipine.Carvedilol 3.25 mg twice a day was started.Stated B/P this morning 161/78 pulse 66.She has not taken Carvedilol this morning.Advised to take Carvedilol as prescribed.She did not take Valium 5 mg 1/2 tablet last night.She seemed anxious.Patient reassured.She will take all medications as prescribed.She will monitor B/P and keep a log.Advised to keep appointment with Marjie Skiff PA 11/11 at 2:20 pm.Advised to bring B/P readings to appointment.Advised to call sooner if needed.

## 2023-09-17 NOTE — Telephone Encounter (Signed)
Pt c/o medication issue:  1. Name of Medication:  carvedilol (COREG) 3.125 MG tablet   2. How are you currently taking this medication (dosage and times per day)?   3. Are you having a reaction (difficulty breathing--STAT)?   4. What is your medication issue?    Patient states Kara Campos prescribed Carvedilol yesterday and she took it last night as advised and this morning her BP is still elevated at 161/78. She states she has "unbelievable" dizziness and she would like to discuss Valium and other options.

## 2023-09-30 NOTE — Progress Notes (Unsigned)
Cardiology Office Note:    Date:  10/11/2023   ID:  Kara Campos, DOB 08-04-1936, MRN [id number]  PCP:  Kara Reichmann, DO  Cardiologist:  Kara Fair, MD     Referring MD: Kara Reichmann, DO   Chief Complaint: follow-up of hypertension   History of Present Illness:    Kara Campos is a 87 y.o. female with a history of hypertension, hyperlipidemia, and type 2 diabetes who is followed by Dr. Royann Campos and presents today for follow-up of hypertension.   Patient is followed by Dr. Royann Campos for hypertension. She has a long history of hypertension that has been difficult to control due to numerous side effects and some orthostatic symptoms in the past. Anxiety has also been felt to be a large contributor. She has had frequent adjustments in her medical regiment. She has only been able to tolerate low doses of beta-blockers in the past due to fatigue and has only been able to tolerate 2.5mg  daily of Amlodipine given edema and nausea with higher doses. She also has had a lot of stress/ anxiety over the last couple of years has been felt to be contributing to her higher BP.   She was seen in the ED on 08/07/2023 for dizziness for the past 6 weeks after getting the RSV vaccine. It was worse with position changes. Dix-Hallpike maneuver was negative. Head/ neck CTA moderate narrowing of the P1 segment of the left PCA and a 3x2 mm posteriorly projecting outpouching of the supraclinoid ICA on the right suspicious for a saccular aneurysm but no acute intracranial abnormality, intracranial large vessel occlusion, or hemodynamically significant stenosis in the neck. I cannot tell if orthostatics were actually checked during that visit but she was treated with IV fluids with improvement and discharged.    She was seen back in the ED on 09/08/2023 for elevated BP as high as 214/90 with an associated headache and dizziness. EKG showed no acute ischemic changes and high-sensitivity troponin was 18. EKG showed no  acute ischemic changed. She denied any chest pain or anginal symptoms. Borderline troponin felt to be due to demand ischemia in setting of markedly elevated BP. Her BP improved and she was asymptomatic so was felt to be stable for discharge.  She has been evaluated by ENT at [facility] for her dizziness and work-up was unremarkable. Orthostatics were negative. Vestibular testing was offered (although there was not a strong suspicion of this) but patient declined. No vestibular suppressants were recommended. 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 I cannot see the full notes from this; however, patient reported plan is for routine monitoring of aneurysm,  but it is not felt to be causing any issues. She states she was told that she "will likely die with the saccular aneurysm but NOT from it."    She was last seen by me on 09/16/2023 at which time she continue to report intermittent dizziness which seemed to be related to when her BP is high. However, she also felt like the Amlodipine was making her dizziness worse. She reported her BP was usually very high in the morning with systolic BP in the 170s (but occasionally as high as the 200s).  She would take her Amlodipine 2.5mg  and her BP would usually come down to the 140s/70s with this but then would continue to go up and down throughout the day (suspect this is due to anxiety). She stated  she would then drink a glass of wine in the afternoon and her systolic BP is usually in the 045W in the evening before she even takes her evening BP medications which are Irbesartan 75mg  and Toprol-XL 25mg . She continued to struggle with significant anxiety and stress since the death of her husband a couple of years prior. She was overall stable from a cardiac standpoint. She described some atypical chest pain which was not felt to be cardiac in nature. Her Amlodipine was stopped given she thought  this was contributing to her dizziness. Toprol-XL was stopped and she was switched to Coreg. I did offer a Zio monitor for further evaluation of dizziness (even though I do not think this is due to an arrhythmia) but patient declined.   Patient presents today for follow-up. She is here today with her daughter. Her BP remains very elevated and she is frustrated by this. Initial BP in the office was 180/70 and then 190/80 on my personal recheck at the end of visit. She also brought her home BP machine in and it read 185/65. Reviewed home BP log. She has been taking her BP first thing in the morning and then around 2 hours later after taking her medications. Systolic BP is mostly in the 160s to 190s first thing in the morning and then mostly in the 150s to 180s 2 hours after taking her medications. She has had occasional systolic BP in the 200s even after taking her medications. She tried increasing her Irbesartan to 150mg  daily but did not tolerate this - she states it made her fill sick on her stomach. She also tried taking a half dose of Valium at night but also states this made her feel bed. She did try to take a dose of her Amlodipine 2.5mg  one morning and her BP did droip quickly with this; however, she felt like it caused her to feel dizzy. She does have chronic intermittent dizziness and does not remember how much worse her dizziness was when taking the Amlodipine compared to her baseline. No syncope. She continues to have atypical chest pain and shortness of breath associated with anxiety but new or worsening symptoms.   I suspect anxiety is playing a large role in her BP being difficult to control. She describes being very anxious to check her BP and dreading doing that first thing in the morning.   The patient also reports occasional episodes of a racing heart, particularly at night, and has previously experienced this with other medications such as benazepril and losartan. She has a history of  intolerance to several medications, including hydralazine, which was discontinued due to perceived increase in blood pressure and racing heart.   EKGs/Labs/Other Studies Reviewed:    The following studies were reviewed:  N/A  EKG:  EKG not ordered today.   Recent Labs: 09/08/2023: ALT 18; BUN 19; Creatinine, Ser 0.51; Hemoglobin 14.6; Magnesium 2.1; Platelets 254; Potassium 3.6; Sodium 137  Recent Lipid Panel No results found for: "CHOL", "TRIG", "HDL", "CHOLHDL", "VLDL", "LDLCALC", "LDLDIRECT"  Physical Exam:    Vital Signs: BP (!) 185/65 Comment: home machine  Pulse 60   Ht 5' (1.524 m)   Wt 104 lb (47.2 kg)   BMI 20.31 kg/m     Wt Readings from Last 3 Encounters:  10/11/23 104 lb (47.2 kg)  09/16/23 102 lb (46.3 kg)  09/08/23 100 lb (45.4 kg)     General: 87 y.o. thin Caucasian female in no acute distress. HEENT: Normocephalic and atraumatic.  Sclera clear.  Neck: Supple. No JVD. Heart: RRR. Distinct S1 and S2. No murmurs, gallops, or rubs.  Lungs: No increased work of breathing. Clear to ausculation bilaterally. No wheezes, rhonchi, or rales.  Abdomen: Soft, non-distended, and non-tender to palpation.  Extremities: No lower extremity edema.   Skin: Warm and dry. Neuro: No focal deficits. Psych: Normal affect. Responds appropriately.   Assessment:    1. Primary hypertension   2. Dizziness   3. Hyperlipidemia, unspecified hyperlipidemia type   4. Type 2 diabetes mellitus without obesity (HCC)   5. Tobacco abuse   6. Anxiety     Plan:    Hypertension Patient has a long history of hypertension that has been difficult to control due to numerous side effects and some orthostatic symptoms in the past. Anxiety also felt to be playing a large role in the past.  - BP remains markedly elevated.  Initial BP in the office was 180/70 and then 190/80 on my personal recheck at the end of visit. She also brought her home BP machine in and it read 185/65. See home BP readings  above. - Current medications: Irbesartan 75mg  daily (at bedtime) and Coreg 3.125mg  twice daily.  - She does state Amlodipine does the best job at lowering her BP but she thinks this causes her to feel dizzy (although she does has had intermittent dizziness since RSV vaccine earlier this year).  - We discussed different options including retrying Amlodipine but taking at night, trying a different ARB such as Valsartan given Irbesartan is causing her some stomach upset, and retrying Hydralazine but on PRN basis (she was previously on Hydralazine and this was stopped because patient felt like it was causing her BP to be higher and causing heart racing but I wonder if this was actually due to anxiety. Patient would like to retry Amlodipine 2.5mg  daily but will take it before going to bed. She will continue Irbesartan 75mg  daily but start taking this in the morning. She will continue Coreg 3.125mg  twice daily - she seems to be tolerating this well but unable to increase due to baseline heart rates in the 50s to 60s.  - She will continue to keep a log of her BP/HR. She already has a follow-up visit with Dr. Royann Campos scheduled for later this month. - She does have underlying hypertension but suspect her anxiety is playing a large role. Discussed ways to help relax prior to checking BP.   Dizziness Patient reports intermittent dizziness since getting an RSV vaccine in 05/2023. It seems to be related to when her BP is high. She has been seen by ENT and it is not felt to vestibular in nature. Vestibular testing was offered but patient declined.  - She continues to have intermittent dizziness but this is stable.  - I do not think her dizziness is due to any arrhythmias. I did offer to order a Zio monitor at last visit but she declined.   Atypical Chest Pain Patient continues to have atypical chest pain when she is anxious. Unchanged from last visit.  - Does not sound cardiac in nature. No additional ischemic  work-up necessary at this time.   Hyperlipidemia Last lipid panel in 05/2023 (per [facility]): Total Cholesterol 239, Triglycerides 158, HDL 83, LDL 129. - She has declined to try statins in the past due to side effects with multiple medications.  - Did not discuss today.   Type 2 Diabetes Mellitus  Hemoglobin A1c 6.1%. - Management per PCP.  Tobacco Abuse Patient has a long smoking history.  - Provided Nicotine patches at visit last month.  - Did not have time to discuss today.    Anxiety Patient has severe anxiety which I think is the root of most of her symptoms as well as her hypertension. Her PCP recently prescribed Valium but she has concerns about this. I do think she would greatly benefit from medications for her anxiety but I will leave treatment of this to her PCP. I did offer referral to Psychiatry (which some of my colleagues have offered in the past) at visit last month but she declined.    **I spent 64 minutes seeing patient today, counseling patient on her hypertension and anxiety, answering questions about medications concerns, and documenting in the chart.**   Disposition: Patient already has a follow-up visit with Dr. Royann Campos scheduled for 10/26/2023.    Signed, Corrin Parker, PA-C  10/11/2023 4:36 PM    [facility]

## 2023-10-01 ENCOUNTER — Telehealth: Payer: Self-pay | Admitting: Cardiovascular Disease

## 2023-10-01 NOTE — Telephone Encounter (Signed)
Patient is returning phone call back in regards to elevated bp

## 2023-10-01 NOTE — Telephone Encounter (Signed)
Please disregard

## 2023-10-01 NOTE — Telephone Encounter (Signed)
Pt c/o medication issue:  1. Name of Medication: carvedilol (COREG) 3.125 MG tablet   2. How are you currently taking this medication (dosage and times per day)?    3. Are you having a reaction (difficulty breathing--STAT)? no  4. What is your medication issue? Patient is still having elevated bp. Calling to see if she should take an extra pill.

## 2023-10-01 NOTE — Telephone Encounter (Signed)
Spoke to patient who reports he BP has been elevated over the last week. She states she does have a headache but think it may be from allergies. She deny any other symptoms. Per the DOD Dr Carmon Ginsberg he advised her to increase her Irbesartan to 150 mg at bedtime. Message relayed to patient who verbalized understanding and agree.   BP readings  10/29   196/89 - 70 10/30    189/89 10/31  191/88 -65 11/1    191/90-63             196/65-63  2 hours after taking medication

## 2023-10-01 NOTE — Telephone Encounter (Signed)
Attempted tp call patent with no answer. Unable to leave message,.

## 2023-10-05 ENCOUNTER — Telehealth: Payer: Self-pay | Admitting: Cardiovascular Disease

## 2023-10-05 DIAGNOSIS — Z1231 Encounter for screening mammogram for malignant neoplasm of breast: Secondary | ICD-10-CM | POA: Diagnosis not present

## 2023-10-05 DIAGNOSIS — R92333 Mammographic heterogeneous density, bilateral breasts: Secondary | ICD-10-CM | POA: Diagnosis not present

## 2023-10-05 NOTE — Telephone Encounter (Signed)
Pt c/o BP issue:  1. What are your last 5 BP readings? 195/85 this morning, 195/87 yesterday morning 195/85, 184/85 11/3,  2. Are you having any other symptoms (ex. Dizziness, headache, blurred vision, passed out)? No, has been having on/off dizziness for weeks 3. What is your medication issue? Taking 2 ibersartan @ bedtime instead of 1  irbesartan (AVAPRO) 75 MG tablet Take 1 tablet (75 mg total) by mouth at bedtime.

## 2023-10-05 NOTE — Telephone Encounter (Signed)
Spoke to patient who agrees she is having anxiety about he BP being elevated.Her blood pressure does come down but not always in the 2 hours after taking her medication. She stated she willing try the Amlodipine 2.5 mg but will wait until she come back from getting her mammogram. She will keep her appt on 11/11 and see if recommendations help with her BP.

## 2023-10-05 NOTE — Telephone Encounter (Signed)
I suspect anxiety is playing a large role. Agree with you. She should take her BP sitting down not standing up. Is her BP still coming down nicely after she takes her morning medications? Has her dizziness improved any off of her Amlodipine? If not, I would have her restart her Amlodipine 2.5mg  daily.   I think it is fine for her to keep her visit on 11/11 with me. If she really wants to be seen before then, it will have to be with another APP as I am not back in the office until 11/11. Please have her bring in her home BP cuff to her upcoming visit so that we can make sure this is accurate.   Thank you!

## 2023-10-05 NOTE — Telephone Encounter (Signed)
Spoke to patient who continue you have elevated BP despite increasing her Irbesartan to 150 mg at bed time. She also continue to have dizziness and since she started on the increased Irbesartan she is having an upset stomach. She had been standing up taking her BP. Advised her the correct way of taking her BP. She deny any other symptoms at this time. Patient verbalized understanding and agree. She still would like to know if she could increase her Carvedilol. She has an appt on Monday with Callie Good rich and want to know if should come in sooner or is it okay to await. Please advise

## 2023-10-05 NOTE — Telephone Encounter (Signed)
Thanks for seeing her, [facility]

## 2023-10-08 ENCOUNTER — Telehealth: Payer: Self-pay

## 2023-10-08 NOTE — Telephone Encounter (Signed)
Transition Care Management Unsuccessful Follow-up Telephone Call  Date of discharge and from where:  [facility] 10/9  Attempts:  1st  Reason for unsuccessful TCM follow-up call:  No answer/busy     Lenard Forth [place]  [facility], [facility] Guide, Phone: [phone] Website: [email or web address]

## 2023-10-11 ENCOUNTER — Ambulatory Visit: Payer: Medicare Other | Attending: Student | Admitting: Student

## 2023-10-11 ENCOUNTER — Telehealth: Payer: Self-pay

## 2023-10-11 ENCOUNTER — Encounter: Payer: Self-pay | Admitting: Student

## 2023-10-11 VITALS — BP 185/65 | HR 60 | Ht 60.0 in | Wt 104.0 lb

## 2023-10-11 DIAGNOSIS — E119 Type 2 diabetes mellitus without complications: Secondary | ICD-10-CM | POA: Diagnosis not present

## 2023-10-11 DIAGNOSIS — R42 Dizziness and giddiness: Secondary | ICD-10-CM

## 2023-10-11 DIAGNOSIS — I10 Essential (primary) hypertension: Secondary | ICD-10-CM | POA: Diagnosis not present

## 2023-10-11 DIAGNOSIS — Z720 Tobacco use: Secondary | ICD-10-CM | POA: Diagnosis not present

## 2023-10-11 DIAGNOSIS — E785 Hyperlipidemia, unspecified: Secondary | ICD-10-CM | POA: Diagnosis not present

## 2023-10-11 DIAGNOSIS — F419 Anxiety disorder, unspecified: Secondary | ICD-10-CM

## 2023-10-11 NOTE — Telephone Encounter (Signed)
Transition Care Management Unsuccessful Follow-up Telephone Call  Date of discharge and from where: [facility] 10/9  Attempts:  2nd Attempt  Reason for unsuccessful TCM follow-up call: No answer   [facility]  [facility], [facility] Guide, Phone: [phone] Website: [email or web address]

## 2023-10-11 NOTE — Patient Instructions (Signed)
Medication Instructions:  Take Amlodipine Take  At Bedtime. Take Ibesartan Take In The Morning *If you need a refill on your cardiac medications before your next appointment, please call your pharmacy*   Lab Work: No Labs If you have labs (blood work) drawn today and your tests are completely normal, you will receive your results only by: MyChart Message (if you have MyChart) OR A paper copy in the mail If you have any lab test that is abnormal or we need to change your treatment, we will call you to review the results.   Testing/Procedures: No Testing   Follow-Up: At [facility], you and your health needs are our priority.  As part of our continuing mission to provide you with exceptional heart care, we have created designated Provider Care Teams.  These Care Teams include your primary Cardiologist (physician) and Advanced Practice Providers (APPs -  Physician Assistants and Nurse Practitioners) who all work together to provide you with the care you need, when you need it.  We recommend signing up for the patient portal called "MyChart".  Sign up information is provided on this After Visit Summary.  MyChart is used to connect with patients for Virtual Visits (Telemedicine).  Patients are able to view lab/test results, encounter notes, upcoming appointments, etc.  Non-urgent messages can be sent to your provider as well.   To learn more about what you can do with MyChart, go to [email or web address].    Your next appointment:   Keep Scheduled Appointment  Provider:   Thurmon Fair, MD

## 2023-10-19 ENCOUNTER — Telehealth: Payer: Self-pay | Admitting: Student

## 2023-10-19 NOTE — Telephone Encounter (Signed)
Spoke to patient who is calling concerning her BP being elevated. She has been taking the Irbesartan twice daily but it continues to cause upset stomach. She reports when she takes it once a day she has no issues. She also report that when she takes the Amlodipine at night she wake up feeling dizzy in the morning. She stated her heart was pounding last night but she did not get up to check her pulse. She would like to know what she should do since her BP remain elevated and she is having issues with her medication. Please advise.     188/84 pule 58 this morning about 6:00 AM , 188/82 pulse 58 at 8:00 AM yesterday it was 196/86 pulse 58 210/82 pulse 62 this was yestefrday after taking her medicine

## 2023-10-19 NOTE — Telephone Encounter (Signed)
It is difficult because she has been unable to tolerate multiple medications. She is only supposed to be taking Irbesartan once a day not twice. However, since this is causing her stomach upset, we an stop this and try Valsartan which is in the same class of medication as Irbesartan. If she feels like she tolerates the Amlodipine better taking it in the morning, then that is fine for her to do. It does not look like she has ever tried Spironolactone so that is another option that we could do. However, I would not try switching her to Valsartan and trying Spironolactone at the same time since she is so sensitive to medications (because if she has a side effect we won't know which one it is from).   I would either have her stop Irbesartan and start Valsartan 80mg  daily OR continue Irbesartan and start Spironolactone 25mg  daily (whichever one she feels more comfortable with). She can continue taking Amlodipine 2.5mg  daily (but can take it in the morning if she feels like she tolerates this better than taking it at night). She should also continue Coreg 3.125mg  twice daily.   She should then make sure she keeps her visit with Dr. Royann Shivers on 10/26/2023.   Thank you! Tavon Corriher

## 2023-10-19 NOTE — Telephone Encounter (Signed)
Called and relayed message to patient. She finally decided that she will take Irbesartan and Coreg in the morning. She will take Amlodipine and Coreg at night. She feels even though the Amlodipine make her dizzy it not as bad in the morning ans it will be all day if she took it in the morning. She will see how this schedule works and follow up at her appt on 11/26.

## 2023-10-19 NOTE — Telephone Encounter (Signed)
Pt c/o BP issue: STAT if pt c/o blurred vision, one-sided weakness or slurred speech  1. What are your last 5 BP readings? 188/84 pule 58 this morning about 6:00 AM , 188/82 pulse 58 at 8:00 AM yesterday it was 196/86 pulse 58 210/82 pulse 62 this was yestefrday after taking her medicine  2. Are you having any other symptoms (ex. Dizziness, headache, blurred vision, passed out)? Headache think it might be sinus  3. What is your BP issue? Blood pressure is high

## 2023-10-20 NOTE — Telephone Encounter (Signed)
Patient identification verified by 2 forms. Marilynn Rail, RN    Called and spoke to patient  Patient states:   -BP today have been very high   -200/80  -197/79  -45 minutes ago BP was: 208/88  -BP Right now: 209/84 HR: 67  -she has a slight headache, unsure if related to sinus   -does not feel good  -has some dizziness, believed caused by amlodipine   -Last night took: Carvedilol 3.125mg  and amlodipine 2.5mg    -This morning took: Carvedilol 3.125mg  and Irbesartan 75mg    -BP issues have persisted   -Does not wish to present to ER  Patient denies:   -Chest pain   -SOB/difficulty breathing   -Visual changes  -New weakness   -new numbness/tingling  Informed patient RN will speak to Dr. Royann Shivers for recommendations  Patient agrees with plan, no questions at this time

## 2023-10-20 NOTE — Telephone Encounter (Signed)
RN spoke to Dr. Royann Shivers  Dr. Royann Shivers states:   -Advise patient to increase Irbesartan 150mg  daily   -can take 4 days to see effects of Irbesartan   -Keep 11/26 appointment for follow up  _________________________________________ Patient identification verified by 2 forms. Marilynn Rail, RN    Called and spoke to patient  Informed patient:   -Take additional dose of Irbesartan 75mg  right now   -Tomorrow start taking Irbesartan 150mg  daily   -continue with Carvedilol 3.125mg  BID, Amlodipine 2.5mg    -Keep 11/26 appointment to follow up  Patient read back instruction/recommendations and verbalized understanding  Reviewed ED warning signs/precautions  Patient verbalized understanding, no questions at this time

## 2023-10-20 NOTE — Telephone Encounter (Signed)
Pt states medications aren't not working and wants to try something else. Please advise    BP readings for today : 200/80 197/79 208/88

## 2023-10-26 ENCOUNTER — Encounter: Payer: Self-pay | Admitting: Cardiovascular Disease

## 2023-10-26 ENCOUNTER — Ambulatory Visit: Payer: Medicare Other | Attending: Cardiovascular Disease | Admitting: Cardiovascular Disease

## 2023-10-26 ENCOUNTER — Ambulatory Visit: Payer: Medicare Other | Admitting: Student

## 2023-10-26 VITALS — BP 166/78 | HR 57 | Ht 60.0 in | Wt 108.2 lb

## 2023-10-26 DIAGNOSIS — I10 Essential (primary) hypertension: Secondary | ICD-10-CM | POA: Diagnosis not present

## 2023-10-26 DIAGNOSIS — E7800 Pure hypercholesterolemia, unspecified: Secondary | ICD-10-CM

## 2023-10-26 MED ORDER — OLMESARTAN MEDOXOMIL 20 MG PO TABS: 20.0000 mg | ORAL_TABLET | Freq: Every evening | ORAL | 3 refills | Status: DC

## 2023-10-26 NOTE — Progress Notes (Unsigned)
Cardiology office Note    Date:  10/28/2023   ID:  Kara Campos, DOB [birth year], MRN [id number]  PCP:  Irena Reichmann, DO  Cardiologist:  Thurmon Fair, MD  Electrophysiologist:  None   Evaluation Performed:  Follow-Up Visit  Chief Complaint:  HTN  History of Present Illness:    Kara Campos is a 87 y.o. female with longstanding mild essential hypertension, that has been difficult to treat due to numerous side effects, with frequent adjustments in her medical regimen.  Currently she reports that she is taking amlodipine 2.5 mg daily, irbesartan 150 mg (75 mg x2) daily and carvedilol 3.125 mg twice daily.  She brought in a detailed list of her blood pressure and heart rates over the last 2 weeks  It has been a couple of years since her husband passed away and she continues to find it difficult to live alone.  She is thinking about moving to an independent living community.  Her blood pressure is persistently high, typically in the 180-190/80s.  Only on 2 occasions in the last couple of weeks as her blood pressure been in normal range, usually in the afternoons.  Typically this has happened when she is relaxed after drinking a glass of wine.  Her heart rate is typically in the high 50s/low 60s so she can only take a very low-dose of beta-blocker.  She send problems of multiple blood pressure medications, primarily complaining of dizziness in a pattern consistent with orthostatic hypotension.  She has a very broad pulse pressure of roughly 100 mmHg.  She is allergic to thiazides.  She has gained back a little bit of weight but remains very lean with a BMI of 21.  She cannot take statins due to muscle side effects, even when she takes them with coq.10.   Past Medical History:  Diagnosis Date   Arthritis    Blood transfusion    Cataract    Diabetes mellitus    borderline   Hemorrhoids    Hyperlipidemia    Osteoporosis    Past Surgical History:  Procedure Laterality Date    TOTAL HIP ARTHROPLASTY Left 02/13/2017   Procedure: TOTAL HIP ARTHROPLASTY ANTERIOR APPROACH;  Surgeon: Durene Romans, MD;  Location: [facility];  Service: Orthopedics;  Laterality: Left;   VAGINAL HYSTERECTOMY  1985     Current Meds  Medication Sig   amLODipine (NORVASC) 2.5 MG tablet Take 2.5 mg by mouth daily.   carvedilol (COREG) 3.125 MG tablet Take 1 tablet (3.125 mg total) by mouth 2 (two) times daily.   Cholecalciferol (VITAMIN D3) 50 MCG (2000 UT) TABS Take 2,000 Units by mouth every other day.   CLARITIN 10 MG tablet Take 10 mg by mouth daily as needed for allergies or rhinitis.   Cyanocobalamin (VITAMIN B-12 SL) Place 1 tablet under the tongue daily.   fish oil-omega-3 fatty acids 1000 MG capsule Take 1 g by mouth every other day.   Lactobacillus (PROBIOTIC ACIDOPHILUS PO) Take 1 capsule by mouth at bedtime.   magnesium gluconate (MAGONATE) 500 MG tablet Take 500 mg by mouth every other day.   olmesartan (BENICAR) 20 MG tablet Take 1 tablet (20 mg total) by mouth at bedtime.   timolol (BETIMOL) 0.5 % ophthalmic solution Place 1 drop into both eyes 2 (two) times daily.   [DISCONTINUED] irbesartan (AVAPRO) 75 MG tablet Take 1 tablet (75 mg total) by mouth at bedtime.     Allergies:   Codeine, Nitrofurantoin macrocrystal, Sulfa antibiotics, and Tape  Social History   Tobacco Use   Smoking status: Every Day    Current packs/day: 0.50    Types: Cigarettes   Smokeless tobacco: Never   Tobacco comments:    10/26/2023 Patient smokes about a fourth of a pack daily  Vaping Use   Vaping status: Never Used  Substance Use Topics   Alcohol use: Yes    Comment: Occas   Drug use: No     Family Hx: The patient's family history includes Ovarian cancer in her maternal grandmother.  ROS:   Please see the history of present illness.     All other systems reviewed and are negative.   Prior CV studies:   The following studies were reviewed today:    Labs/Other Tests and Data  Reviewed:    EKG:   Recent Labs: 09/08/2023: ALT 18; BUN 19; Creatinine, Ser 0.51; Hemoglobin 14.6; Magnesium 2.1; Platelets 254; Potassium 3.6; Sodium 137  07/20/2018  Creatinine 0.62, potassium 4.4 11/10/2019  TSH 2.22 11/13/2020 TSH 2.89, creatinine 0.6, hemoglobin A1c 5.9% Recent Lipid Panel No results found for: "CHOL", "TRIG", "HDL", "CHOLHDL", "LDLCALC", "LDLDIRECT" 11/10/2019 HDL 80, triglycerides 205 16/08/9603 cholesterol 249, HDL 82, LDL 130, triglycerides 213 05/20/2021 cholesterol 225, HDL 73, triglycerides 166  Wt Readings from Last 3 Encounters:  10/26/23 108 lb 3.2 oz (49.1 kg)  10/11/23 104 lb (47.2 kg)  09/16/23 102 lb (46.3 kg)     Objective:    Vital Signs:  BP (!) 166/78 (BP Location: Left Arm, Patient Position: Sitting, Cuff Size: Normal)   Pulse (!) 57   Ht 5' (1.524 m)   Wt 108 lb 3.2 oz (49.1 kg)   SpO2 97%   BMI 21.13 kg/m     General: Alert, oriented x3, no distress, very lean Head: no evidence of trauma, PERRL, EOMI, no exophtalmos or lid lag, no myxedema, no xanthelasma; normal ears, nose and oropharynx Neck: normal jugular venous pulsations and no hepatojugular reflux; brisk carotid pulses without delay and no carotid bruits Chest: clear to auscultation, no signs of consolidation by percussion or palpation, normal fremitus, symmetrical and full respiratory excursions Cardiovascular: normal position and quality of the apical impulse, regular rhythm, normal first and second heart sounds, no murmurs, rubs or gallops Abdomen: no tenderness or distention, no masses by palpation, no abnormal pulsatility or arterial bruits, normal bowel sounds, no hepatosplenomegaly Extremities: no clubbing, cyanosis or edema; 2+ radial, ulnar and brachial pulses bilaterally; 2+ right femoral, posterior tibial and dorsalis pedis pulses; 2+ left femoral, posterior tibial and dorsalis pedis pulses; no subclavian or femoral bruits Neurological: grossly nonfocal Psych: Normal  mood and affect    ASSESSMENT & PLAN:    1. HTN: Will try slightly more potent ARB and continue the very low-dose carvedilol.  I do not think we will be able to reach "target" systolic blood pressure less than 140, probably because she has a tendency to develop diastolic hypotension when her systolic is at low.  I suspect this is causing her dizziness, especially with changes in position.  I think we will probably have to satisfy ourselves with a systolic blood pressure around 160 (her diastolic blood pressure would probably be in the 50-60 range then).  Start olmesartan instead of irbesartan. 2. HLP:   She does not have known CAD or PAD.  Has an excellent HDL.  She does not want to try statins again due to side effects with multiple medications.   Medication Changes: Meds ordered this encounter  Medications  olmesartan (BENICAR) 20 MG tablet    Sig: Take 1 tablet (20 mg total) by mouth at bedtime.    Dispense:  90 tablet    Refill:  3   Patient Instructions  Medication Instructions:  STOP Irbesartan START Olmesartan 20 mg every night (take it every night along with your Amlodipine) *If you need a refill on your cardiac medications before your next appointment, please call your pharmacy*  Send a BP log in one week.   Follow-Up: At [facility], you and your health needs are our priority.  As part of our continuing mission to provide you with exceptional heart care, we have created designated Provider Care Teams.  These Care Teams include your primary Cardiologist (physician) and Advanced Practice Providers (APPs -  Physician Assistants and Nurse Practitioners) who all work together to provide you with the care you need, when you need it.  We recommend signing up for the patient portal called "MyChart".  Sign up information is provided on this After Visit Summary.  MyChart is used to connect with patients for Virtual Visits (Telemedicine).  Patients are able to view lab/test  results, encounter notes, upcoming appointments, etc.  Non-urgent messages can be sent to your provider as well.   To learn more about what you can do with MyChart, go to [email or web address].    Your next appointment:   Marjie Skiff, PA in 1 month  Dr Royann Shivers in 6 months Signed, Thurmon Fair, MD  10/28/2023 9:52 AM    [facility]

## 2023-10-26 NOTE — Patient Instructions (Signed)
Medication Instructions:  STOP Irbesartan START Olmesartan 20 mg every night (take it every night along with your Amlodipine) *If you need a refill on your cardiac medications before your next appointment, please call your pharmacy*  Send a BP log in one week.   Follow-Up: At [facility], you and your health needs are our priority.  As part of our continuing mission to provide you with exceptional heart care, we have created designated Provider Care Teams.  These Care Teams include your primary Cardiologist (physician) and Advanced Practice Providers (APPs -  Physician Assistants and Nurse Practitioners) who all work together to provide you with the care you need, when you need it.  We recommend signing up for the patient portal called "MyChart".  Sign up information is provided on this After Visit Summary.  MyChart is used to connect with patients for Virtual Visits (Telemedicine).  Patients are able to view lab/test results, encounter notes, upcoming appointments, etc.  Non-urgent messages can be sent to your provider as well.   To learn more about what you can do with MyChart, go to [email or web address].    Your next appointment:   Marjie Skiff, PA in 1 month  Dr Royann Shivers in 6 months

## 2023-10-27 ENCOUNTER — Telehealth: Payer: Self-pay | Admitting: Cardiovascular Disease

## 2023-10-27 NOTE — Telephone Encounter (Signed)
Pt c/o medication issue:  1. Name of Medication:   New medication  2. How are you currently taking this medication (dosage and times per day)?   3. Are you having a reaction (difficulty breathing--STAT)?   4. What is your medication issue?   Patient stated she had a medication change yesterday and now has some questions.

## 2023-10-27 NOTE — Telephone Encounter (Signed)
Called and spoke patient who stated that she could not pick up her Olmesartan yesterday due to it being out of stock and it had to be ordered. Patient stated she did take Irbesartan last night. She do expect medication to be in today. She asked if she should take her Carvedilol this morning. Advised her to take medication as instructed by Dr Royann Shivers. Patient verbalized understanding and agree.She will call pharmacy to see when will her medication arrive. I went over medications with patient and informed her:  Carvedilol 3.125 mg twice daily Amlodipine 2.5 mg at night with Olmesartan Olmesartan 20 mg nightly.

## 2023-11-03 ENCOUNTER — Telehealth: Payer: Self-pay | Admitting: Cardiovascular Disease

## 2023-11-03 MED ORDER — AMLODIPINE BESYLATE 2.5 MG PO TABS: 2.5000 mg | ORAL_TABLET | Freq: Every evening | ORAL | 3 refills | Status: DC

## 2023-11-03 MED ORDER — OLMESARTAN MEDOXOMIL 40 MG PO TABS: 40.0000 mg | ORAL_TABLET | Freq: Every evening | ORAL | 3 refills | Status: DC

## 2023-11-03 NOTE — Telephone Encounter (Signed)
Called patient and went over Dr Croitoru's recommendations based on her BP readings and questions/concerns: Increase Olmesartan to 40 mg- patient will take this at night (dose change sent into pharmacy/'fill later" patient will double her 20 mg tabs to equal 40 mg and will let pharmacy know when she needs filled) she asked for a refill to be sent of her amlodipine- refill sent.  Sleep aid- Recommends trying Melatonin. She reports trying this before, but it was 5mg  and it made her feel "hung over," in the morning. She will try melatonin, but a lower dose this time. (1mg  or 3mg ).  Informed that cortisone creams will not affect BP unless used on very extensive areas of skin.   She verbalized understanding of all information/recommendations.

## 2023-11-03 NOTE — Telephone Encounter (Signed)
Patient dropped off bp reading with questions attached  In providers box

## 2023-11-19 DIAGNOSIS — Z961 Presence of intraocular lens: Secondary | ICD-10-CM | POA: Diagnosis not present

## 2023-11-19 DIAGNOSIS — H04123 Dry eye syndrome of bilateral lacrimal glands: Secondary | ICD-10-CM | POA: Diagnosis not present

## 2023-11-19 DIAGNOSIS — H35373 Puckering of macula, bilateral: Secondary | ICD-10-CM | POA: Diagnosis not present

## 2023-11-19 DIAGNOSIS — H401132 Primary open-angle glaucoma, bilateral, moderate stage: Secondary | ICD-10-CM | POA: Diagnosis not present

## 2023-11-20 DIAGNOSIS — M25531 Pain in right wrist: Secondary | ICD-10-CM | POA: Diagnosis not present

## 2023-11-22 DIAGNOSIS — M25531 Pain in right wrist: Secondary | ICD-10-CM | POA: Diagnosis not present

## 2023-11-23 ENCOUNTER — Telehealth: Payer: Self-pay | Admitting: Cardiovascular Disease

## 2023-11-23 NOTE — Telephone Encounter (Signed)
Appears she was at [facility] yesterday and discussed X-rays. Recommend she contact their office for pain relief options

## 2023-11-23 NOTE — Telephone Encounter (Signed)
Patient identification verified by 2 forms. Marilynn Rail, RN    Called and spoke to patient  Patient states:   -she has a hairline fracture in right wrist   -is taking tylenol extra strength, but no relief to pain/inflammation   -unsure if she should take 200mg  Advil   -would like to know if there are better recommendations for pain management  Informed patient message sent to pharmacy for assistance  Patient verbalized understanding, no questions at this time

## 2023-11-23 NOTE — Telephone Encounter (Signed)
Cheree Ditto, [facility]  You13 minutes ago (9:59 AM)    I would prefer she not until she talks to ortho. Can cause BP increases with her olmesartan   You  Pavero, Cristal Deer, RPH54 minutes ago (9:18 AM)    Would it be okay for her to take the 200mg  advil?

## 2023-11-23 NOTE — Telephone Encounter (Signed)
Patient identification verified by 2 forms. Marilynn Rail, RN    Called and spoke to patient  Relayed message below  Patient states:   -advil was recommended by Ortho provider  -ortho also recommended Tramadol  RN advised patient, per pharmacy to follow up with ortho  Patient stated "Thank you for nothing" and disconnected the call

## 2023-11-23 NOTE — Telephone Encounter (Signed)
Pt states she needs to speak with a nurse about taking 200mg  Advil. Please advise

## 2023-11-28 NOTE — Progress Notes (Signed)
 Cardiology Office Note:    Date:  12/07/2023   ID:  Kara Campos, DOB [birth year], MRN [id number]  PCP:  Gerome Brunet, DO  Cardiologist:  Jerel Balding, MD     Referring MD: Gerome Brunet, DO   Chief Complaint: follow up of hypertension  History of Present Illness:    Kara Campos is a 87 y.o. female with a history of hypertension, hyperlipidemia, type 2 diabetes, IBS, and anxiety who is followed by Dr. Balding and presents today for follow up of hypertension.    Patient is followed by Dr. Balding for hypertension. She has a long history of hypertension that has been difficult to control due to numerous side effects to medications and orthostatic symptoms in the past. Anxiety has also been felt to be a large contributor. She has had frequent adjustments in her medical regimen. She has only been able to tolerate low doses of beta-blockers in the past due to fatigue and has only been able to tolerate 2.5mg  daily of Amlodipine  given edema and nausea with higher doses. She also has had a lot of stress/ anxiety over the last couple of years has been felt to be contributing to her higher BP.    She was seen in the ED on 08/07/2023 for dizziness for the past 6 weeks after getting the RSV vaccine. It was worse with position changes. Dix-Hallpike maneuver was negative. Head/ neck CTA moderate narrowing of the P1 segment of the left PCA and a 3x2 mm posteriorly projecting outpouching of the supraclinoid ICA on the right suspicious for a saccular aneurysm but no acute intracranial abnormality, intracranial large vessel occlusion, or hemodynamically significant stenosis in the neck. I cannot tell if orthostatics were actually checked during that visit but she was treated with IV fluids with improvement and discharged.    She was seen back in the ED on 09/08/2023 for elevated BP as high as 214/90 with an associated headache and dizziness. EKG showed no acute ischemic changes and high-sensitivity troponin  was 18. EKG showed no acute ischemic changed. She denied any chest pain or anginal symptoms. Borderline troponin felt to be due to demand ischemia in setting of markedly elevated BP. Her BP improved and she was asymptomatic so was felt to be stable for discharge.   She has been evaluated by ENT at [facility] for her dizziness and work-up was unremarkable. Orthostatics were negative. Vestibular testing was offered (although there was not a strong suspicion of this) but patient declined. No vestibular suppressants were recommended. 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 I cannot see the full notes from this; however, patient reported plan is for routine monitoring of aneurysm,  but it is not felt to be causing any issues. She states she was told that she will likely die with the saccular aneurysm but NOT from it.    She has been seen by me a couple of times since her last ED visit and BP has remained markedly elevated. Different medication changes were attempted. Low dose Amlodipine  was stopped because patient felt like this was contributing to her dizziness. She was last seen by Dr. Balding on 10/26/2023 at which time BP was running persistently high at home with systolic BP typically in the 180s to 190s. Irbesartan  was stopped and she was started on Olmesartan . Her dizziness was felt to be due to diastolic hypotension so plan was to aim for a systolic  BP around 160.   Patient presents today for follow-up. Here with her daughter.  Her BP is actually well-controlled today at 140/68; however, systolic BP mostly in the 170s at home.  She is very curious concerned about being on Coreg  she feels like this is causing her significant dizziness especially after the morning dose.  She feels like it is beginning to interfere with her ability to perform daily activities because she is scared of falling.  She does not seem to have  significant issues when she takes the Coreg  but she goes to sleep afterward.  She states this dizziness reminds her the symptoms she had that led to the 2 ED visits back in the fall.  She did have a mechanical fall shortly before Christmas resulting in a fractured right wrist and bruised ribs.  She states she was trying to move too quickly and bumped into a wall and then fell.  No dizziness prior to fall no syncope.  She also does not think she is tolerating the olmesartan  well.  She reports GI issues such as abdominal bloating and diarrhea with this.  However, she does have IBS and will frequently have GI symptoms like this.  She denies any chest pain or new or worsening shortness of breath.  No orthopnea, PND, lower extremity edema, palpitations.    EKGs/Labs/Other Studies Reviewed:    The following studies were reviewed:  N/A  EKG:  EKG not ordered today.   Recent Labs: 09/08/2023: ALT 18; BUN 19; Creatinine, Ser 0.51; Hemoglobin 14.6; Magnesium  2.1; Platelets 254; Potassium 3.6; Sodium 137  Recent Lipid Panel No results found for: CHOL, TRIG, HDL, CHOLHDL, VLDL, LDLCALC, LDLDIRECT  Physical Exam:    Vital Signs: BP (!) 140/68   Pulse 65   Ht 5' (1.524 m)   Wt 103 lb (46.7 kg)   SpO2 98%   BMI 20.12 kg/m     Wt Readings from Last 3 Encounters:  12/07/23 103 lb (46.7 kg)  10/26/23 108 lb 3.2 oz (49.1 kg)  10/11/23 104 lb (47.2 kg)     General: 87 y.o. thin Caucasian female in no acute distress. HEENT: Normocephalic and atraumatic. Sclera clear.  Neck: Supple.  No JVD. Heart: RRR. Distinct S1 and S2. No murmurs, gallops, or rubs.  Lungs: No increased work of breathing. Clear to ausculation bilaterally. No wheezes, rhonchi, or rales.  Extremities: No lower extremity edema.   Skin: Warm and dry. Neuro: No focal deficits. Psych: Normal affect. Responds appropriately.  Assessment:    1. Primary hypertension   2. Atypical chest pain   3. Hyperlipidemia,  unspecified hyperlipidemia type   4. Type 2 diabetes mellitus without obesity (HCC)     Plan:    Hypertension Dizziness Patient has a long history of hypertension that has been difficult to control due to numerous side effects and some orthostatic symptoms (dizziness) in the past. Most recently dizziness has been felt to be due to diastolic hypotension; therefore, systolic BP goal is around 160.  - BP well controlled in the office today at 140/68. Per home BP log, BP mostly in the 170s/ 70s-80s at home.  - Current medications: Amlodipine  2.5mg  daily (at night), Olmesartan  40mg  daily (at night), and Coreg  3.125mg  twice daily.   - She continues to have dizziness and thinks it is due to the Coreg  as it is significantly worse after she takes the morning dose of this. She is very hesitant to remain on this. We dicussed decreasing Coreg  to once a  daily given she seems to tolerate the evening dose OK vs switching back to Toprol -XL 25mg  daily. She would prefer to switch back to Toprol -XL so will do this.  - She also reports GI symptoms with Olmesartan  but she does have a history of IBS. She is okay with continuing this for now. However, if she continues to have GI symptoms, she may want to switch back to Irbesartan .    We had a long conversation about options going forwards. Unfortunately, we have limited options given her multiple medications intolerances. She has been unable to tolerate ACEi, HCTZ, Hydralazine , high dose of Amlodipine , and high dose of beta-blockers in the past. It does not look like she has tried Spironolactone, Clonidine, or Cardura in the past but I do not think that she would tolerate this well given frailty, advanced age, and dizziness felt to be due to diastolic hypotension. Will discuss option with Pharmacy and have her follow-up with Dr. Francyne.    Hyperlipidemia Last lipid panel in 05/2023 (per [facility]): Total Cholesterol 239, Triglycerides 158, HDL 83, LDL 129. - She has declined  to try statins in the past due to side effects with multiple medications.    Type 2 Diabetes Mellitus  Hemoglobin A1c 6.1% in 05/2023. - Management per PCP.  Disposition: Follow up with Dr. Francyne in about 1 month.    Signed, Javontae Marlette E Khushi Zupko, PA-C  12/07/2023 4:36 PM    [facility]  ADDENDUM: Initial plan at time of visit was to switch to Toprol -XL as above. However, I spoke with our Pharmacist (Chris Pavero) at the end of day. He agreed that he would be hesitant to try Spironolactone, Clonidine, or Cardura. He said we could try Propanolol as this would also help with patient's anxiety. I called and discussed this with patient who is agreeable to this. Therefore, will start Propranolol  10mg  twice daily instead of Toprol -XL. Patient will continue to take Amlodipine  2.5mg  daily and Olmesartan  40mg  daily at night.   I spent 55 minutes in the care of Kara Campos today including reviewing prior office visits regarding previous attempts at treating hypertension, face to face time discussing treatment options for her hypertension and dizziness, and documenting in the encounter. I then spent an additional 15 minutes on the phone with her at the end of the day discussing recommendations from Pharmacy.    Kara Campos E Tobie Hellen, PA-C 12/07/2023 4:36 PM

## 2023-12-02 ENCOUNTER — Telehealth: Payer: Self-pay | Admitting: Cardiovascular Disease

## 2023-12-02 NOTE — Telephone Encounter (Signed)
 Called and spoke to patient. Verified name and DOB. Patient is calling with concerns about taking Carvedilol . She reports when taking the medication she is waking up with dizziness, sedated feeling and she is off balance. Also 1 hour after taking the Carvedilol  she has the same felling. She has been on the medications since 10/27. She stated she had a fall on 12/20 and fractured her wrist. She is having pain and body aches from the fall. She also continue to have stomach issues from the Olmasartan. She reports she's having diarrhea, constipation and she is bloated. Her BP remains elevated on the medication regimen. Patient asked if her appointment for 1/7 with Callie Goodrich could be changed to a telephone visit because with her fracture wrist it is difficult to drive and she has no one to bring her. Please advise.  BP readings:  12/30 167/75 HR 70 12/31 176/72  HR 57 1/1      177/79  HR 57 2 hours after taking medication 1/2       183/80 HR 72 with no medication

## 2023-12-02 NOTE — Telephone Encounter (Signed)
 Pt c/o medication issue:  1. Name of Medication: carvedilol  (COREG ) 3.125 MG tablet   2. How are you currently taking this medication (dosage and times per day)? As written   3. Are you having a reaction (difficulty breathing--STAT)? No   4. What is your medication issue? Pt called in asking to speak with nurse about this medication.

## 2023-12-03 NOTE — Telephone Encounter (Signed)
 Called and spoke to patient. Verified name and DOB. Below message relayed. Patient verbalized understanding and agree. Patient will keep appointment for 1/7. Patient has transportation resource sheet from the office and will reach out to some listed for transportation.

## 2023-12-03 NOTE — Telephone Encounter (Signed)
 Patient identification verified by 2 forms. Bertina Cooks, RN    Called and spoke to patient  Patient states:    -would like to know when she can receive response  Informed patient per below, message sent to provider yesterday, at this time no update available, she will be outreached once recommendations received  Patient disconnected call

## 2023-12-03 NOTE — Telephone Encounter (Signed)
 Patient is calling to get update on message left yesterday. She states if she does not answer home phone to please call her cell # given below.  [phone]

## 2023-12-07 ENCOUNTER — Ambulatory Visit: Payer: Medicare Other | Attending: Student | Admitting: Student

## 2023-12-07 ENCOUNTER — Encounter: Payer: Self-pay | Admitting: Student

## 2023-12-07 ENCOUNTER — Telehealth: Payer: Self-pay | Admitting: Student

## 2023-12-07 VITALS — BP 140/68 | HR 65 | Ht 60.0 in | Wt 103.0 lb

## 2023-12-07 DIAGNOSIS — E119 Type 2 diabetes mellitus without complications: Secondary | ICD-10-CM | POA: Diagnosis not present

## 2023-12-07 DIAGNOSIS — R0789 Other chest pain: Secondary | ICD-10-CM

## 2023-12-07 DIAGNOSIS — E785 Hyperlipidemia, unspecified: Secondary | ICD-10-CM

## 2023-12-07 DIAGNOSIS — I10 Essential (primary) hypertension: Secondary | ICD-10-CM | POA: Diagnosis not present

## 2023-12-07 MED ORDER — PROPRANOLOL HCL 10 MG PO TABS: 10.0000 mg | ORAL_TABLET | Freq: Two times a day (BID) | ORAL | 2 refills | Status: DC

## 2023-12-07 MED ORDER — OLMESARTAN MEDOXOMIL 40 MG PO TABS: 40.0000 mg | ORAL_TABLET | Freq: Every evening | ORAL | 2 refills | Status: DC

## 2023-12-07 MED ORDER — METOPROLOL SUCCINATE ER 25 MG PO TB24: 25.0000 mg | ORAL_TABLET | Freq: Every day | ORAL | 3 refills | Status: DC

## 2023-12-07 NOTE — Patient Instructions (Addendum)
 Medication Instructions:  STOP CARVEDILOL  START METOPROLOL  SUCCINATE 25 MG EVERY NIGHT (START TOMORROW NIGHT) *If you need a refill on your cardiac medications before your next appointment, please call your pharmacy*  Follow-Up: At [facility], you and your health needs are our priority.  As part of our continuing mission to provide you with exceptional heart care, we have created designated Provider Care Teams.  These Care Teams include your primary Cardiologist (physician) and Advanced Practice Providers (APPs -  Physician Assistants and Nurse Practitioners) who all work together to provide you with the care you need, when you need it.  We recommend signing up for the patient portal called MyChart.  Sign up information is provided on this After Visit Summary.  MyChart is used to connect with patients for Virtual Visits (Telemedicine).  Patients are able to view lab/test results, encounter notes, upcoming appointments, etc.  Non-urgent messages can be sent to your provider as well.   To learn more about what you can do with MyChart, go to [email or web address].    Your next appointment:   with Dr Francyne

## 2023-12-07 NOTE — Telephone Encounter (Signed)
 Pt called in asking to speak with nurse. She is concerned about some med changes at her appt today and wants to know how she should be taking the Olmesartan.

## 2023-12-08 NOTE — Telephone Encounter (Signed)
 Thank you! Called and spoke with patient yesterday after clinic.

## 2023-12-13 ENCOUNTER — Telehealth: Payer: Self-pay | Admitting: Cardiovascular Disease

## 2023-12-13 MED ORDER — IRBESARTAN 75 MG PO TABS: 75.0000 mg | ORAL_TABLET | Freq: Every day | ORAL | 3 refills | Status: DC

## 2023-12-13 MED ORDER — METOPROLOL SUCCINATE ER 25 MG PO TB24: 25.0000 mg | ORAL_TABLET | Freq: Every day | ORAL | 3 refills | Status: DC

## 2023-12-13 NOTE — Telephone Encounter (Signed)
 Called and verified name and DOB. Patient states that recent started new meds are giving her side effects.  She states on the propanolol she feels dizzy and unstable for several hours after taking the morning dose. She is afraid to drive while taking the medication and has several upcoming MD appts. She wants to know if she can just return to taking the former dose of metoprolol  she had before.  She also states that the olmesartan  is still causing her significant stomach distress and wants to know if she can return to taking the ibesartan that she was on previously as well. I informed her that I would forward this message to JAYSON Door and the PharmD for recommendations.

## 2023-12-13 NOTE — Telephone Encounter (Signed)
 Pt notified stop the Propranolol and go back to Toprol-XL 25mg  daily and stop the Olmesartan and go back to Irbesartan 75mg  daily.  Verbalized understanding. New rx's added.

## 2023-12-13 NOTE — Telephone Encounter (Signed)
 Pt c/o medication issue:  1. Name of Medication:   propranolol  (INDERAL ) 10 MG tablet  olmesartan  (BENICAR ) 40 MG tablet   2. How are you currently taking this medication (dosage and times per day)?   3. Are you having a reaction (difficulty breathing--STAT)?   4. What is your medication issue?    Patient wants a call back to discuss these medications she was recently prescribed.

## 2023-12-13 NOTE — Addendum Note (Signed)
 Addended by: Alyson Ingles on: 12/13/2023 03:37 PM   Modules accepted: Orders

## 2023-12-13 NOTE — Telephone Encounter (Signed)
 She can stop the Propranolol and go back to Toprol-XL 25mg  daily. Also ok to stop the Olmesartan and go back to Irbesartan 75mg  daily.  Thank you!

## 2023-12-13 NOTE — Telephone Encounter (Signed)
 Please see 1/13 mychart message for documentation

## 2023-12-13 NOTE — Telephone Encounter (Signed)
 Pt said she had to run some errands and when the nurse calls back to also try her on her mobile number

## 2023-12-15 ENCOUNTER — Telehealth: Payer: Self-pay | Admitting: *Deleted

## 2023-12-15 DIAGNOSIS — M25531 Pain in right wrist: Secondary | ICD-10-CM | POA: Diagnosis not present

## 2023-12-15 DIAGNOSIS — S52501A Unspecified fracture of the lower end of right radius, initial encounter for closed fracture: Secondary | ICD-10-CM | POA: Diagnosis not present

## 2023-12-15 NOTE — Telephone Encounter (Signed)
 Spoke to patient   Patient states she came by office today after being seen at the [facility].   Patient stases she is dizzy today but this is not new. She recent has discuss this issue with Madolyn Schlatter PA   Patient states Metoprolol   25 mg , Irbesartan  75 mg , Amlodipine  2.5 mg. She took last night. Patient could not tell me the exact time only she did not take all the medication at the same time.  Patient stated her blood pressure this morning was 188/77  She states she has not take any medication today. She states she dizzy  it occurs at anytime.   RN suggested patient spreads out how she takes medication  record blood pressure to see if dizziness gets better - also keep hydrated - Amlodipine  2.5 mg at Lunch, Irbesartan  75 mg at supper,Metoprolol  25 mg at bedtime.  Check blood pressure after lunch and supper time or bedtime.   Patient  states she was thinking of doing that prior to this phone call. Patient is aware to contact office on Friday  for progress update,but is aware it may take several days to see if issue goes away. " I am would like for this to go away, I have been dealing with for a while"  Patient is aware information will go the both providers

## 2023-12-17 NOTE — Telephone Encounter (Signed)
Follow Up:     Pt c/o medication issue:  1. Name of Medication: Metoprolol, Amlodipine, and Irbesartan  2. How are you currently taking this medication (dosage and times per day)?   3. Are you having a reaction (difficulty breathing--STAT)?   4. What is your medication issue?  Patient called and wanted the nurse to know that she wants to take these medicine a little longer, until Monday. The first of the week she will call back with a progress report

## 2023-12-17 NOTE — Telephone Encounter (Signed)
Ok thank you! Her BP is very difficult to control. She has been unable to tolerate most medications that we have tried. We can wait to see what she says on Monday, but I will likely need to reach out to Dr. Royann Shivers for what he recommends.I am not sure what else to do for her at this point.  Thank you!

## 2023-12-21 ENCOUNTER — Telehealth: Payer: Self-pay | Admitting: Cardiovascular Disease

## 2023-12-21 NOTE — Telephone Encounter (Signed)
Patient identification verified by 2 forms. Marilynn Rail, RN    Called and spoke to patient  Patient states:   -has complaint of dizziness  -would like to know if something else could be causing her dizziness  -Amlodipine 2.5mg  at 12pm  -Irbesartan 75mg  at night   -Metoprolol  25mg  at night   -does not take any BP medications in the morning   -Typically wakes up feeling dizzy and has dizziness after taking Amlodipine at 12pm   -does not check BP at night before bed after taking Irbesartan and Metoprolol   -she is currently anxious and knows BP will be elevated   -1/20: 9am 176/73 67 HR   -1/20: 2pm 176/73 66HR   -1/19: 9am 167/73 68  -1/19: 2pm 164/72  -1/18: 9am 187/77 64Hr   -1/18: 2pm 163/72 68HR Informed patient message sent to Dr. Royann Shivers and PA Irene Limbo for input/advisement  Patient verbalized understanding, no questions at this time

## 2023-12-21 NOTE — Telephone Encounter (Signed)
If she agrees, you can place the referrals

## 2023-12-21 NOTE — Telephone Encounter (Signed)
Pt c/o medication issue:  1. Name of Medication: amLODipine (NORVASC) 2.5 MG tablet ; irbesartan (AVAPRO) 75 MG tablet ; metoprolol succinate (TOPROL XL) 25 MG 24 hr tablet   2. How are you currently taking this medication (dosage and times per day)?  Take 1 tablet (2.5 mg total) by mouth at bedtime.  Take 1 tablet (75 mg total) by mouth daily.  Take 1 tablet (25 mg total) by mouth daily.   3. Are you having a reaction (difficulty breathing--STAT)? No   4. What is your medication issue? Patient is complaining about dizziness about the BP medication she is taking

## 2023-12-21 NOTE — Telephone Encounter (Signed)
Although any BP medication can cause dizziness, changing her BP meds repeatedly has not had any impact on her complaints of dizziness. The BP readings are all mild-moderately high. Most patients have dizziness with low BOP, not with mildly elevated BP. I think it is worth evaluating other causes for her complaints, such as with a Neurology or ENT specialist.

## 2023-12-21 NOTE — Telephone Encounter (Signed)
Patient identification verified by 2 forms. Marilynn Rail, RN    Called and spoke to patient  Relayed provider message below  Patient states:   -she has seen ENT and Neurology regarding dizziness and they recommend follow up with Cardiology  -all other providers she has seen have stated BP can be the issue   -would like to know if it would be best replace irbesartan with different medications  -unsure if she should resume Carvedilol or Olmesartan   -Olmesartan was very upsetting to her stomach  -Unsure if she should continue taking the Metoprolol   -She has best BP when she was taking propranolol but this caused low HR and extreme dizziness   -would like BP numbers to decrease, wants further medication management   -also hoping to have improvement with dizziness  Informed patient message sent to Dr. Royann Shivers

## 2023-12-22 MED ORDER — AMLODIPINE BESYLATE 5 MG PO TABS: 5.0000 mg | ORAL_TABLET | Freq: Every day | ORAL | 2 refills | Status: DC

## 2023-12-22 NOTE — Telephone Encounter (Signed)
Called to update pt on Callie's message. Pt states that she thinks that her Fibromyalgia has flared up again. She will call PCP to discuss this. She just wanted to call and 'let you know'

## 2023-12-22 NOTE — Telephone Encounter (Signed)
Patient identification verified by 2 forms. Marilynn Rail, RN    Called and spoke to patient  Relayed provider message below  Patient states:   -Currently takes Amlodipine at 12pm, Irbesartan and Metoprolol at night   -does not want to take 5mg  Amlodipine at 12pm due to concerns for dizziness  -she will begin taking all BP medications at night starting at 6pm   -she will plan to take medications 1 hour apart   -she does not take metoprolol in the morning because it causes dizziness   -she has previously taken all medications at night time   -will discuss potentially changing Irbesartan at 2/18 OV  RN advised:   -could take Irbesartan at 12pm, Metoprolol in the morning and Amlodipine at night (patient declined)   -take BP before medication at 1-2hours after medications   -take BP in the mornings During call patient became frustrated regarding routine/schedule of medications. Patient was concerned about having increase dizziness and not having any BP medications in the morning. RN provided suggestions, patient declined. Patient stated she would take all medications at night time 1 hour apart, she will check BP in the morning.    Patient had no further questions at this time

## 2023-12-22 NOTE — Telephone Encounter (Signed)
We have made repeated adjustments to her blood pressure medications and we have never really been able to improve her dizziness.  That is why it seems unlikely to me that her dizziness is indeed just because of a blood pressure problem, especially since her blood pressure is not severely elevated. However I agree with her that this blood pressure is currently running too high.   Please confirm with her that she is indeed taking the following medications: Amlodipine 2.5 mg daily, irbesartan 75 mg daily, metoprolol succinate 25 mg daily. If that is accurate, please have her increase the amlodipine to 5 mg daily and send [place] a log of her blood pressure after about a week.

## 2023-12-23 NOTE — Telephone Encounter (Signed)
Thank you! Agree with following up with PCP.

## 2023-12-28 DIAGNOSIS — E559 Vitamin D deficiency, unspecified: Secondary | ICD-10-CM | POA: Diagnosis not present

## 2023-12-28 DIAGNOSIS — E782 Mixed hyperlipidemia: Secondary | ICD-10-CM | POA: Diagnosis not present

## 2023-12-28 DIAGNOSIS — R7303 Prediabetes: Secondary | ICD-10-CM | POA: Diagnosis not present

## 2024-01-04 DIAGNOSIS — F17200 Nicotine dependence, unspecified, uncomplicated: Secondary | ICD-10-CM | POA: Diagnosis not present

## 2024-01-04 DIAGNOSIS — E559 Vitamin D deficiency, unspecified: Secondary | ICD-10-CM | POA: Diagnosis not present

## 2024-01-04 DIAGNOSIS — E782 Mixed hyperlipidemia: Secondary | ICD-10-CM | POA: Diagnosis not present

## 2024-01-04 DIAGNOSIS — M797 Fibromyalgia: Secondary | ICD-10-CM | POA: Diagnosis not present

## 2024-01-04 DIAGNOSIS — I10 Essential (primary) hypertension: Secondary | ICD-10-CM | POA: Diagnosis not present

## 2024-01-04 DIAGNOSIS — R42 Dizziness and giddiness: Secondary | ICD-10-CM | POA: Diagnosis not present

## 2024-01-04 DIAGNOSIS — R946 Abnormal results of thyroid function studies: Secondary | ICD-10-CM | POA: Diagnosis not present

## 2024-01-18 ENCOUNTER — Encounter: Payer: Self-pay | Admitting: Cardiovascular Disease

## 2024-01-18 ENCOUNTER — Ambulatory Visit: Payer: Medicare Other | Attending: Cardiovascular Disease | Admitting: Cardiovascular Disease

## 2024-01-18 VITALS — BP 144/78 | HR 64 | Ht 60.0 in | Wt 102.4 lb

## 2024-01-18 DIAGNOSIS — T466X5D Adverse effect of antihyperlipidemic and antiarteriosclerotic drugs, subsequent encounter: Secondary | ICD-10-CM | POA: Diagnosis not present

## 2024-01-18 DIAGNOSIS — I10 Essential (primary) hypertension: Secondary | ICD-10-CM | POA: Diagnosis not present

## 2024-01-18 DIAGNOSIS — E7800 Pure hypercholesterolemia, unspecified: Secondary | ICD-10-CM | POA: Diagnosis not present

## 2024-01-18 DIAGNOSIS — G720 Drug-induced myopathy: Secondary | ICD-10-CM | POA: Diagnosis not present

## 2024-01-18 NOTE — Progress Notes (Unsigned)
 Cardiology office Note    Date:  01/19/2024   ID:  Kara Campos, DOB [birth year], MRN [id number]  PCP:  Irena Reichmann, DO  Cardiologist:  Thurmon Fair, MD  Electrophysiologist:  None   Evaluation Performed:  Follow-Up Visit  Chief Complaint:  HTN  History of Present Illness:    Kara Campos is a 88 y.o. female with longstanding mild essential hypertension, that has been difficult to treat due to numerous side effects, with frequent adjustments in her medical regimen.  She does have a very broad pulse pressure.  She is allergic to thiazide diuretics.  She is very lean with a BMI of only 20.  She has a history of statin myopathy.  After multiple changes I think we finally reached a regimen for her hypertension that she seems to tolerate well.  It is a combination of low doses of 3 different agents: Amlodipine 2.5 mg daily, irbesartan 75 mg daily, metoprolol succinate 25 mg daily.  She states that she feels better and has less dizziness after re-starting treatment with nortriptyline, but her PCP has recommended switching to Cymbalta.  She has not made this change yet.  On this regimen her blood pressure is mostly staying in the 130s-140s/70s although there is obviously some volatility.  Interestingly, after advice from one of her caregivers, she has noticed that her blood pressure is lower by about 10 points if she does not look at the monitor while she is having her blood pressure checked.  Her home blood pressure monitor is accurate compared with our sphygmomanometer, if she follows this precaution.  It has been a couple of years since her husband passed away and she continues to find it difficult to live alone.  She is thinking about moving to an independent living community.     Past Medical History:  Diagnosis Date   Arthritis    Blood transfusion    Cataract    Diabetes mellitus    borderline   Hemorrhoids    Hyperlipidemia    Osteoporosis    Past Surgical History:   Procedure Laterality Date   TOTAL HIP ARTHROPLASTY Left 02/13/2017   Procedure: TOTAL HIP ARTHROPLASTY ANTERIOR APPROACH;  Surgeon: Durene Romans, MD;  Location: [facility];  Service: Orthopedics;  Laterality: Left;   VAGINAL HYSTERECTOMY  1985     Current Meds  Medication Sig   amLODipine (NORVASC) 5 MG tablet Take 1 tablet (5 mg total) by mouth daily.   Cholecalciferol (VITAMIN D3) 50 MCG (2000 UT) TABS Take 2,000 Units by mouth every other day.   CLARITIN 10 MG tablet Take 10 mg by mouth daily as needed for allergies or rhinitis.   Cyanocobalamin (VITAMIN B-12 SL) Place 1 tablet under the tongue daily.   fish oil-omega-3 fatty acids 1000 MG capsule Take 1 g by mouth every other day.   fluticasone (FLONASE) 50 MCG/ACT nasal spray Place 1 spray into both nostrils 2 (two) times daily as needed for allergies or rhinitis.   irbesartan (AVAPRO) 75 MG tablet Take 1 tablet (75 mg total) by mouth daily.   Lactobacillus (PROBIOTIC ACIDOPHILUS PO) Take 1 capsule by mouth at bedtime.   magnesium gluconate (MAGONATE) 500 MG tablet Take 500 mg by mouth every other day.   metoprolol succinate (TOPROL XL) 25 MG 24 hr tablet Take 1 tablet (25 mg total) by mouth daily.   NON FORMULARY Take 1 tablet by mouth See admin instructions. [employer] [place] Tablets- Take 1 tablet by mouth at bedtime  as needed for sleep   nortriptyline (PAMELOR) 10 MG capsule Take 10 mg by mouth at bedtime.   sodium chloride (OCEAN) 0.65 % SOLN nasal spray Place 1 spray into both nostrils as needed for congestion.   timolol (BETIMOL) 0.5 % ophthalmic solution Place 1 drop into both eyes 2 (two) times daily.     Allergies:   Codeine, Nitrofurantoin macrocrystal, Sulfa antibiotics, and Tape   Social History   Tobacco Use   Smoking status: Every Day    Current packs/day: 0.50    Types: Cigarettes   Smokeless tobacco: Never   Tobacco comments:    01/18/2024 Patient smokes about a half a pack daily    10/26/2023 Patient smokes  about a fourth of a pack daily  Vaping Use   Vaping status: Never Used  Substance Use Topics   Alcohol use: Yes    Comment: Occas   Drug use: No     Family Hx: The patient's family history includes Ovarian cancer in her maternal grandmother.  ROS:   Please see the history of present illness.     All other systems reviewed and are negative.   Prior CV studies:   The following studies were reviewed today:    Labs/Other Tests and Data Reviewed:    EKG:   Recent Labs: 09/08/2023: ALT 18; BUN 19; Creatinine, Ser 0.51; Hemoglobin 14.6; Magnesium 2.1; Platelets 254; Potassium 3.6; Sodium 137  07/20/2018  Creatinine 0.62, potassium 4.4 11/10/2019  TSH 2.22 11/13/2020 TSH 2.89, creatinine 0.6, hemoglobin A1c 5.9% Recent Lipid Panel No results found for: "CHOL", "TRIG", "HDL", "CHOLHDL", "LDLCALC", "LDLDIRECT" 11/10/2019 HDL 80, triglycerides 205 16/08/9603 cholesterol 249, HDL 82, LDL 130, triglycerides 213 05/20/2021 cholesterol 225, HDL 73, triglycerides 166  Wt Readings from Last 3 Encounters:  01/18/24 102 lb 6.4 oz (46.4 kg)  12/07/23 103 lb (46.7 kg)  10/26/23 108 lb 3.2 oz (49.1 kg)     Objective:    Vital Signs:  BP (!) 144/78 (BP Location: Left Arm, Patient Position: Sitting, Cuff Size: Normal)   Pulse 64   Ht 5' (1.524 m)   Wt 102 lb 6.4 oz (46.4 kg)   SpO2 99%   BMI 20.00 kg/m     General: Alert, oriented x3, no distress, very lean Head: no evidence of trauma, PERRL, EOMI, no exophtalmos or lid lag, no myxedema, no xanthelasma; normal ears, nose and oropharynx Neck: normal jugular venous pulsations and no hepatojugular reflux; brisk carotid pulses without delay and no carotid bruits Chest: clear to auscultation, no signs of consolidation by percussion or palpation, normal fremitus, symmetrical and full respiratory excursions Cardiovascular: normal position and quality of the apical impulse, regular rhythm, normal first and second heart sounds, no murmurs,  rubs or gallops Abdomen: no tenderness or distention, no masses by palpation, no abnormal pulsatility or arterial bruits, normal bowel sounds, no hepatosplenomegaly Extremities: no clubbing, cyanosis or edema; 2+ radial, ulnar and brachial pulses bilaterally; 2+ right femoral, posterior tibial and dorsalis pedis pulses; 2+ left femoral, posterior tibial and dorsalis pedis pulses; no subclavian or femoral bruits Neurological: grossly nonfocal Psych: Normal mood and affect    ASSESSMENT & PLAN:    1. HTN: Will allow systolic blood pressure in the 140-150 range since otherwise she tends to have diastolic hypotension.  The current combination of very low-dose amlodipine, very low-dose irbesartan and very low-dose metoprolol seems to be working, after we have tried multiple other combinations. 2. HLP:   She does not have known CAD or  PAD.  She has a history of statin myopathy.  She does not want to try other lipid lowering agents.   Medication Changes: No orders of the defined types were placed in this encounter.  Patient Instructions  Medication Instructions:  Your physician recommends that you continue on your current medications as directed. Please refer to the Current Medication list given to you today.    *If you need a refill on your cardiac medications before your next appointment, please call your pharmacy*   Lab Work: None    If you have labs (blood work) drawn today and your tests are completely normal, you will receive your results only by: MyChart Message (if you have MyChart) OR A paper copy in the mail If you have any lab test that is abnormal or we need to change your treatment, we will call you to review the results.   Testing/Procedures: None    Follow-Up: At [facility], you and your health needs are our priority.  As part of our continuing mission to provide you with exceptional heart care, we have created designated Provider Care Teams.  These Care Teams  include your primary Cardiologist (physician) and Advanced Practice Providers (APPs -  Physician Assistants and Nurse Practitioners) who all work together to provide you with the care you need, when you need it.  We recommend signing up for the patient portal called "MyChart".  Sign up information is provided on this After Visit Summary.  MyChart is used to connect with patients for Virtual Visits (Telemedicine).  Patients are able to view lab/test results, encounter notes, upcoming appointments, etc.  Non-urgent messages can be sent to your provider as well.   To learn more about what you can do with MyChart, go to [email or web address].    Your next appointment:   1 year(s)  The format for your next appointment:   In Person  Provider:   Thurmon Fair, MD    Other Instructions  Signed, Thurmon Fair, MD  01/19/2024 4:53 PM    [facility]

## 2024-01-18 NOTE — Patient Instructions (Signed)
 Medication Instructions:  Your physician recommends that you continue on your current medications as directed. Please refer to the Current Medication list given to you today.    *If you need a refill on your cardiac medications before your next appointment, please call your pharmacy*   Lab Work: None    If you have labs (blood work) drawn today and your tests are completely normal, you will receive your results only by: MyChart Message (if you have MyChart) OR A paper copy in the mail If you have any lab test that is abnormal or we need to change your treatment, we will call you to review the results.   Testing/Procedures: None    Follow-Up: At [facility], you and your health needs are our priority.  As part of our continuing mission to provide you with exceptional heart care, we have created designated Provider Care Teams.  These Care Teams include your primary Cardiologist (physician) and Advanced Practice Providers (APPs -  Physician Assistants and Nurse Practitioners) who all work together to provide you with the care you need, when you need it.  We recommend signing up for the patient portal called "MyChart".  Sign up information is provided on this After Visit Summary.  MyChart is used to connect with patients for Virtual Visits (Telemedicine).  Patients are able to view lab/test results, encounter notes, upcoming appointments, etc.  Non-urgent messages can be sent to your provider as well.   To learn more about what you can do with MyChart, go to [email or web address].    Your next appointment:   1 year(s)  The format for your next appointment:   In Person  Provider:   Thurmon Fair, MD    Other Instructions

## 2024-01-19 MED ORDER — AMLODIPINE BESYLATE 5 MG PO TABS: 2.5000 mg | ORAL_TABLET | Freq: Every day | ORAL | Status: AC

## 2024-01-24 ENCOUNTER — Telehealth: Payer: Self-pay | Admitting: Cardiovascular Disease

## 2024-01-24 DIAGNOSIS — S52501A Unspecified fracture of the lower end of right radius, initial encounter for closed fracture: Secondary | ICD-10-CM | POA: Diagnosis not present

## 2024-01-24 NOTE — Telephone Encounter (Signed)
 Patient identification verified by 2 forms. Marilynn Rail, RN    Called and spoke to patient  Patient states:   -dizziness today is terrible   -dizziness occurs with and without position changes  -presented to office with concerns of dizziness   -unsure if would be best to see pharmacy, Dr. Royann Shivers or PA Irene Limbo regarding dizziness   -she saw Dr. Royann Shivers on 2/18 and he was not concerned about dizziness   -she is not sure what is stressing her and giving her a lot of anxiety  Patient denies:   -LOC or falls  Informed patient message sent for assistance  Patient verbalized understanding, no questions at this time

## 2024-01-24 NOTE — Telephone Encounter (Signed)
 Pt would like a c/b regarding her current medications. Pt states that he has been dizzy and off balance since 7/24. Pt thinks that it may be coming from one of her medications. Please advise

## 2024-01-25 NOTE — Telephone Encounter (Signed)
 Patient identification verified by 2 forms. Marilynn Rail, RN    Called and spoke to patient  Relayed provider message below  Patient states:   -does not wish to follow up with PCP or ENT   -she has followed up with ENT and multiple tests were done with no resolution   -PCP would like her to start valium but this has not helped dizziness   -would like pharmacy to review her medications and supplements that she takes   -would like pharmacy to see if what she is taking or how she takes them can cause her dizziness   -she will bring a paper into office with list of Rx and supplements   -she is unable to send mychart message  Informed patient:   -can bring list to office, when bringing list state it is for RN Brandalyn Harting   -per pharmacy they will review per priority and follow up   -no appointment needed for this review  Patient agrees with plan, no questions at this time

## 2024-01-25 NOTE — Telephone Encounter (Signed)
 I would advise checking in with her PCP or an ENT specialist.

## 2024-01-27 DIAGNOSIS — I671 Cerebral aneurysm, nonruptured: Secondary | ICD-10-CM | POA: Insufficient documentation

## 2024-01-27 NOTE — Telephone Encounter (Signed)
 Patient presented to office today with list for pharmacy to review  FWD to pharmacy for assistance

## 2024-01-27 NOTE — Telephone Encounter (Signed)
Follow Up:      Patient is returning call from today. 

## 2024-01-27 NOTE — Telephone Encounter (Signed)
 Campos, Kara, RPH  You; Croitoru, Mihai, MD45 minutes ago (10:26 AM)    If related to medications the most likely culprit would be nortriptylline. However she is on the lowest dose and takes at bedtime. Could try holding it to see if symptoms resolve. Other possibility would be metoprolol if her HR is dropping but she did not provide any vitals. Recommend following up with PCP  ___________________________________________________________________________  Attempted to call patient, no answer left message requesting a call back.

## 2024-01-27 NOTE — Telephone Encounter (Signed)
 Patient identification verified by 2 forms. Kara Rail, RN    Called and spoke to patient Relayed pharmacy message below  Patient states:   -she discontinued nortriptyline in July and it did not resolve dizziness  Advised patient to check heart rate during episode of dizziness and follow up with PCP  Patient agrees with plan, no questions at this time

## 2024-03-08 DIAGNOSIS — H401132 Primary open-angle glaucoma, bilateral, moderate stage: Secondary | ICD-10-CM | POA: Diagnosis not present

## 2024-03-08 DIAGNOSIS — H04123 Dry eye syndrome of bilateral lacrimal glands: Secondary | ICD-10-CM | POA: Diagnosis not present

## 2024-04-25 ENCOUNTER — Other Ambulatory Visit: Payer: Self-pay | Admitting: Student

## 2024-06-26 DIAGNOSIS — R946 Abnormal results of thyroid function studies: Secondary | ICD-10-CM | POA: Diagnosis not present

## 2024-06-26 DIAGNOSIS — E782 Mixed hyperlipidemia: Secondary | ICD-10-CM | POA: Diagnosis not present

## 2024-06-26 DIAGNOSIS — E559 Vitamin D deficiency, unspecified: Secondary | ICD-10-CM | POA: Diagnosis not present

## 2024-06-26 DIAGNOSIS — I10 Essential (primary) hypertension: Secondary | ICD-10-CM | POA: Diagnosis not present

## 2024-06-26 DIAGNOSIS — R7303 Prediabetes: Secondary | ICD-10-CM | POA: Diagnosis not present

## 2024-07-03 DIAGNOSIS — I10 Essential (primary) hypertension: Secondary | ICD-10-CM | POA: Diagnosis not present

## 2024-07-03 DIAGNOSIS — R7303 Prediabetes: Secondary | ICD-10-CM | POA: Diagnosis not present

## 2024-07-03 DIAGNOSIS — H409 Unspecified glaucoma: Secondary | ICD-10-CM | POA: Diagnosis not present

## 2024-07-03 DIAGNOSIS — E559 Vitamin D deficiency, unspecified: Secondary | ICD-10-CM | POA: Diagnosis not present

## 2024-07-03 DIAGNOSIS — K589 Irritable bowel syndrome without diarrhea: Secondary | ICD-10-CM | POA: Diagnosis not present

## 2024-07-03 DIAGNOSIS — E782 Mixed hyperlipidemia: Secondary | ICD-10-CM | POA: Diagnosis not present

## 2024-07-03 DIAGNOSIS — Z0000 Encounter for general adult medical examination without abnormal findings: Secondary | ICD-10-CM | POA: Diagnosis not present

## 2024-07-03 DIAGNOSIS — Z79899 Other long term (current) drug therapy: Secondary | ICD-10-CM | POA: Diagnosis not present

## 2024-07-27 DIAGNOSIS — H401132 Primary open-angle glaucoma, bilateral, moderate stage: Secondary | ICD-10-CM | POA: Diagnosis not present

## 2024-07-27 DIAGNOSIS — H04123 Dry eye syndrome of bilateral lacrimal glands: Secondary | ICD-10-CM | POA: Diagnosis not present

## 2024-07-27 DIAGNOSIS — H35373 Puckering of macula, bilateral: Secondary | ICD-10-CM | POA: Diagnosis not present

## 2024-07-27 DIAGNOSIS — Z961 Presence of intraocular lens: Secondary | ICD-10-CM | POA: Diagnosis not present

## 2024-08-05 DIAGNOSIS — M7062 Trochanteric bursitis, left hip: Secondary | ICD-10-CM | POA: Diagnosis not present

## 2024-08-05 DIAGNOSIS — Z96642 Presence of left artificial hip joint: Secondary | ICD-10-CM | POA: Diagnosis not present

## 2024-08-11 DIAGNOSIS — M5416 Radiculopathy, lumbar region: Secondary | ICD-10-CM | POA: Diagnosis not present

## 2024-08-23 ENCOUNTER — Other Ambulatory Visit: Payer: Self-pay | Admitting: Student

## 2024-09-06 DIAGNOSIS — M25552 Pain in left hip: Secondary | ICD-10-CM | POA: Diagnosis not present

## 2024-09-06 DIAGNOSIS — M5416 Radiculopathy, lumbar region: Secondary | ICD-10-CM | POA: Diagnosis not present

## 2024-09-19 DIAGNOSIS — M5416 Radiculopathy, lumbar region: Secondary | ICD-10-CM | POA: Diagnosis not present

## 2024-11-19 ENCOUNTER — Other Ambulatory Visit: Payer: Self-pay | Admitting: Student

## 2024-11-20 ENCOUNTER — Other Ambulatory Visit: Payer: Self-pay | Admitting: Student

## 2024-12-22 ENCOUNTER — Telehealth: Payer: Self-pay | Admitting: Cardiovascular Disease

## 2024-12-22 NOTE — Telephone Encounter (Signed)
 Pt c/o medication issue:  1. Name of Medication:   Meloxicam  2. How are you currently taking this medication (dosage and times per day)?  Have not started taking this medication  3. Are you having a reaction (difficulty breathing--STAT)?   4. What is your medication issue?   Patient stated her Orthopedic doctor prescribed this medication for inflammation due to arthritis and wants to know if this is OK for her to take.  Patient also wants to know when to take the medication - morning or evening.  Patient also wants suggestion for alternate medication.
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
## 2024-12-22 NOTE — Telephone Encounter (Signed)
 Can increase blood pressure but appears her orthopedic doctor already spoke with her about this. Patient can take to see if helps. Recommend shortest course possible.

## 2025-01-30 ENCOUNTER — Ambulatory Visit: Admitting: Cardiovascular Disease

## 2025-02-12 ENCOUNTER — Ambulatory Visit: Admitting: Cardiovascular Disease
